# Patient Record
Sex: Female | Born: 1955 | Race: White | Hispanic: No | Marital: Married | Smoking: Current every day smoker
Health system: Southern US, Community
[De-identification: ages and names within clinical notes are randomized; demographics above are authoritative.]

## PROBLEM LIST (undated history)

## (undated) DIAGNOSIS — I10 Essential (primary) hypertension: Secondary | ICD-10-CM

## (undated) DIAGNOSIS — I739 Peripheral vascular disease, unspecified: Secondary | ICD-10-CM

## (undated) DIAGNOSIS — K219 Gastro-esophageal reflux disease without esophagitis: Secondary | ICD-10-CM

## (undated) DIAGNOSIS — R7989 Other specified abnormal findings of blood chemistry: Secondary | ICD-10-CM

## (undated) DIAGNOSIS — E785 Hyperlipidemia, unspecified: Secondary | ICD-10-CM

## (undated) DIAGNOSIS — E7211 Homocystinuria: Secondary | ICD-10-CM

## (undated) HISTORY — DX: Peripheral vascular disease, unspecified: I73.9

## (undated) HISTORY — DX: Essential (primary) hypertension: I10

## (undated) HISTORY — DX: Gastro-esophageal reflux disease without esophagitis: K21.9

## (undated) HISTORY — DX: Hyperlipidemia, unspecified: E78.5

## (undated) HISTORY — PX: BREAST SURGERY: SHX581

## (undated) HISTORY — DX: Homocystinuria: E72.11

## (undated) HISTORY — PX: TUBAL LIGATION: SHX77

## (undated) HISTORY — DX: Other specified abnormal findings of blood chemistry: R79.89

## (undated) HISTORY — PX: CARDIAC VALVE REPLACEMENT: SHX585

---

## 1998-10-08 ENCOUNTER — Encounter: Payer: Self-pay | Admitting: Internal Medicine

## 1998-10-08 ENCOUNTER — Ambulatory Visit (HOSPITAL_COMMUNITY): Admission: RE | Admit: 1998-10-08 | Discharge: 1998-10-08 | Payer: Self-pay | Admitting: Internal Medicine

## 1999-10-01 ENCOUNTER — Other Ambulatory Visit: Admission: RE | Admit: 1999-10-01 | Discharge: 1999-10-01 | Payer: Self-pay | Admitting: Internal Medicine

## 2000-09-09 ENCOUNTER — Other Ambulatory Visit: Admission: RE | Admit: 2000-09-09 | Discharge: 2000-09-09 | Payer: Self-pay | Admitting: Radiology

## 2000-10-19 ENCOUNTER — Ambulatory Visit (HOSPITAL_BASED_OUTPATIENT_CLINIC_OR_DEPARTMENT_OTHER): Admission: RE | Admit: 2000-10-19 | Discharge: 2000-10-19 | Payer: Self-pay | Admitting: *Deleted

## 2000-10-19 ENCOUNTER — Encounter (INDEPENDENT_AMBULATORY_CARE_PROVIDER_SITE_OTHER): Payer: Self-pay | Admitting: *Deleted

## 2000-12-24 ENCOUNTER — Other Ambulatory Visit: Admission: RE | Admit: 2000-12-24 | Discharge: 2000-12-24 | Payer: Self-pay | Admitting: Obstetrics and Gynecology

## 2001-08-18 ENCOUNTER — Encounter: Payer: Self-pay | Admitting: Internal Medicine

## 2001-08-18 ENCOUNTER — Ambulatory Visit (HOSPITAL_COMMUNITY): Admission: RE | Admit: 2001-08-18 | Discharge: 2001-08-18 | Payer: Self-pay | Admitting: Internal Medicine

## 2002-07-18 ENCOUNTER — Other Ambulatory Visit: Admission: RE | Admit: 2002-07-18 | Discharge: 2002-07-18 | Payer: Self-pay | Admitting: Internal Medicine

## 2003-06-16 ENCOUNTER — Encounter: Admission: RE | Admit: 2003-06-16 | Discharge: 2003-06-16 | Payer: Self-pay | Admitting: Internal Medicine

## 2003-06-16 ENCOUNTER — Encounter: Payer: Self-pay | Admitting: Internal Medicine

## 2003-07-20 ENCOUNTER — Other Ambulatory Visit: Admission: RE | Admit: 2003-07-20 | Discharge: 2003-07-20 | Payer: Self-pay | Admitting: Internal Medicine

## 2004-09-23 ENCOUNTER — Other Ambulatory Visit: Admission: RE | Admit: 2004-09-23 | Discharge: 2004-09-23 | Payer: Self-pay | Admitting: Internal Medicine

## 2005-10-14 ENCOUNTER — Other Ambulatory Visit: Admission: RE | Admit: 2005-10-14 | Discharge: 2005-10-14 | Payer: Self-pay | Admitting: Internal Medicine

## 2006-05-13 ENCOUNTER — Encounter (INDEPENDENT_AMBULATORY_CARE_PROVIDER_SITE_OTHER): Payer: Self-pay | Admitting: *Deleted

## 2006-05-13 ENCOUNTER — Ambulatory Visit (HOSPITAL_BASED_OUTPATIENT_CLINIC_OR_DEPARTMENT_OTHER): Admission: RE | Admit: 2006-05-13 | Discharge: 2006-05-13 | Payer: Self-pay | Admitting: General Surgery

## 2006-05-13 ENCOUNTER — Encounter: Admission: RE | Admit: 2006-05-13 | Discharge: 2006-05-13 | Payer: Self-pay | Admitting: General Surgery

## 2006-11-23 ENCOUNTER — Other Ambulatory Visit: Admission: RE | Admit: 2006-11-23 | Discharge: 2006-11-23 | Payer: Self-pay | Admitting: Internal Medicine

## 2007-12-27 ENCOUNTER — Other Ambulatory Visit: Admission: RE | Admit: 2007-12-27 | Discharge: 2007-12-27 | Payer: Self-pay | Admitting: Internal Medicine

## 2008-08-07 ENCOUNTER — Ambulatory Visit: Payer: Self-pay | Admitting: Internal Medicine

## 2008-08-07 ENCOUNTER — Encounter: Admission: RE | Admit: 2008-08-07 | Discharge: 2008-08-07 | Payer: Self-pay | Admitting: Internal Medicine

## 2009-01-02 ENCOUNTER — Ambulatory Visit: Payer: Self-pay | Admitting: Internal Medicine

## 2009-01-02 ENCOUNTER — Other Ambulatory Visit: Admission: RE | Admit: 2009-01-02 | Discharge: 2009-01-02 | Payer: Self-pay | Admitting: Internal Medicine

## 2009-04-03 ENCOUNTER — Ambulatory Visit: Payer: Self-pay | Admitting: Internal Medicine

## 2009-07-24 ENCOUNTER — Ambulatory Visit: Payer: Self-pay | Admitting: Internal Medicine

## 2010-01-04 ENCOUNTER — Ambulatory Visit: Payer: Self-pay | Admitting: Internal Medicine

## 2010-07-15 ENCOUNTER — Ambulatory Visit: Payer: Self-pay | Admitting: Internal Medicine

## 2010-09-30 ENCOUNTER — Ambulatory Visit
Admission: RE | Admit: 2010-09-30 | Discharge: 2010-09-30 | Payer: Self-pay | Source: Home / Self Care | Attending: Internal Medicine | Admitting: Internal Medicine

## 2011-01-17 NOTE — Op Note (Signed)
Kristy Black, STONESIFER                ACCOUNT NO.:  [id number]   MEDICAL RECORD NO.:  [id number]          PATIENT TYPE:  AMB   LOCATION:  DSC                          FACILITY:  [facility]   PHYSICIAN:  Sharlet Salina T. Hoxworth, M.D.DATE OF BIRTH:  [birth year]   DATE OF PROCEDURE:  05/13/2006  DATE OF DISCHARGE:                                 OPERATIVE REPORT   PREOPERATIVE DIAGNOSIS:  Left breast mass.   POSTOPERATIVE DIAGNOSIS:  Left breast mass.   SURGICAL PROCEDURES:  Needle-localized left breast mass excision.   SURGEON:  Lorne Skeens. Hoxworth, M. D.   ANESTHESIA:  Local with IV sedation.   BRIEF HISTORY:  Regina Ganci is a 55 year old female who on a recent  screening mammogram was found to have a new small sub-1 cm mass in the upper  left breast.  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  With this pathology report, we recommended complete excision of  the lesion.  The nature of procedure, indications, risks of bleeding,  infection were discussed and understood.  She was then brought to the  operating room for this procedure.   DESCRIPTION OF OPERATION:  The patient brought to the operating room, placed  in supine position on the operating table following successful needle  localization.  The left breast was sterilely prepped and draped.  The  patient received preoperative antibiotic.  Correct patient and procedure  were verified.  Local anesthesia was used to infiltrate the skin and  underlying breast tissue.  I made a curvilinear incision at the upper  areolar edge and dissection was carried into the subcutaneous tissue.  Dissection was carried up toward the wire which was then brought into the  incision.  Following this a core of breast tissue was excised down around  the shaft of the wire using sharp dissection.  The breast tissue was very  dense but no discrete masses were encountered.  The specimen was removed and  sent for specimen radiograph  which showed the lesion well within the  specimen.  Hemostasis obtained with cautery.  The subcu was reapproximated  with interrupted 4-0 Monocryl.  Skin was closed with running subcuticular 4-  0 Monocryl and Steri-Strips.  Sponge and needle counts were correct.  Dry  sterile dressing applied.  The patient was taken to recovery in good  condition.      Lorne Skeens. Hoxworth, M.D.  Electronically Signed     BTH/MEDQ  D:  05/13/2006  T:  05/14/2006  Job:  [id number]

## 2011-01-17 NOTE — Op Note (Signed)
[place]  Patient:    MOANA, MUNFORD                       MRN: [id number] Proc. Date: 10/19/00 Adm. Date:  [id number] Attending:  Stephenie Acres                           Operative Report  PREOPERATIVE DIAGNOSIS:  Right breast mass.  POSTOPERATIVE DIAGNOSIS:  Right breast mass.  PROCEDURE:  Excisional right breast biopsy.  SURGEON:  Earna Coder, M.D.  ANESTHESIA:  Local MAC.  DESCRIPTION OF PROCEDURE:  The patient was taken to the operating room and placed in a supine position.  After adequate anesthesia was induced using MAC technique, the right breast was prepped and draped in normal sterile fashion. Using 1% lidocaine local anesthesia, the skin overlying the 6 oclock region of the right breast was anesthetized.  Using a curvilinear incision over the 6 oclock region of the right breast, the skin and subcutaneous tissue were anesthetized.  Dissected down, excising the palpable mass in its entirety. Adequate hemostasis was ensured, and the skin was closed with subcuticular 4-0 Monocryl.  A Dermabond dressing was placed.  The patient tolerated the procedure well and went to PACU in good condition. DD:  10/19/00 TD:  10/20/00 Job: [id number]

## 2011-01-17 NOTE — Op Note (Signed)
NAMENAVADA, OSTERHOUT                ACCOUNT NO.:  [id number]   MEDICAL RECORD NO.:  [id number]          PATIENT TYPE:  AMB   LOCATION:  DSC                          FACILITY:  [facility]   PHYSICIAN:  Sharlet Salina T. Hoxworth, M.D.DATE OF BIRTH:  July 04, 1956   DATE OF PROCEDURE:  DATE OF DISCHARGE:                                 OPERATIVE REPORT   Audio too short to transcribe (less than 5 seconds)      Sharlet Salina T. Hoxworth, M.D.     Tory Emerald  D:  05/13/2006  T:  05/13/2006  Job:  [id number]

## 2011-01-20 ENCOUNTER — Other Ambulatory Visit: Payer: 59 | Admitting: *Deleted

## 2011-01-20 DIAGNOSIS — Z0000 Encounter for general adult medical examination without abnormal findings: Secondary | ICD-10-CM

## 2011-01-20 DIAGNOSIS — I10 Essential (primary) hypertension: Secondary | ICD-10-CM

## 2011-01-20 LAB — LIPID PANEL
Cholesterol: 202 mg/dL — ABNORMAL HIGH (ref 0–200)
LDL Cholesterol: 128 mg/dL — ABNORMAL HIGH (ref 0–99)
Total CHOL/HDL Ratio: 6.3 Ratio
VLDL: 42 mg/dL — ABNORMAL HIGH (ref 0–40)

## 2011-01-20 LAB — COMPREHENSIVE METABOLIC PANEL
ALT: 20 U/L (ref 0–35)
AST: 22 U/L (ref 0–37)
Alkaline Phosphatase: 99 U/L (ref 39–117)
BUN: 7 mg/dL (ref 6–23)
Creat: 0.66 mg/dL (ref 0.40–1.20)

## 2011-01-20 LAB — CBC WITH DIFFERENTIAL/PLATELET
Basophils Absolute: 0 10*3/uL (ref 0.0–0.1)
Eosinophils Absolute: 0.1 10*3/uL (ref 0.0–0.7)
Eosinophils Relative: 1 % (ref 0–5)
Lymphocytes Relative: 27 % (ref 12–46)
Lymphs Abs: 3.7 10*3/uL (ref 0.7–4.0)
MCH: 29 pg (ref 26.0–34.0)
MCV: 86.6 fL (ref 78.0–100.0)
Neutrophils Relative %: 66 % (ref 43–77)
Platelets: 375 10*3/uL (ref 150–400)
RBC: 5.38 MIL/uL — ABNORMAL HIGH (ref 3.87–5.11)
RDW: 15.5 % (ref 11.5–15.5)
WBC: 13.7 10*3/uL — ABNORMAL HIGH (ref 4.0–10.5)

## 2011-01-21 ENCOUNTER — Encounter: Payer: Self-pay | Admitting: Internal Medicine

## 2011-01-21 ENCOUNTER — Ambulatory Visit (INDEPENDENT_AMBULATORY_CARE_PROVIDER_SITE_OTHER): Payer: 59 | Admitting: Internal Medicine

## 2011-01-21 VITALS — BP 124/84 | HR 80 | Temp 99.2°F | Ht 67.0 in | Wt 177.0 lb

## 2011-01-21 DIAGNOSIS — Z0000 Encounter for general adult medical examination without abnormal findings: Secondary | ICD-10-CM

## 2011-01-21 DIAGNOSIS — E785 Hyperlipidemia, unspecified: Secondary | ICD-10-CM

## 2011-01-21 DIAGNOSIS — I10 Essential (primary) hypertension: Secondary | ICD-10-CM

## 2011-01-21 LAB — POCT URINALYSIS DIPSTICK
Bilirubin, UA: NEGATIVE
Glucose, UA: NEGATIVE
Ketones, UA: NEGATIVE
Spec Grav, UA: 1.01
pH, UA: 6.5

## 2011-01-21 LAB — VITAMIN D 25 HYDROXY (VIT D DEFICIENCY, FRACTURES): Vit D, 25-Hydroxy: 38 ng/mL (ref 30–89)

## 2011-01-21 NOTE — Patient Instructions (Signed)
Take current meds as directed. Let us know when you would like to have colonoscopy referral. Take Zantac 150 mg over the counter twice a day and continue Miralax. Return in 6 months. Diet and exercise. Lose weight.

## 2011-01-21 NOTE — Progress Notes (Signed)
  Subjective:    Patient ID: Kristy Black, female    DOB: 02-22-1956, 55 y.o.   MRN: [id number]  HPI55 year old White female for Health maintenance.  Exam and evaluation of medical problems. Hx HTN, mild hyperlipidemia, constipation, GE reflux.     Review of Systems  Constitutional: Negative for fever.  HENT: Negative for hearing loss, congestion, sneezing and postnasal drip.   Respiratory: Positive for chest tightness. Negative for shortness of breath.        Has c/o chest pain but has been doing yard work. Smokes 1/2ppd and does not want to quit.  Gastrointestinal: Positive for constipation.       C/o abd bloating after eating. Takes Mirlax daily  Genitourinary: Negative for dysuria, enuresis and difficulty urinating.       Hot flashes q 2 hours hs  Neurological: Negative for seizures, syncope and headaches.  Hematological: Negative.   Psychiatric/Behavioral: The patient is nervous/anxious.        Objective:   Physical Exam  Constitutional: She is oriented to person, place, and time. She appears well-nourished. No distress.  HENT:  Head: Normocephalic and atraumatic.  Right Ear: External ear normal.  Left Ear: External ear normal.  Nose: Nose normal.  Mouth/Throat: No oropharyngeal exudate.  Eyes: Conjunctivae and EOM are normal. Pupils are equal, round, and reactive to light.  Neck: Normal range of motion. Neck supple. No JVD present. No thyromegaly present.  Cardiovascular: Normal rate, regular rhythm and normal heart sounds.   No murmur heard. Pulmonary/Chest: Breath sounds normal. No respiratory distress. She has no rales. She exhibits tenderness.  Abdominal: She exhibits no distension and no mass. There is no tenderness. There is no rebound and no guarding.  Genitourinary: Vagina normal and uterus normal.  Musculoskeletal: She exhibits no edema.  Lymphadenopathy:    She has no cervical adenopathy.  Neurological: She is alert and oriented to person, place, and time.  She has normal reflexes. She displays normal reflexes. No cranial nerve deficit. She exhibits normal muscle tone.  Skin: Skin is warm and dry. No rash noted. No pallor.  Psychiatric: She has a normal mood and affect. Her behavior is normal. Judgment and thought content normal.          Assessment & Plan:  1-HTN- stable on current regimen but admits 1/2 dose of each bp med 2-Mild hyperlipidemia-does not want to be on statin 3-Constipation-controlled with Miralax 3- Chest wall pain-reassure pt 4-Abd bloating after meals- could be reflux, types of foods eaten,anxiety,constipation. Rec Zantac 150 mg bid and cut out foods that cause bloating like nuts and certain cereals RTC in 6 months for BP check and OV

## 2011-06-29 ENCOUNTER — Other Ambulatory Visit: Payer: Self-pay | Admitting: Internal Medicine

## 2011-07-28 ENCOUNTER — Other Ambulatory Visit: Payer: 59 | Admitting: Internal Medicine

## 2011-07-28 DIAGNOSIS — E785 Hyperlipidemia, unspecified: Secondary | ICD-10-CM

## 2011-07-28 LAB — LIPID PANEL
Cholesterol: 220 mg/dL — ABNORMAL HIGH (ref 0–200)
HDL: 36 mg/dL — ABNORMAL LOW (ref >39)
Total CHOL/HDL Ratio: 6.1 Ratio
Triglycerides: 223 mg/dL — ABNORMAL HIGH (ref <150)
VLDL: 45 mg/dL — ABNORMAL HIGH (ref 0–40)

## 2011-07-29 ENCOUNTER — Encounter: Payer: Self-pay | Admitting: Internal Medicine

## 2011-07-29 ENCOUNTER — Ambulatory Visit (INDEPENDENT_AMBULATORY_CARE_PROVIDER_SITE_OTHER): Payer: 59 | Admitting: Internal Medicine

## 2011-07-29 VITALS — BP 136/88 | HR 76 | Temp 98.0°F | Wt 178.0 lb

## 2011-07-29 DIAGNOSIS — I10 Essential (primary) hypertension: Secondary | ICD-10-CM

## 2011-07-29 DIAGNOSIS — K5900 Constipation, unspecified: Secondary | ICD-10-CM

## 2011-07-29 DIAGNOSIS — E785 Hyperlipidemia, unspecified: Secondary | ICD-10-CM

## 2011-07-29 DIAGNOSIS — Z87891 Personal history of nicotine dependence: Secondary | ICD-10-CM

## 2011-07-29 NOTE — Progress Notes (Signed)
  Subjective:    Patient ID: Kristy Black, female    DOB: 06-29-1956, 55 y.o.   MRN: [id number]  HPI 55 year old white female with hypertension and hyperlipidemia. Currently hyperlipidemia is diet controlled at her request. However it has worsened a bit post-Thanksgiving. Also her blood pressure is not as well-controlled as I would like to see at 136/88. She currently is taking one half of Toprol 50 mg every morning and one half of HCTZ 25 mg every morning. She needs to take a whole HCTZ 25 mg tablet and one half of metoprolol 50 mg every morning and perhaps another half of Toprol 50 mg at bedtime. She needs to continue monitoring her blood pressure and keep me posted. For now were going to give her a chance to get back on a strict diet. Says she doesn't have any energy to exercise. Smoking three quarters pack of cigarettes daily. Continues to feel bloated in her abdomen. Doesn't take MiraLAX daily as I have suggested. MiraLAX does relieve some of the bloating when she takes it. 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 See above recommendations for blood pressure control taking 25 mg HCTZ daily instead of 12.5 mg daily. Continue 25 mg of Toprol every morning and perhaps add 25 mg at bedtime. Return in 6 months. Strict diet and exercise for hyperlipidemia control and reassess in 6 months. She is not ready to quit smoking.

## 2011-07-29 NOTE — Patient Instructions (Signed)
Take HCTZ 25 mg daily. Continue with Toprol 50 mg one half tablet daily every morning and perhaps add one half tablet at bedtime if blood pressure does not come down to approximately 120/80 with increasing diuretic dose. Return in 6 months. Strict diet and exercise regimen to control hyperlipidemia and will reevaluate in 6 months.

## 2011-09-23 ENCOUNTER — Other Ambulatory Visit: Payer: Self-pay | Admitting: Internal Medicine

## 2012-01-27 ENCOUNTER — Other Ambulatory Visit: Payer: 59 | Admitting: Internal Medicine

## 2012-01-27 DIAGNOSIS — Z0000 Encounter for general adult medical examination without abnormal findings: Secondary | ICD-10-CM

## 2012-01-27 LAB — COMPREHENSIVE METABOLIC PANEL
Alkaline Phosphatase: 97 U/L (ref 39–117)
Glucose, Bld: 98 mg/dL (ref 70–99)
Sodium: 140 mEq/L (ref 135–145)
Total Bilirubin: 0.5 mg/dL (ref 0.3–1.2)
Total Protein: 6.9 g/dL (ref 6.0–8.3)

## 2012-01-27 LAB — LIPID PANEL
Cholesterol: 197 mg/dL (ref 0–200)
HDL: 30 mg/dL — ABNORMAL LOW (ref >39)
Total CHOL/HDL Ratio: 6.6 Ratio
Triglycerides: 162 mg/dL — ABNORMAL HIGH (ref <150)
VLDL: 32 mg/dL (ref 0–40)

## 2012-01-27 LAB — CBC WITH DIFFERENTIAL/PLATELET
Basophils Relative: 0 % (ref 0–1)
Eosinophils Absolute: 0.1 10*3/uL (ref 0.0–0.7)
MCH: 28.5 pg (ref 26.0–34.0)
MCHC: 33.4 g/dL (ref 30.0–36.0)
Monocytes Relative: 6 % (ref 3–12)
Neutrophils Relative %: 64 % (ref 43–77)
Platelets: 404 10*3/uL — ABNORMAL HIGH (ref 150–400)

## 2012-01-29 ENCOUNTER — Encounter: Payer: Self-pay | Admitting: Internal Medicine

## 2012-01-29 ENCOUNTER — Other Ambulatory Visit (HOSPITAL_COMMUNITY)
Admission: RE | Admit: 2012-01-29 | Discharge: 2012-01-29 | Disposition: A | Discharge status: Home / Self Care | Payer: 59 | Source: Ambulatory Visit | Attending: Internal Medicine | Admitting: Internal Medicine

## 2012-01-29 ENCOUNTER — Ambulatory Visit (INDEPENDENT_AMBULATORY_CARE_PROVIDER_SITE_OTHER): Payer: 59 | Admitting: Internal Medicine

## 2012-01-29 VITALS — BP 136/76 | HR 72 | Temp 97.5°F | Ht 67.25 in | Wt 172.5 lb

## 2012-01-29 DIAGNOSIS — N6019 Diffuse cystic mastopathy of unspecified breast: Secondary | ICD-10-CM

## 2012-01-29 DIAGNOSIS — I10 Essential (primary) hypertension: Secondary | ICD-10-CM

## 2012-01-29 DIAGNOSIS — Z0000 Encounter for general adult medical examination without abnormal findings: Secondary | ICD-10-CM

## 2012-01-29 DIAGNOSIS — Z124 Encounter for screening for malignant neoplasm of cervix: Secondary | ICD-10-CM

## 2012-01-29 DIAGNOSIS — Z01419 Encounter for gynecological examination (general) (routine) without abnormal findings: Secondary | ICD-10-CM | POA: Insufficient documentation

## 2012-01-29 DIAGNOSIS — Z87891 Personal history of nicotine dependence: Secondary | ICD-10-CM

## 2012-01-29 DIAGNOSIS — K219 Gastro-esophageal reflux disease without esophagitis: Secondary | ICD-10-CM

## 2012-01-29 LAB — POCT URINALYSIS DIPSTICK
Ketones, UA: NEGATIVE
Protein, UA: NEGATIVE
Spec Grav, UA: <=1.005
pH, UA: 7

## 2012-02-01 DIAGNOSIS — Z87891 Personal history of nicotine dependence: Secondary | ICD-10-CM | POA: Insufficient documentation

## 2012-02-01 DIAGNOSIS — N6019 Diffuse cystic mastopathy of unspecified breast: Secondary | ICD-10-CM | POA: Insufficient documentation

## 2012-02-01 DIAGNOSIS — K219 Gastro-esophageal reflux disease without esophagitis: Secondary | ICD-10-CM | POA: Insufficient documentation

## 2012-02-01 NOTE — Patient Instructions (Signed)
Continue same medications and return in 6 months for office visit blood pressure check. Consider colonoscopy. Please do 3 Hemoccult cards.

## 2012-02-01 NOTE — Progress Notes (Signed)
Subjective:    Patient ID: Kristy Black, female    DOB: 05-19-1956, 56 y.o.   MRN: [id number]  HPI 56 year old white female with history of hypertension and hyperlipidemia in today for health maintenance exam and evaluation of medical problems. Also has history of GE reflux and constipation. Medications include Toprol, HCTZ, over-the-counter medication for GE reflux. Blood pressure is fairly well controlled. Patient has some history of anxiety. She's had a difficult time raising her daughter. Daughter got involved with a young man who had a drug problem and she became a drug abuser is well. She is currently back home attending school and doing much better.  Social history: Patient married has one daughter. She is employed by [employer]. Is eligible to retire soon. 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 Patient has smoked for over 20 years. Does not consume alcohol.  Family history father with history of diabetes coronary artery disease and hypertension  Mother with history of dementia and hypertension. 2 brothers and 2 sisters. One sister died at age 76 of promyelocytic leukemia.  Additional history: Patient had left fibroadenoma removed in 1985, right fibroadenoma removed 2000. C-section 1991. Bilateral tubal ligation 1995. Benign left breast biopsy in September 2007. Had tetanus immunization through the health department in the fall of 2012.    Review of Systems  Constitutional: Negative.   HENT: Negative.   Eyes: Negative.   Respiratory: Negative.   Cardiovascular: Negative.   Gastrointestinal: Negative.        History of constipation and GE reflux  Genitourinary: Negative.   Musculoskeletal: Negative.   Neurological: Negative.   Hematological: Negative.   Psychiatric/Behavioral: Negative.        Objective:   Physical Exam  Vitals reviewed. Constitutional: She is oriented to person, place, and time. She appears well-developed  and well-nourished. No distress.  HENT:  Head: Normocephalic and atraumatic.  Right Ear: External ear normal.  Left Ear: External ear normal.  Nose: Nose normal.  Mouth/Throat: Oropharynx is clear and moist.  Eyes: Conjunctivae and EOM are normal. Pupils are equal, round, and reactive to light. No scleral icterus.  Neck: Neck supple. No JVD present. No thyromegaly present.  Cardiovascular: Normal rate, regular rhythm, normal heart sounds and intact distal pulses.   No murmur heard. Pulmonary/Chest: Effort normal and breath sounds normal. She has no wheezes. She has no rales.       Breasts normal female  Abdominal: Soft. Bowel sounds are normal. She exhibits no distension and no mass. There is no tenderness. There is no rebound and no guarding.  Genitourinary: Vagina normal and uterus normal.       Pap taken  Musculoskeletal: Normal range of motion. She exhibits no edema and no tenderness.  Lymphadenopathy:    She has no cervical adenopathy.  Neurological: She is alert and oriented to person, place, and time. She has normal reflexes. No cranial nerve deficit. Coordination normal.  Skin: Skin is warm and dry. She is not diaphoretic.  Psychiatric: She has a normal mood and affect. Her behavior is normal. Judgment and thought content normal.          Assessment & Plan:  Hypertension  Increased white blood cell count noted on CBC of 14,000. Sister had history of promyelocytic leukemia. Repeat white blood cell count with peripheral review of smear by hematology staff in 4-6 weeks.  GE reflux  Mild hyperlipidemia  History of constipation  History of fibrocystic breast disease  History of smoking  Plan: Continue same medications return in 6 months for office visit blood pressure check. Also repeat lipid panel at that time.

## 2012-02-17 ENCOUNTER — Other Ambulatory Visit: Payer: Self-pay | Admitting: Internal Medicine

## 2012-02-17 ENCOUNTER — Other Ambulatory Visit: Payer: 59 | Admitting: Internal Medicine

## 2012-02-17 DIAGNOSIS — D72829 Elevated white blood cell count, unspecified: Secondary | ICD-10-CM

## 2012-02-17 LAB — CBC WITH DIFFERENTIAL/PLATELET
Basophils Absolute: 0 10*3/uL (ref 0.0–0.1)
Basophils Relative: 0 % (ref 0–1)
Eosinophils Relative: 1 % (ref 0–5)
HCT: 44.2 % (ref 36.0–46.0)
MCH: 28.5 pg (ref 26.0–34.0)
MCHC: 34.2 g/dL (ref 30.0–36.0)
MCV: 83.4 fL (ref 78.0–100.0)
Monocytes Absolute: 0.9 10*3/uL (ref 0.1–1.0)
RDW: 14.6 % (ref 11.5–15.5)

## 2012-02-26 NOTE — Progress Notes (Signed)
Patient informed. 

## 2012-02-27 ENCOUNTER — Other Ambulatory Visit: Payer: 59 | Admitting: Internal Medicine

## 2012-07-28 ENCOUNTER — Other Ambulatory Visit: Payer: Self-pay | Admitting: Internal Medicine

## 2012-08-02 ENCOUNTER — Other Ambulatory Visit: Payer: Self-pay | Admitting: Internal Medicine

## 2012-08-02 ENCOUNTER — Other Ambulatory Visit: Payer: 59 | Admitting: Internal Medicine

## 2012-08-02 DIAGNOSIS — Z79899 Other long term (current) drug therapy: Secondary | ICD-10-CM

## 2012-08-02 DIAGNOSIS — E785 Hyperlipidemia, unspecified: Secondary | ICD-10-CM

## 2012-08-02 LAB — LIPID PANEL
Cholesterol: 208 mg/dL — ABNORMAL HIGH (ref 0–200)
LDL Cholesterol: 138 mg/dL — ABNORMAL HIGH (ref 0–99)
Total CHOL/HDL Ratio: 6.1 Ratio
VLDL: 36 mg/dL (ref 0–40)

## 2012-08-02 LAB — HEPATIC FUNCTION PANEL
ALT: 19 U/L (ref 0–35)
Bilirubin, Direct: 0.1 mg/dL (ref 0.0–0.3)
Indirect Bilirubin: 0.4 mg/dL (ref 0.0–0.9)
Total Bilirubin: 0.5 mg/dL (ref 0.3–1.2)

## 2012-08-03 ENCOUNTER — Encounter: Payer: Self-pay | Admitting: Internal Medicine

## 2012-08-03 ENCOUNTER — Ambulatory Visit (INDEPENDENT_AMBULATORY_CARE_PROVIDER_SITE_OTHER): Payer: 59 | Admitting: Internal Medicine

## 2012-08-03 VITALS — BP 134/82 | HR 80 | Temp 96.9°F | Wt 175.0 lb

## 2012-08-03 DIAGNOSIS — E785 Hyperlipidemia, unspecified: Secondary | ICD-10-CM

## 2012-08-03 DIAGNOSIS — D72829 Elevated white blood cell count, unspecified: Secondary | ICD-10-CM

## 2012-08-03 DIAGNOSIS — I10 Essential (primary) hypertension: Secondary | ICD-10-CM

## 2012-08-03 DIAGNOSIS — Z23 Encounter for immunization: Secondary | ICD-10-CM

## 2012-08-03 DIAGNOSIS — Z87891 Personal history of nicotine dependence: Secondary | ICD-10-CM

## 2012-08-03 LAB — CBC WITH DIFFERENTIAL/PLATELET
Eosinophils Absolute: 0.1 10*3/uL (ref 0.0–0.7)
Eosinophils Relative: 1 % (ref 0–5)
HCT: 47.1 % — ABNORMAL HIGH (ref 36.0–46.0)
Hemoglobin: 16.3 g/dL — ABNORMAL HIGH (ref 12.0–15.0)
Lymphocytes Relative: 34 % (ref 12–46)
Lymphs Abs: 4 10*3/uL (ref 0.7–4.0)
MCH: 29.6 pg (ref 26.0–34.0)
MCV: 85.5 fL (ref 78.0–100.0)
Monocytes Relative: 5 % (ref 3–12)
Platelets: 381 10*3/uL (ref 150–400)
RBC: 5.51 MIL/uL — ABNORMAL HIGH (ref 3.87–5.11)
WBC: 11.6 10*3/uL — ABNORMAL HIGH (ref 4.0–10.5)

## 2012-08-03 NOTE — Patient Instructions (Addendum)
Watch diet and try to exercise. Consider smoking cessation. Return in 6 months for physical exam. Flu vaccine given today.

## 2012-08-03 NOTE — Progress Notes (Signed)
  Subjective:    Patient ID: Kristy Black, female    DOB: 04/09/1956, 56 y.o.   MRN: [id number]  HPI 56 year old white female smoker in today for six-month recheck of hypertension and hyperlipidemia. 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 However triglycerides have increased just a bit as well as total cholesterol. Blood pressure is under good control on current regimen of one half Toprol daily. Continues to smoke and is not interested in quitting. Has not had influenza immunization so this was done today.  Also she has over the past few years developed a leukocytosis which is unexplained. We've been following it and it runs in the 13,000 range. At last visit we had path review smear and no significant abnormalities were noted. We are going to repeat CBC with differential today.  Total cholesterol is 208 and previously was 197   6 months ago. Triglycerides are 180 and previously were    6 months ago. LDL cholesterol is 138 and previously was 135   6 months ago.    Review of Systems     Objective:   Physical Exam chest is clear to auscultation; cardiac exam regular rate and rhythm; extremities without edema; skin is warm and dry.        Assessment & Plan:  Hyperlipidemia would prefer this to have triglycerides less than 150 and total cholesterol under 200.  Hypertension-would prefer patient to call Toprol 50 mg tablet daily but she prefers to take half tablet daily.  History of smoking-patient not willing to quit  Health maintenance-influenza vaccine given today  Leukocytosis-CBC with differential repeated today  Plan: If white count remains elevated, consider referral to hematology for evaluation. There is no evidence of any sort of infectious process going on . Otherwise return in 6 months for physical examination.

## 2012-08-04 DIAGNOSIS — Z23 Encounter for immunization: Secondary | ICD-10-CM

## 2012-10-09 ENCOUNTER — Other Ambulatory Visit: Payer: Self-pay | Admitting: Internal Medicine

## 2012-10-11 ENCOUNTER — Other Ambulatory Visit: Payer: Self-pay

## 2012-10-11 MED ORDER — METOPROLOL SUCCINATE ER 50 MG PO TB24: 50.0000 mg | ORAL_TABLET | Freq: Every day | ORAL | Status: DC

## 2012-11-22 ENCOUNTER — Other Ambulatory Visit: Payer: Self-pay | Admitting: Internal Medicine

## 2013-01-15 ENCOUNTER — Other Ambulatory Visit: Payer: Self-pay | Admitting: Internal Medicine

## 2013-02-03 ENCOUNTER — Other Ambulatory Visit: Payer: Self-pay | Admitting: Internal Medicine

## 2013-02-03 DIAGNOSIS — Z1329 Encounter for screening for other suspected endocrine disorder: Secondary | ICD-10-CM

## 2013-02-03 DIAGNOSIS — Z0000 Encounter for general adult medical examination without abnormal findings: Secondary | ICD-10-CM

## 2013-02-03 DIAGNOSIS — Z13220 Encounter for screening for lipoid disorders: Secondary | ICD-10-CM

## 2013-02-03 DIAGNOSIS — Z130 Encounter for screening for diseases of the blood and blood-forming organs and certain disorders involving the immune mechanism: Secondary | ICD-10-CM

## 2013-02-03 LAB — LIPID PANEL
HDL: 28 mg/dL — ABNORMAL LOW (ref >39)
LDL Cholesterol: 97 mg/dL (ref 0–99)
Triglycerides: 166 mg/dL — ABNORMAL HIGH (ref <150)
VLDL: 33 mg/dL (ref 0–40)

## 2013-02-03 LAB — CBC WITH DIFFERENTIAL/PLATELET
Basophils Relative: 0 % (ref 0–1)
Eosinophils Absolute: 0.2 10*3/uL (ref 0.0–0.7)
Eosinophils Relative: 2 % (ref 0–5)
HCT: 44.1 % (ref 36.0–46.0)
Hemoglobin: 15.4 g/dL — ABNORMAL HIGH (ref 12.0–15.0)
MCH: 29.2 pg (ref 26.0–34.0)
MCHC: 34.9 g/dL (ref 30.0–36.0)
Monocytes Absolute: 1 10*3/uL (ref 0.1–1.0)
Monocytes Relative: 7 % (ref 3–12)

## 2013-02-03 LAB — COMPREHENSIVE METABOLIC PANEL
Alkaline Phosphatase: 100 U/L (ref 39–117)
BUN: 10 mg/dL (ref 6–23)
Glucose, Bld: 91 mg/dL (ref 70–99)
Total Bilirubin: 0.5 mg/dL (ref 0.3–1.2)

## 2013-02-04 ENCOUNTER — Encounter: Payer: Self-pay | Admitting: Internal Medicine

## 2013-02-04 ENCOUNTER — Ambulatory Visit (INDEPENDENT_AMBULATORY_CARE_PROVIDER_SITE_OTHER): Payer: 59 | Admitting: Internal Medicine

## 2013-02-04 VITALS — BP 110/82 | HR 72 | Ht 67.25 in | Wt 173.0 lb

## 2013-02-04 DIAGNOSIS — Z0000 Encounter for general adult medical examination without abnormal findings: Secondary | ICD-10-CM

## 2013-02-04 DIAGNOSIS — K5900 Constipation, unspecified: Secondary | ICD-10-CM

## 2013-02-04 DIAGNOSIS — N6019 Diffuse cystic mastopathy of unspecified breast: Secondary | ICD-10-CM

## 2013-02-04 DIAGNOSIS — E785 Hyperlipidemia, unspecified: Secondary | ICD-10-CM

## 2013-02-04 DIAGNOSIS — Z87891 Personal history of nicotine dependence: Secondary | ICD-10-CM

## 2013-02-04 DIAGNOSIS — K219 Gastro-esophageal reflux disease without esophagitis: Secondary | ICD-10-CM

## 2013-02-04 DIAGNOSIS — I10 Essential (primary) hypertension: Secondary | ICD-10-CM

## 2013-02-04 NOTE — Patient Instructions (Addendum)
Continue same meds and return in one year. Please stop smoking. Take Miralax for constipation.

## 2013-02-04 NOTE — Progress Notes (Signed)
Subjective:    Patient ID: Kristy Black, female    DOB: [birth year], 57 y.o.   MRN: [id number]  HPI 57 year old White Female with history of hypertension in today for health maintenance and evaluation of medical issues. Patient continues to smoke. Smoking cessation counseling given. 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 Blood pressure is under good control with HCTZ and metoprolol. History of GE reflux. History of hyperlipidemia. Patient has history of anxiety. She's had a difficult time raising her daughter. Her daughter is now in her 59s and while a teenager got involved with a young man who had a drug problem and she became a drug abuser is well. Daughter has been to the [facility] and is on detox therapy. Daughter is working at [employer]. Patient has history of functional constipation.  Past medical history: Patient had left breast fibroadenoma removed in 1985, right breast fibroadenoma removed in 2000. C-section 1991. Bilateral tubal ligation 1995. Benign left breast biopsy September 2007.  Tetanus immunization done through the health department in the fall of 2012.  Social history: She is married and has one daughter. 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 Has smoked for well over 20 years. Does not consume alcohol.  Family history: Father with history of diabetes, coronary artery disease, hypertension. Mother with history of dementia and hypertension. 2 brothers and 2 sisters. One sister died at age 94 of promyelocytic leukemia.    Review of Systems  Constitutional: Negative.   HENT: Negative.   Eyes: Negative.   Gastrointestinal:       History of functional constipation  Endocrine: Negative.   Genitourinary: Negative.   Allergic/Immunologic: Negative.   Neurological: Negative.   Hematological: Negative.   Psychiatric/Behavioral: Negative.        Objective:   Physical Exam  Constitutional: She is  oriented to person, place, and time. She appears well-developed and well-nourished. No distress.  HENT:  Head: Normocephalic and atraumatic.  Right Ear: External ear normal.  Left Ear: External ear normal.  Mouth/Throat: Oropharynx is clear and moist. No oropharyngeal exudate.  Eyes: Conjunctivae and EOM are normal. Pupils are equal, round, and reactive to light. Right eye exhibits no discharge. Left eye exhibits no discharge. No scleral icterus.  Neck: Neck supple. No JVD present. No thyromegaly present.  Cardiovascular: Normal rate, regular rhythm, normal heart sounds and intact distal pulses.   No murmur heard. Pulmonary/Chest: Effort normal and breath sounds normal. No respiratory distress. She has no wheezes. She has no rales.  Abdominal: Soft. Bowel sounds are normal. She exhibits no distension and no mass. There is no rebound and no guarding.  Genitourinary:  Pap done 2013. Bimanual is normal.  Musculoskeletal: Normal range of motion. She exhibits no edema.  Lymphadenopathy:    She has no cervical adenopathy.  Neurological: She is alert and oriented to person, place, and time. She has normal reflexes. No cranial nerve deficit. Coordination normal.  Skin: Skin is warm and dry. No rash noted. She is not diaphoretic.  Psychiatric: She has a normal mood and affect. Her behavior is normal. Judgment and thought content normal.          Assessment & Plan:  Hypertension-stable on HCTZ and beta blocker- continue same medication and return in 6 months  History smoking-smoking cessation counseling given.  Hyperlipidemia-patient does not want to be on statin medication  Functional constipation treated with MiraLAX  History of fibrocystic breast disease  History  of GE reflux treated with over-the-counter medication  Plan: Return in 6 months and continue same medication. Discussed colonoscopy. 3 Hemoccult cards given.

## 2013-02-15 ENCOUNTER — Other Ambulatory Visit: Payer: Self-pay | Admitting: Internal Medicine

## 2013-07-30 ENCOUNTER — Encounter: Payer: Self-pay | Admitting: Internal Medicine

## 2013-09-15 ENCOUNTER — Other Ambulatory Visit: Payer: Self-pay | Admitting: Internal Medicine

## 2014-02-06 ENCOUNTER — Other Ambulatory Visit: Payer: 59 | Admitting: Internal Medicine

## 2014-02-06 DIAGNOSIS — Z1329 Encounter for screening for other suspected endocrine disorder: Secondary | ICD-10-CM

## 2014-02-06 DIAGNOSIS — Z13228 Encounter for screening for other metabolic disorders: Secondary | ICD-10-CM

## 2014-02-06 DIAGNOSIS — E785 Hyperlipidemia, unspecified: Secondary | ICD-10-CM

## 2014-02-06 DIAGNOSIS — I10 Essential (primary) hypertension: Secondary | ICD-10-CM

## 2014-02-06 DIAGNOSIS — Z130 Encounter for screening for diseases of the blood and blood-forming organs and certain disorders involving the immune mechanism: Secondary | ICD-10-CM

## 2014-02-06 LAB — CBC WITH DIFFERENTIAL/PLATELET
BASOS PCT: 1 % (ref 0–1)
Basophils Absolute: 0.1 10*3/uL (ref 0.0–0.1)
Eosinophils Absolute: 0.1 10*3/uL (ref 0.0–0.7)
Eosinophils Relative: 1 % (ref 0–5)
HCT: 45.5 % (ref 36.0–46.0)
Hemoglobin: 15.9 g/dL — ABNORMAL HIGH (ref 12.0–15.0)
Lymphocytes Relative: 29 % (ref 12–46)
Lymphs Abs: 3 10*3/uL (ref 0.7–4.0)
MCH: 29.7 pg (ref 26.0–34.0)
MCHC: 34.9 g/dL (ref 30.0–36.0)
MCV: 84.9 fL (ref 78.0–100.0)
Monocytes Absolute: 0.8 10*3/uL (ref 0.1–1.0)
Monocytes Relative: 8 % (ref 3–12)
NEUTROS PCT: 61 % (ref 43–77)
Neutro Abs: 6.4 10*3/uL (ref 1.7–7.7)
Platelets: 361 10*3/uL (ref 150–400)
RBC: 5.36 MIL/uL — ABNORMAL HIGH (ref 3.87–5.11)
RDW: 14.3 % (ref 11.5–15.5)
WBC: 10.5 10*3/uL (ref 4.0–10.5)

## 2014-02-06 LAB — LIPID PANEL
Cholesterol: 173 mg/dL (ref 0–200)
HDL: 29 mg/dL — ABNORMAL LOW (ref >39)
LDL CALC: 103 mg/dL — AB (ref 0–99)
TRIGLYCERIDES: 207 mg/dL — AB (ref <150)
Total CHOL/HDL Ratio: 6 Ratio
VLDL: 41 mg/dL — ABNORMAL HIGH (ref 0–40)

## 2014-02-06 LAB — COMPREHENSIVE METABOLIC PANEL
ALK PHOS: 95 U/L (ref 39–117)
ALT: 17 U/L (ref 0–35)
AST: 16 U/L (ref 0–37)
Albumin: 4 g/dL (ref 3.5–5.2)
BUN: 7 mg/dL (ref 6–23)
CO2: 27 mEq/L (ref 19–32)
CREATININE: 0.63 mg/dL (ref 0.50–1.10)
Calcium: 9.4 mg/dL (ref 8.4–10.5)
Chloride: 103 mEq/L (ref 96–112)
Glucose, Bld: 96 mg/dL (ref 70–99)
Potassium: 3.7 mEq/L (ref 3.5–5.3)
SODIUM: 138 meq/L (ref 135–145)
TOTAL PROTEIN: 6.6 g/dL (ref 6.0–8.3)
Total Bilirubin: 0.5 mg/dL (ref 0.2–1.2)

## 2014-02-06 LAB — TSH: TSH: 2.07 u[IU]/mL (ref 0.350–4.500)

## 2014-02-07 ENCOUNTER — Encounter: Payer: Self-pay | Admitting: Internal Medicine

## 2014-02-07 ENCOUNTER — Ambulatory Visit (INDEPENDENT_AMBULATORY_CARE_PROVIDER_SITE_OTHER): Payer: 59 | Admitting: Internal Medicine

## 2014-02-07 ENCOUNTER — Other Ambulatory Visit (HOSPITAL_COMMUNITY)
Admission: RE | Admit: 2014-02-07 | Discharge: 2014-02-07 | Disposition: A | Discharge status: Home / Self Care | Payer: 59 | Source: Ambulatory Visit | Attending: Internal Medicine | Admitting: Internal Medicine

## 2014-02-07 VITALS — BP 116/74 | HR 72 | Temp 98.3°F | Ht 67.0 in | Wt 171.0 lb

## 2014-02-07 DIAGNOSIS — K5909 Other constipation: Secondary | ICD-10-CM

## 2014-02-07 DIAGNOSIS — K5904 Chronic idiopathic constipation: Secondary | ICD-10-CM

## 2014-02-07 DIAGNOSIS — L293 Anogenital pruritus, unspecified: Secondary | ICD-10-CM

## 2014-02-07 DIAGNOSIS — I10 Essential (primary) hypertension: Secondary | ICD-10-CM

## 2014-02-07 DIAGNOSIS — K219 Gastro-esophageal reflux disease without esophagitis: Secondary | ICD-10-CM

## 2014-02-07 DIAGNOSIS — E781 Pure hyperglyceridemia: Secondary | ICD-10-CM

## 2014-02-07 DIAGNOSIS — Z0000 Encounter for general adult medical examination without abnormal findings: Secondary | ICD-10-CM

## 2014-02-07 DIAGNOSIS — N898 Other specified noninflammatory disorders of vagina: Secondary | ICD-10-CM

## 2014-02-07 DIAGNOSIS — Z01419 Encounter for gynecological examination (general) (routine) without abnormal findings: Secondary | ICD-10-CM | POA: Insufficient documentation

## 2014-02-07 DIAGNOSIS — R319 Hematuria, unspecified: Secondary | ICD-10-CM

## 2014-02-07 LAB — POCT URINALYSIS DIPSTICK
Bilirubin, UA: NEGATIVE
Glucose, UA: NEGATIVE
KETONES UA: NEGATIVE
LEUKOCYTES UA: NEGATIVE
Nitrite, UA: NEGATIVE
PH UA: 6.5
Protein, UA: NEGATIVE
Spec Grav, UA: 1.01
Urobilinogen, UA: NEGATIVE

## 2014-02-07 LAB — POCT WET PREP (WET MOUNT)

## 2014-02-07 LAB — VITAMIN D 25 HYDROXY (VIT D DEFICIENCY, FRACTURES): VIT D 25 HYDROXY: 37 ng/mL (ref 30–89)

## 2014-02-07 MED ORDER — CLOTRIMAZOLE-BETAMETHASONE 1-0.05 % EX CREA: 1.0000 "application " | TOPICAL_CREAM | Freq: Two times a day (BID) | CUTANEOUS | Status: DC

## 2014-02-07 NOTE — Patient Instructions (Addendum)
Use Lotrisone cream to introitus twice daily. Return in 6 months. Consider smoking cessation. Consider colonoscopy. Watch diet and exercise for hypertriglyceridemia.

## 2014-02-08 LAB — URINALYSIS, MICROSCOPIC ONLY
Bacteria, UA: NONE SEEN
CRYSTALS: NONE SEEN
Casts: NONE SEEN
SQUAMOUS EPITHELIAL / LPF: NONE SEEN

## 2014-02-08 LAB — URINALYSIS, ROUTINE W REFLEX MICROSCOPIC
Bilirubin Urine: NEGATIVE
Glucose, UA: NEGATIVE mg/dL
Ketones, ur: NEGATIVE mg/dL
LEUKOCYTES UA: NEGATIVE
NITRITE: NEGATIVE
PROTEIN: NEGATIVE mg/dL
Specific Gravity, Urine: 1.009 (ref 1.005–1.030)
UROBILINOGEN UA: 0.2 mg/dL (ref 0.0–1.0)
pH: 6.5 (ref 5.0–8.0)

## 2014-02-08 LAB — URINE CULTURE: Colony Count: >=100000

## 2014-02-08 NOTE — Progress Notes (Signed)
Subjective:    Patient ID: Kristy Black, female    DOB: 04-07-1956, 58 y.o.   MRN: [id number]  HPI 58 year old female in today for health maintenance exam and evaluation of medical issues. History of hypertension. History of GE reflux. History of functional constipation which is long-standing treated with MiraLAX. History of anxiety. History of hyperlipidemia.  Past medical history: Long-standing history of smoking and she continues to smoke. Had done smoking cessation counseling previously. Patient does not want to quit. Patient had left breast fibroadenoma removed in 1995, right breast fibroadenoma removed in 2000. C-section 1991. Bilateral tubal ligation 1995. Benign left breast biopsy September 2007.  Tetanus immunization done through the health department in the fall of 2012.  Social history: She is retired from the health department. She is married and has one adult daughter. 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 Patient smoked for well over 20 years and does not consume alcohol.  Family history: Father with history of diabetes coronary artery disease and hypertension who died at age 43 of a sudden MI. Mother age 13 with history of dementia and hyper-tension with congestive heart failure. 2 brothers. One sister died at age 55 of promyelocytic leukemia. One sister living in good health.        Review of Systems  Constitutional: Negative.   Gastrointestinal:       Long-standing history of functional constipation treated with MiraLAX  Genitourinary:       No vaginal dryness. History of itching at the introitus at night  All other systems reviewed and are negative.      Objective:   Physical Exam  Vitals reviewed. Constitutional: She is oriented to person, place, and time. She appears well-developed and well-nourished. No distress.  HENT:  Head: Normocephalic and atraumatic.  Right Ear: External ear normal.  Left Ear: External ear normal.    Mouth/Throat: Oropharynx is clear and moist. No oropharyngeal exudate.  Eyes: Conjunctivae and EOM are normal. Right eye exhibits no discharge. Left eye exhibits no discharge. No scleral icterus.  Neck: Neck supple. No JVD present. No thyromegaly present.  Cardiovascular: Normal rate, regular rhythm, normal heart sounds and intact distal pulses.   No murmur heard. Pulmonary/Chest: Effort normal and breath sounds normal. No respiratory distress. She has no wheezes. She has no rales. She exhibits no tenderness.  Breasts normal female without masses  Abdominal: Soft. Bowel sounds are normal. She exhibits no distension and no mass. There is no tenderness. There is no rebound and no guarding.  Genitourinary:  Pap smear taken. Bimanual exam normal. Wet prep taken.  Musculoskeletal: Normal range of motion. She exhibits no edema.  Lymphadenopathy:    She has no cervical adenopathy.  Neurological: She is alert and oriented to person, place, and time. She has normal reflexes. No cranial nerve deficit. Coordination normal.  Skin: Skin is warm and dry. No rash noted. She is not diaphoretic.  Psychiatric: She has a normal mood and affect. Her behavior is normal. Judgment and thought content normal.          Assessment & Plan:  Abnormal urine dipstick. Showing 2+ occult blood. Have sent urine to [facility] for microscopic urinalysis showing 0-2 red blood cells per high powered field. Feel this is an error with dipstick. Therefore we'll not pursue urology evaluation  Hypertension-stable on current regimen  Functional constipation-stable with MiraLAX  History of smoking-declines efforts quit smoking  Health maintenance-has declined colonoscopy.  GE reflux-stable  Fibrocystic breast disease-has had recent mammogram  Hypertriglyceridemia-recommend diet and exercise  Plan: Return in 6 months for office visit blood pressure check

## 2014-02-09 LAB — CYTOLOGY - PAP

## 2014-03-18 ENCOUNTER — Other Ambulatory Visit: Payer: Self-pay | Admitting: Internal Medicine

## 2014-06-20 ENCOUNTER — Encounter: Payer: Self-pay | Admitting: Internal Medicine

## 2014-07-17 ENCOUNTER — Encounter: Payer: Self-pay | Admitting: Internal Medicine

## 2014-07-17 ENCOUNTER — Ambulatory Visit (INDEPENDENT_AMBULATORY_CARE_PROVIDER_SITE_OTHER): Payer: 59 | Admitting: Internal Medicine

## 2014-07-17 VITALS — BP 118/74 | HR 69 | Temp 97.7°F | Ht 67.0 in | Wt 178.0 lb

## 2014-07-17 DIAGNOSIS — J209 Acute bronchitis, unspecified: Secondary | ICD-10-CM

## 2014-07-17 MED ORDER — HYDROCODONE-HOMATROPINE 5-1.5 MG/5ML PO SYRP: 5.0000 mL | ORAL_SOLUTION | Freq: Three times a day (TID) | ORAL | Status: DC | PRN

## 2014-07-17 MED ORDER — LEVOFLOXACIN 500 MG PO TABS: 500.0000 mg | ORAL_TABLET | Freq: Every day | ORAL | Status: DC

## 2014-07-17 NOTE — Patient Instructions (Addendum)
Take Levaquin for 10 days. Take Hycodan as directed for cough. Call if better

## 2014-07-17 NOTE — Progress Notes (Signed)
   Subjective:    Patient ID: Kristy Black, female    DOB: May 16, 1956, 58 y.o.   MRN: [id number]  HPI  Patient became ill with respiratory infection on November 6. She had some leftover clindamycin tablets and took 4 of them. Initially thought she felt better but then began to feel worse. Started out with scratchy throat and sinus congestion. Now has deep congested cough. Patient is a smoker. Has had discolored nasal drainage and discolored sputum production. Doesn't know she had a fever but has had a great deal of malaise and fatigue. No shortness of breath.    Review of Systems     Objective:   Physical Exam  Skin is warm and dry. Nodes none. TMs are obscured by cerumen. Pharynx is slightly injected without exudate. Neck is supple. Chest clear to auscultation. When she coughs I can hear a bit of a wheeze on the right.      Assessment & Plan:  Acute sinusitis  Acute bronchitis  Plan: Levaquin 500 milligrams daily for 10 days. Hycodan 1 teaspoon by mouth every 6-8 hours when necessary cough. Call if not better in 10 days or sooner if worse.

## 2014-08-16 ENCOUNTER — Telehealth: Payer: Self-pay | Admitting: Internal Medicine

## 2014-08-16 NOTE — Telephone Encounter (Signed)
Still has coughing from November 16 visit. Also has a loose tooth and is to have it removed after the Christmas holidays. Needs refill on clindamycin that dentist had given her in case she develops complications with dental pain/infection during the Christmas holidays. She promises to have tooth extracted after Christmas. Written prescription for clindamycin 150 mg #30 one by mouth 3 times a day.

## 2014-08-28 ENCOUNTER — Encounter: Payer: Self-pay | Admitting: Internal Medicine

## 2014-11-21 ENCOUNTER — Other Ambulatory Visit: Payer: Self-pay | Admitting: Internal Medicine

## 2014-12-12 ENCOUNTER — Encounter: Payer: Self-pay | Admitting: Internal Medicine

## 2014-12-12 ENCOUNTER — Ambulatory Visit (INDEPENDENT_AMBULATORY_CARE_PROVIDER_SITE_OTHER): Payer: 59 | Admitting: Internal Medicine

## 2014-12-12 VITALS — BP 120/78 | Temp 98.0°F | Wt 174.0 lb

## 2014-12-12 DIAGNOSIS — R109 Unspecified abdominal pain: Secondary | ICD-10-CM

## 2014-12-12 DIAGNOSIS — M791 Myalgia: Secondary | ICD-10-CM | POA: Diagnosis not present

## 2014-12-12 DIAGNOSIS — R8290 Unspecified abnormal findings in urine: Secondary | ICD-10-CM

## 2014-12-12 DIAGNOSIS — M7918 Myalgia, other site: Secondary | ICD-10-CM

## 2014-12-12 LAB — POCT URINALYSIS DIPSTICK
Bilirubin, UA: NEGATIVE
Glucose, UA: NEGATIVE
Ketones, UA: NEGATIVE
Leukocytes, UA: NEGATIVE
Nitrite, UA: NEGATIVE
PROTEIN UA: NEGATIVE
SPEC GRAV UA: 1.02
UROBILINOGEN UA: NEGATIVE
pH, UA: 7

## 2014-12-12 NOTE — Progress Notes (Signed)
   Subjective:    Patient ID: Kristy Black, female    DOB: [birth year], 57 y.o.   MRN: [id number]  HPI Onset late last week of pain in her left back. No urinary pressure. No burning or stinging with urination. No recollection of any heavy lifting to cause back pain. Patient thought she might have a urinary tract infection. Small amount of blood on urine dipstick. She has a history of this. In June she had positive blood on urine dipstick. Urine was sent for microscopic and only had 0-2 red blood cells per high-powered field.    Review of Systems     Objective:   Physical Exam She is tender over her left lower rib. No CVA tenderness. Has point tenderness over this rib       Assessment & Plan:  Musculoskeletal pain-back  Plan: Patient may take ibuprofen and use heating pad. Culture is pending of urine. Urine microscopic sent.

## 2014-12-12 NOTE — Patient Instructions (Addendum)
Take ibuprofen 800 mg 3 times daily for back pain and use heating pad as needed. Urine sent for microscopic and culture

## 2014-12-14 LAB — URINE CULTURE: Colony Count: 50000

## 2014-12-19 ENCOUNTER — Telehealth: Payer: Self-pay | Admitting: *Deleted

## 2014-12-19 NOTE — Telephone Encounter (Signed)
Reviewed results with patient. 

## 2015-05-05 ENCOUNTER — Other Ambulatory Visit: Payer: Self-pay | Admitting: Internal Medicine

## 2015-05-24 ENCOUNTER — Other Ambulatory Visit: Payer: Self-pay | Admitting: Internal Medicine

## 2015-11-23 ENCOUNTER — Other Ambulatory Visit: Payer: Self-pay | Admitting: Internal Medicine

## 2015-11-24 NOTE — Telephone Encounter (Signed)
Last CPE 2015. Has grandchild on the way. Please call about appt for PE

## 2015-12-11 ENCOUNTER — Telehealth: Payer: Self-pay | Admitting: Internal Medicine

## 2015-12-11 MED ORDER — AZITHROMYCIN 250 MG PO TABS: ORAL_TABLET | ORAL | Status: DC

## 2015-12-11 NOTE — Telephone Encounter (Signed)
Has had sore throat and slight cough with low grade fever.  Had some leftover Clindomycin.  So, she started taking that and it really made her start feeling better, so she feels like this wasn't viral.  However, she only had about 3 days worth (this was from a previous tooth ache she had).  Kristy Black had the baby last Friday, 4/7 and she's thinking the baby MAY get to come home tomorrow.  So, she doesn't want to be spreading these germs all around and be sick.  She wants to know if you will call her a Rx in for the Clindomycin?  She has CPE on 4/24 and is happy to follow this episode up with you at that time if needed.  She just wants to be well for the baby coming home.    Her Rx was written 150mg  qid originally.    Please advise and thanks.

## 2015-12-11 NOTE — Telephone Encounter (Signed)
Z-pak e-scribed to [facility] [address] by [place] per Dr. Verlene Mayer order.    Spoke with patient to advise of this order.  Patient instructed to wear mask for 5 days and wash hands frequently.  Patient will be seen for her annual exam on 4/24 and we'll f/u at that time.  She'll call the office if she needs [place] prior to that.

## 2015-12-11 NOTE — Telephone Encounter (Signed)
Pt.notified

## 2015-12-11 NOTE — Telephone Encounter (Signed)
She is still likely to be contagious since this is probably viral. Babies do not get strep throat. They have immunity from mother but she should wear mask for 5 days and wash hands frequently. Clindamycin is not correct antibiotic for sore throat. We can call in a Z pak for her.

## 2015-12-20 ENCOUNTER — Other Ambulatory Visit: Payer: 59 | Admitting: Internal Medicine

## 2015-12-20 DIAGNOSIS — I10 Essential (primary) hypertension: Secondary | ICD-10-CM

## 2015-12-20 DIAGNOSIS — Z0000 Encounter for general adult medical examination without abnormal findings: Secondary | ICD-10-CM

## 2015-12-20 DIAGNOSIS — Z13220 Encounter for screening for lipoid disorders: Secondary | ICD-10-CM

## 2015-12-20 DIAGNOSIS — Z130 Encounter for screening for diseases of the blood and blood-forming organs and certain disorders involving the immune mechanism: Secondary | ICD-10-CM

## 2015-12-20 DIAGNOSIS — Z1329 Encounter for screening for other suspected endocrine disorder: Secondary | ICD-10-CM

## 2015-12-20 LAB — CBC WITH DIFFERENTIAL/PLATELET
BASOS PCT: 0 %
Basophils Absolute: 0 cells/uL (ref 0–200)
EOS ABS: 131 {cells}/uL (ref 15–500)
Eosinophils Relative: 1 %
HCT: 46.1 % — ABNORMAL HIGH (ref 35.0–45.0)
HEMOGLOBIN: 15.6 g/dL — AB (ref 11.7–15.5)
LYMPHS ABS: 5109 {cells}/uL — AB (ref 850–3900)
Lymphocytes Relative: 39 %
MCH: 28.7 pg (ref 27.0–33.0)
MCHC: 33.8 g/dL (ref 32.0–36.0)
MCV: 84.9 fL (ref 80.0–100.0)
MPV: 9.6 fL (ref 7.5–12.5)
Monocytes Absolute: 917 cells/uL (ref 200–950)
Monocytes Relative: 7 %
NEUTROS ABS: 6943 {cells}/uL (ref 1500–7800)
Neutrophils Relative %: 53 %
Platelets: 305 10*3/uL (ref 140–400)
RBC: 5.43 MIL/uL — ABNORMAL HIGH (ref 3.80–5.10)
RDW: 14.2 % (ref 11.0–15.0)
WBC: 13.1 10*3/uL — ABNORMAL HIGH (ref 3.8–10.8)

## 2015-12-20 LAB — COMPLETE METABOLIC PANEL WITH GFR
ALBUMIN: 4 g/dL (ref 3.6–5.1)
ALK PHOS: 93 U/L (ref 33–130)
ALT: 20 U/L (ref 6–29)
AST: 18 U/L (ref 10–35)
BUN: 12 mg/dL (ref 7–25)
CO2: 27 mmol/L (ref 20–31)
CREATININE: 0.75 mg/dL (ref 0.50–1.05)
Calcium: 9.7 mg/dL (ref 8.6–10.4)
Chloride: 101 mmol/L (ref 98–110)
GFR, EST NON AFRICAN AMERICAN: 89 mL/min (ref >=60)
Glucose, Bld: 86 mg/dL (ref 65–99)
Potassium: 4 mmol/L (ref 3.5–5.3)
Sodium: 140 mmol/L (ref 135–146)
Total Bilirubin: 0.6 mg/dL (ref 0.2–1.2)
Total Protein: 6.9 g/dL (ref 6.1–8.1)

## 2015-12-20 LAB — LIPID PANEL
Cholesterol: 186 mg/dL (ref 125–200)
HDL: 34 mg/dL — AB (ref >=46)
LDL CALC: 123 mg/dL (ref <130)
TRIGLYCERIDES: 145 mg/dL (ref <150)
Total CHOL/HDL Ratio: 5.5 Ratio — ABNORMAL HIGH (ref <=5.0)
VLDL: 29 mg/dL (ref <30)

## 2015-12-20 LAB — TSH: TSH: 1.46 m[IU]/L

## 2015-12-21 LAB — VITAMIN D 25 HYDROXY (VIT D DEFICIENCY, FRACTURES): Vit D, 25-Hydroxy: 15 ng/mL — ABNORMAL LOW (ref 30–100)

## 2015-12-24 ENCOUNTER — Encounter: Payer: 59 | Admitting: Internal Medicine

## 2016-01-17 ENCOUNTER — Ambulatory Visit (INDEPENDENT_AMBULATORY_CARE_PROVIDER_SITE_OTHER): Payer: 59 | Admitting: Internal Medicine

## 2016-01-17 ENCOUNTER — Encounter: Payer: Self-pay | Admitting: Internal Medicine

## 2016-01-17 VITALS — BP 142/80 | HR 76 | Temp 98.4°F | Resp 18 | Ht 67.0 in | Wt 171.0 lb

## 2016-01-17 DIAGNOSIS — K5904 Chronic idiopathic constipation: Secondary | ICD-10-CM

## 2016-01-17 DIAGNOSIS — Z8659 Personal history of other mental and behavioral disorders: Secondary | ICD-10-CM

## 2016-01-17 DIAGNOSIS — Z0000 Encounter for general adult medical examination without abnormal findings: Secondary | ICD-10-CM

## 2016-01-17 DIAGNOSIS — D72829 Elevated white blood cell count, unspecified: Secondary | ICD-10-CM | POA: Diagnosis not present

## 2016-01-17 DIAGNOSIS — N6019 Diffuse cystic mastopathy of unspecified breast: Secondary | ICD-10-CM

## 2016-01-17 DIAGNOSIS — I10 Essential (primary) hypertension: Secondary | ICD-10-CM

## 2016-01-17 DIAGNOSIS — Z720 Tobacco use: Secondary | ICD-10-CM

## 2016-01-17 DIAGNOSIS — K219 Gastro-esophageal reflux disease without esophagitis: Secondary | ICD-10-CM

## 2016-01-17 DIAGNOSIS — Z87891 Personal history of nicotine dependence: Secondary | ICD-10-CM

## 2016-01-17 MED ORDER — CLOTRIMAZOLE-BETAMETHASONE 1-0.05 % EX CREA: 1.0000 "application " | TOPICAL_CREAM | Freq: Two times a day (BID) | CUTANEOUS | Status: DC

## 2016-01-25 ENCOUNTER — Other Ambulatory Visit: Payer: Self-pay | Admitting: Internal Medicine

## 2016-01-30 NOTE — Progress Notes (Signed)
   Subjective:    Patient ID: Kristy Black, female    DOB: Apr 26, 1956, 60 y.o.   MRN: [id number]  HPI 60 year old White Female smoker with history of hypertension in today for health maintenance exam and evaluation of medical issues. Last physical exam was in 2015. Her daughter delivered a newborn daughter recently. She is living with patient. Patient is very excited about this. She has a long-standing history of constipation treated with MiraLAX. History of anxiety. History of hyperlipidemia. History of GE reflux.  Past medical history: Long-standing history of smoking and continues to smoke. Has done smoking cessation counseling previously. Does not want to quit.   She had left breast fibroadenoma removed in 1995, right breast fibroadenoma removed in 2000, C-section 1991. Bilateral tubal ligation 1995. Benign left breast biopsy 2007.  Tetanus immunization done through the health Department in the Fall of 2012.  Social history: She is retired from the health department. She is married and has one adult daughter. 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 Patient has smoked for well over 20 years and does not consume alcohol.  Family history: Father with history of diabetes, coronary artery disease and hypertension who died at age 63 of a sudden MI. Mother age 64 with history of dementia, hypertension and congestive heart failure. Mother has a pacemaker. 2 brothers. One sister died at age 39 of promyelocytic leukemia. One sister living in good health.    Review of Systems  Constitutional: Negative.   All other systems reviewed and are negative.      Objective:   Physical Exam  Constitutional: She appears well-developed and well-nourished. No distress.  HENT:  Head: Normocephalic and atraumatic.  Right Ear: External ear normal.  Left Ear: External ear normal.  Mouth/Throat: Oropharynx is clear and moist. No oropharyngeal exudate.  Eyes: Conjunctivae and EOM are  normal. Pupils are equal, round, and reactive to light. Right eye exhibits no discharge. Left eye exhibits no discharge. No scleral icterus.  Neck: Neck supple. No JVD present. No thyromegaly present.  Cardiovascular: Normal rate, normal heart sounds and intact distal pulses.   No murmur heard. Pulmonary/Chest: Effort normal and breath sounds normal. No respiratory distress. She has no wheezes. She has no rales.  Breasts normal female with fibrocystic changes  Abdominal: Soft. Bowel sounds are normal. She exhibits no distension and no mass. There is no tenderness. There is no rebound and no guarding.  Genitourinary:  Pap done 2015. Bimanual normal.  Musculoskeletal: She exhibits no edema.  Lymphadenopathy:    She has no cervical adenopathy.  Skin: Skin is warm and dry. No rash noted. She is not diaphoretic.  Psychiatric: She has a normal mood and affect. Her behavior is normal. Thought content normal.  Vitals reviewed.         Assessment & Plan:  Essential hypertension-blood pressure slightly elevated today. She is to check blood pressure at home and call if persistently elevated  History of functional constipation  History of smoking  Fibrocystic breast disease  Health maintenance-has declined colonoscopy  History of hypertriglyceridemia treated with diet and exercise-triglycerides normal with recent lab work.  GE reflux-stable  Vitamin D deficiency-recommend 2000 units vitamin D 3 daily  History of elevated white blood cell count-continued follow-up every 6 months. Sister with history of promyelocytic leukemia  Plan return in 6 months for office visit and blood pressure check

## 2016-01-30 NOTE — Patient Instructions (Signed)
It was a pleasure to see you today. Continue to work on diet exercise and weight loss. Please stop smoking. Take 2000 units vitamin D 3 daily. Return in 6 months for office visit, blood pressure check and vitamin D level as well as CBC

## 2016-03-23 ENCOUNTER — Other Ambulatory Visit: Payer: Self-pay | Admitting: Internal Medicine

## 2016-04-24 ENCOUNTER — Ambulatory Visit (INDEPENDENT_AMBULATORY_CARE_PROVIDER_SITE_OTHER): Payer: 59 | Admitting: Internal Medicine

## 2016-04-24 ENCOUNTER — Encounter: Payer: Self-pay | Admitting: Internal Medicine

## 2016-04-24 VITALS — BP 126/76 | HR 73 | Temp 97.9°F | Ht 67.0 in | Wt 172.5 lb

## 2016-04-24 DIAGNOSIS — M791 Myalgia: Secondary | ICD-10-CM

## 2016-04-24 DIAGNOSIS — R609 Edema, unspecified: Secondary | ICD-10-CM

## 2016-04-24 DIAGNOSIS — I10 Essential (primary) hypertension: Secondary | ICD-10-CM | POA: Diagnosis not present

## 2016-04-24 DIAGNOSIS — M7918 Myalgia, other site: Secondary | ICD-10-CM

## 2016-04-24 DIAGNOSIS — G4762 Sleep related leg cramps: Secondary | ICD-10-CM | POA: Diagnosis not present

## 2016-04-24 DIAGNOSIS — R0602 Shortness of breath: Secondary | ICD-10-CM | POA: Diagnosis not present

## 2016-04-24 NOTE — Patient Instructions (Signed)
Increase HCTZ to 25 mg daily. May continue with one half of a 50 mg Toprol tablet for hypertension. May take magnesium supplement for nocturnal leg cramps. May take in [place] for musculoskeletal pain in legs. Have chest x-ray.

## 2016-04-24 NOTE — Progress Notes (Signed)
   Subjective:    Patient ID: Kristy Black, female    DOB: September 20, 1955, 60 y.o.   MRN: [id number]  HPI 60 year old Female with history of hypertension treated with metoprolol and HCTZ in today with puffiness in her ankles and pain when standing. She has not had issues with this previously. She is on HCTZ but only takes half of a 25 mg tablet, has been prescribed metoprolol 50 mg daily but only takes one half tablet daily. She has concerns that she might have plantar fasciitis. Her feet have been painful when standing on them recently.She was in [place] earlier this summer. It was hot there. She has long-standing history of smoking. Has noticed nocturnal leg cramps. Sometimes has some pain in both shin areas as well as thighs. Doesn't do a lot of walking at work. Mostly sits at a desk. Has some shortness of breath attributed to smoking. Recently started taking magnesium supplement for nocturnal leg cramps. Worried about poor circulation in her legs due to smoking.   Review of Systems as above     Objective:   Physical Exam  she has mild puffiness in both of her feet. Suspect there is edema but it is nonpitting. Dorsalis pedis pulses are present bilaterally. Neck is supple. Chest clear. Cardiac exam regular rate and rhythm.      Assessment & Plan:  Essential hypertension-stable on current regimen  Dependent edema-needs to take 25 mg of HCTZ daily over the next couple of weeks and see if edema improves. She may continue to take one half of a 50 mg Tenormin tablet for nail.  I do not think she has claudication  Nocturnal leg cramps-treat with magnesium 400 mg daily  Musculoskeletal pain in legs-may treat with NSAID   Plan: Patient call with progress report in 2 weeks. I would like for her to have a chest x-ray with long-standing history of smoking

## 2016-04-25 ENCOUNTER — Ambulatory Visit
Admission: RE | Admit: 2016-04-25 | Discharge: 2016-04-25 | Disposition: A | Discharge status: Home / Self Care | Payer: 59 | Source: Ambulatory Visit | Attending: Internal Medicine | Admitting: Internal Medicine

## 2016-04-25 DIAGNOSIS — R0602 Shortness of breath: Secondary | ICD-10-CM

## 2016-08-27 ENCOUNTER — Telehealth: Payer: Self-pay | Admitting: Internal Medicine

## 2016-08-27 ENCOUNTER — Encounter: Payer: Self-pay | Admitting: Internal Medicine

## 2016-08-27 MED ORDER — LEVOFLOXACIN 500 MG PO TABS: 500.0000 mg | ORAL_TABLET | Freq: Every day | ORAL | 0 refills | Status: DC

## 2016-08-27 NOTE — Telephone Encounter (Signed)
Call in Levaquin 500 mg po daily x 10 days for URI/bronchitis symptoms present x 2 weeks

## 2016-09-09 ENCOUNTER — Telehealth: Payer: Self-pay | Admitting: Internal Medicine

## 2016-09-09 MED ORDER — LEVOFLOXACIN 500 MG PO TABS: 500.0000 mg | ORAL_TABLET | Freq: Every day | ORAL | 0 refills | Status: DC

## 2016-09-09 NOTE — Telephone Encounter (Signed)
Patient calling; states that she's been sick since Thanksgiving with URI.  You had offered her the antibiotic on 12/27, and she didn't take it.  She would like an antibiotic now if you will give it to her.  She says as long as she takes a Sudafed and Advil every day, she can survive, but she knows that she cannot continue to do this everyday.  And, she's just not getting better and she's tired of feeling bad.  If she doesn't take the Advil and Sudafed, she does get a fever that creeps up.  What she is coughing up, she believes is from sinus drainage, not from her lungs.  Wants to know if you'll call her in something please?  She also said she doesn't need anything for the cough, just an antibiotic please.    Pharmacy:  [facility] [address]  States you do NOT need to call her back unless there's a problem @ [phone].    Thank you.

## 2016-09-09 NOTE — Telephone Encounter (Signed)
Levaquin sent to pharmacy ?

## 2016-09-09 NOTE — Telephone Encounter (Signed)
Call in Levaquin 500 mg daily x 10 days 

## 2016-09-10 ENCOUNTER — Encounter: Payer: Self-pay | Admitting: Internal Medicine

## 2016-11-21 ENCOUNTER — Other Ambulatory Visit: Payer: Self-pay | Admitting: Internal Medicine

## 2016-11-21 NOTE — Telephone Encounter (Signed)
CPE due after May 18. Please call

## 2017-01-19 ENCOUNTER — Other Ambulatory Visit: Payer: 59 | Admitting: Internal Medicine

## 2017-01-19 DIAGNOSIS — Z1329 Encounter for screening for other suspected endocrine disorder: Secondary | ICD-10-CM

## 2017-01-19 DIAGNOSIS — Z1321 Encounter for screening for nutritional disorder: Secondary | ICD-10-CM

## 2017-01-19 DIAGNOSIS — E7849 Other hyperlipidemia: Secondary | ICD-10-CM

## 2017-01-19 DIAGNOSIS — Z130 Encounter for screening for diseases of the blood and blood-forming organs and certain disorders involving the immune mechanism: Secondary | ICD-10-CM

## 2017-01-19 DIAGNOSIS — Z0000 Encounter for general adult medical examination without abnormal findings: Secondary | ICD-10-CM

## 2017-01-19 LAB — CBC
HEMATOCRIT: 46.9 % — AB (ref 35.0–45.0)
HEMOGLOBIN: 15.7 g/dL — AB (ref 11.7–15.5)
MCH: 29.2 pg (ref 27.0–33.0)
MCHC: 33.5 g/dL (ref 32.0–36.0)
MCV: 87.2 fL (ref 80.0–100.0)
MPV: 9.5 fL (ref 7.5–12.5)
Platelets: 292 10*3/uL (ref 140–400)
RBC: 5.38 MIL/uL — ABNORMAL HIGH (ref 3.80–5.10)
RDW: 14.6 % (ref 11.0–15.0)
WBC: 10.6 10*3/uL (ref 3.8–10.8)

## 2017-01-20 LAB — COMPLETE METABOLIC PANEL WITH GFR
ALBUMIN: 4.2 g/dL (ref 3.6–5.1)
ALK PHOS: 90 U/L (ref 33–130)
ALT: 16 U/L (ref 6–29)
AST: 21 U/L (ref 10–35)
BUN: 11 mg/dL (ref 7–25)
CALCIUM: 9.4 mg/dL (ref 8.6–10.4)
CHLORIDE: 102 mmol/L (ref 98–110)
CO2: 20 mmol/L (ref 20–31)
Creat: 0.69 mg/dL (ref 0.50–1.05)
Glucose, Bld: 95 mg/dL (ref 65–99)
POTASSIUM: 4.6 mmol/L (ref 3.5–5.3)
Sodium: 140 mmol/L (ref 135–146)
Total Bilirubin: 0.6 mg/dL (ref 0.2–1.2)
Total Protein: 6.9 g/dL (ref 6.1–8.1)

## 2017-01-20 LAB — LIPID PANEL
CHOL/HDL RATIO: 6.1 ratio — AB (ref <5.0)
CHOLESTEROL: 188 mg/dL (ref <200)
HDL: 31 mg/dL — AB (ref >50)
LDL Cholesterol: 117 mg/dL — ABNORMAL HIGH (ref <100)
Triglycerides: 202 mg/dL — ABNORMAL HIGH (ref <150)
VLDL: 40 mg/dL — AB (ref <30)

## 2017-01-20 LAB — VITAMIN D 25 HYDROXY (VIT D DEFICIENCY, FRACTURES): VIT D 25 HYDROXY: 21 ng/mL — AB (ref 30–100)

## 2017-01-20 LAB — TSH: TSH: 2.09 mIU/L

## 2017-01-22 ENCOUNTER — Other Ambulatory Visit: Payer: Self-pay | Admitting: Internal Medicine

## 2017-01-22 ENCOUNTER — Other Ambulatory Visit (HOSPITAL_COMMUNITY)
Admission: RE | Admit: 2017-01-22 | Discharge: 2017-01-22 | Disposition: A | Discharge status: Home / Self Care | Payer: 59 | Source: Ambulatory Visit | Attending: Internal Medicine | Admitting: Internal Medicine

## 2017-01-22 ENCOUNTER — Ambulatory Visit (INDEPENDENT_AMBULATORY_CARE_PROVIDER_SITE_OTHER): Payer: 59 | Admitting: Internal Medicine

## 2017-01-22 ENCOUNTER — Encounter: Payer: Self-pay | Admitting: Internal Medicine

## 2017-01-22 VITALS — BP 142/82 | HR 67 | Temp 97.7°F | Ht 67.0 in | Wt 176.0 lb

## 2017-01-22 DIAGNOSIS — E559 Vitamin D deficiency, unspecified: Secondary | ICD-10-CM | POA: Diagnosis not present

## 2017-01-22 DIAGNOSIS — I10 Essential (primary) hypertension: Secondary | ICD-10-CM

## 2017-01-22 DIAGNOSIS — Z01419 Encounter for gynecological examination (general) (routine) without abnormal findings: Secondary | ICD-10-CM | POA: Insufficient documentation

## 2017-01-22 DIAGNOSIS — Z0000 Encounter for general adult medical examination without abnormal findings: Secondary | ICD-10-CM | POA: Diagnosis not present

## 2017-01-22 DIAGNOSIS — K5904 Chronic idiopathic constipation: Secondary | ICD-10-CM | POA: Diagnosis not present

## 2017-01-22 DIAGNOSIS — M791 Myalgia: Secondary | ICD-10-CM | POA: Diagnosis not present

## 2017-01-22 DIAGNOSIS — Z87891 Personal history of nicotine dependence: Secondary | ICD-10-CM | POA: Diagnosis not present

## 2017-01-22 DIAGNOSIS — M7918 Myalgia, other site: Secondary | ICD-10-CM

## 2017-01-22 DIAGNOSIS — E781 Pure hyperglyceridemia: Secondary | ICD-10-CM | POA: Diagnosis not present

## 2017-01-22 DIAGNOSIS — Z8659 Personal history of other mental and behavioral disorders: Secondary | ICD-10-CM | POA: Diagnosis not present

## 2017-01-22 DIAGNOSIS — N6019 Diffuse cystic mastopathy of unspecified breast: Secondary | ICD-10-CM

## 2017-01-22 DIAGNOSIS — K219 Gastro-esophageal reflux disease without esophagitis: Secondary | ICD-10-CM | POA: Diagnosis not present

## 2017-01-22 LAB — POCT URINALYSIS DIPSTICK
BILIRUBIN UA: NEGATIVE
Glucose, UA: NEGATIVE
KETONES UA: NEGATIVE
Leukocytes, UA: NEGATIVE
Nitrite, UA: NEGATIVE
PH UA: 6 (ref 5.0–8.0)
Protein, UA: NEGATIVE
RBC UA: NEGATIVE
SPEC GRAV UA: 1.01 (ref 1.010–1.025)
Urobilinogen, UA: 0.2 E.U./dL

## 2017-01-22 NOTE — Patient Instructions (Signed)
Try magnesium citrate if further issues with constipation lasting more than one day. Watch diet. Try to exercise. See orthopedist regarding hip and right leg pain. Return in 6 months for office visit blood pressure check and fasting lipid panel.

## 2017-01-22 NOTE — Progress Notes (Signed)
Subjective:    Patient ID: Kristy Black, female    DOB: 12-05-1955, 61 y.o.   MRN: [id number]  HPI 61 year old Female For health maintenance exam and evaluation of medical issues. History of smoking and history of hypertension.  She has a long-standing history of constipation treated with MiraLAX. History of anxiety. History of hyperlipidemia. History of GE reflux.  Past medical history: Long-standing history of smoking for well over 20 years and continues to smoke. Not interested in smoking cessation. Has done cessation counseling previously.  She had left breast fibroadenoma removed in 1995, right breast fibroadenoma removed in 2000. C-section 1991. Bilateral tubal ligation 1995. Benign left breast biopsy 2007.  Tetanus immunization done through Health Department Fall 2012.  Social history. She is retired from the health department. She is married she has one adult daughter. 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 One granddaughter.  Family history: Father with history of diabetes, coronary disease, hypertension who died at age 3 of a sudden MI. Mother age 18 with history of dementia, hypertension and congestive heart failure. She has a pacemaker. Patient has 2 brothers. One sister died at age 23 of promyelocytic leukemia. One sister living in good health.      Review of Systems  Constitutional: Negative.   Respiratory: Negative.   Cardiovascular: Negative.   Gastrointestinal:       History of functional constipation long-standing  Genitourinary: Negative.   Musculoskeletal:       Recent issues with right hip pain. Can't walk as far she use to. Feels that hip or leg will give way with her.  Neurological: Negative.   Psychiatric/Behavioral: Negative.        Objective:   Physical Exam  Constitutional: She is oriented to person, place, and time. She appears well-developed and well-nourished. No distress.  HENT:  Head: Normocephalic and atraumatic.   Right Ear: External ear normal.  Left Ear: External ear normal.  Eyes: Conjunctivae and EOM are normal. Pupils are equal, round, and reactive to light. Right eye exhibits no discharge. Left eye exhibits no discharge. No scleral icterus.  Neck: Neck supple. No JVD present. No thyromegaly present.  Cardiovascular: Normal rate, regular rhythm, normal heart sounds and intact distal pulses.   No murmur heard. Pulmonary/Chest: Effort normal and breath sounds normal. No respiratory distress. She has no wheezes. She has no rales. She exhibits no tenderness.  Breasts normal female without masses  Abdominal: Soft. Bowel sounds are normal. She exhibits no distension and no mass. There is no tenderness. There is no rebound and no guarding.  Genitourinary:  Genitourinary Comments: Pap taken. Bimanual normal.  Musculoskeletal: She exhibits no edema.  Lymphadenopathy:    She has no cervical adenopathy.  Neurological: She is alert and oriented to person, place, and time. She has normal reflexes. She displays normal reflexes. No cranial nerve deficit. Coordination normal.  Skin: Skin is warm and dry. No rash noted. She is not diaphoretic.  Psychiatric: She has a normal mood and affect. Her behavior is normal. Judgment and thought content normal.  Vitals reviewed.         Assessment & Plan:  Right hip pain-to see Dr. Ninfa Linden for evaluation  History of smoking-not willing to quit  Situational stress with husband who has kidney mass and needs surgery  Hypertension-continue to monitor  Vitamin D deficiency-take 2000 units vitamin D 3 daily. Level is 21 and 1 year ago was 15  Hyperlipidemia-recheck in 6 months. Encouraged diet exercise  and weight loss. Patient not able to walk as much recently due to hip and right leg muscle issues.  GE reflux treated with OTC medication  Functional constipation treated with MiraLAX  Fibrocystic breast disease  Plan: To see orthopedist for evaluation of hip  pain. Blood pressures elevated today but patient has significant stress. Will continue to monitor blood pressure at home and let me know if persistently elevated. Triglycerides are elevated at 202 and previously were 145 one year ago. She has a family history of leukemia and her sister. We've been monitoring her white count for some time nail. It is normal at 10,600 one year ago was 13,100 with elevated absolutely lymphocytes. Hemoglobin elevated at 15.7 g with history of smoking.

## 2017-01-27 LAB — CYTOLOGY - PAP: Diagnosis: NEGATIVE

## 2017-02-02 ENCOUNTER — Ambulatory Visit (INDEPENDENT_AMBULATORY_CARE_PROVIDER_SITE_OTHER): Payer: 59

## 2017-02-02 ENCOUNTER — Ambulatory Visit (INDEPENDENT_AMBULATORY_CARE_PROVIDER_SITE_OTHER): Payer: 59 | Admitting: Orthopaedic Surgery

## 2017-02-02 DIAGNOSIS — M25551 Pain in right hip: Secondary | ICD-10-CM | POA: Diagnosis not present

## 2017-02-02 MED ORDER — MELOXICAM 7.5 MG PO TABS: 7.5000 mg | ORAL_TABLET | Freq: Two times a day (BID) | ORAL | 3 refills | Status: DC | PRN

## 2017-02-02 MED ORDER — METHYLPREDNISOLONE 4 MG PO TABS: ORAL_TABLET | ORAL | 0 refills | Status: DC

## 2017-02-02 NOTE — Progress Notes (Signed)
Office Visit Note   Patient: Kristy Black           Date of Birth: June 05, 1956           MRN: [id number] Visit Date: 02/02/2017              Requested by: Elby Showers, MD [address] [place], [place] [id number] PCP: Elby Showers, MD   Assessment & Plan: Visit Diagnoses:  1. Pain in right hip     Plan:  I do feel this is more of a trochanter bursitis and IT band syndrome. She does have significantly tight hamstrings well. She showed me how she sits at work I want her to sit differently. I do feel that she'll benefit from a formal physical therapy program see if we get her hamstrings loosened up as well as treat her IT band syndrome and her trochanteric bursitis. Offered injection in this area but she declined. I'll start her on meloxicam as well as the Medrol taper. I'll see her back in 4 weeks to see how she doing overall.  Follow-Up Instructions: Return in about 4 weeks (around 03/02/2017).   Orders:  Orders Placed This Encounter  Procedures  . XR HIP UNILAT W OR W/O PELVIS 1V RIGHT   Meds ordered this encounter  Medications  . methylPREDNISolone (MEDROL) 4 MG tablet    Sig: Medrol dose pack. Take as instructed    Dispense:  21 tablet    Refill:  0  . meloxicam (MOBIC) 7.5 MG tablet    Sig: Take 1 tablet (7.5 mg total) by mouth 2 (two) times daily as needed for pain.    Dispense:  60 tablet    Refill:  3      Procedures: No procedures performed   Clinical Data: No additional findings.   Subjective: No chief complaint on file. The patient is very pleasant 61 year old active female sent from Dr. Renold Genta to evaluate right hip pain. She's been having pain for about a year now with no known injury. It worsens every day. She can barely walk in regards without hurting and she points to the side of her hip and her leg source for pain. He can hurt and sciatic region as well. She denies any groin pain. She denies any significant changes in her activities but does  sit with her leg underneath her other leg a lot at work. She denies any change in bowel or bladder function or specific weakness in her legs. She denies a numbness and tingling as well. It is detrimentally affecting her activities of daily living, her quality of life, and her mobility. It is quite aggravating to her.  HPI  Review of Systems She denies any headache, chest pain, shortness of breath, fever, chills, nausea, vomiting. Objective: Vital Signs: There were no vitals taken for this visit.  Physical Exam  she is alert or 3 and in no acute distress Ortho Exam  examination of her back shows limited flexion extension her lumbar spine just due to stiffness. She cannot reach down to just above her ankles. Her hamstrings are incredibly tight. She has fluid range of motion of both hips. She has 5 out of 5 strength in her bilateral lower extremities. Her pain on the right side is over trochanteric area and IT band deep palpation. She has negative straight leg raise bilaterally. Specialty Comments:  No specialty comments available.  Imaging: Xr Hip Unilat W Or W/o Pelvis 1v Right  Result Date: 02/02/2017 An  AP pelvis lateral right hip show no significant arthritic changes at all in the right hip in no acute findings around any of the bony anatomy that is visible.    PMFS History: Patient Active Problem List   Diagnosis Date Noted  . GE reflux 02/01/2012  . Fibrocystic breast disease 02/01/2012  . History of smoking 02/01/2012  . HTN (hypertension) 01/21/2011  . Hyperlipidemia 01/21/2011   Past Medical History:  Diagnosis Date  . 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 GERD (gastroesophageal reflux disease)   . Hyperlipidemia   . Hypertension     Family History  Problem Relation Age of Onset  . Cancer Sister     Past Surgical History:  Procedure Laterality Date  . BREAST SURGERY     l breast biopsy  . CARDIAC VALVE REPLACEMENT    . CESAREAN SECTION    . TUBAL LIGATION      Social History   Occupational History  . Not on file.   Social History Main Topics  . Smoking status: Current Every Day Smoker    Packs/day: 0.50    Types: Cigarettes  . Smokeless tobacco: Never Used     Comment: 10 cigarettes/day  . Alcohol use Yes     Comment: socially  . Drug use: Unknown  . Sexual activity: Not on file

## 2017-02-03 ENCOUNTER — Other Ambulatory Visit (INDEPENDENT_AMBULATORY_CARE_PROVIDER_SITE_OTHER): Payer: Self-pay

## 2017-02-03 DIAGNOSIS — M7071 Other bursitis of hip, right hip: Secondary | ICD-10-CM

## 2017-02-11 ENCOUNTER — Encounter: Payer: Self-pay | Admitting: Physical Therapy

## 2017-02-11 ENCOUNTER — Ambulatory Visit: Payer: 59 | Attending: Orthopaedic Surgery | Admitting: Physical Therapy

## 2017-02-11 DIAGNOSIS — M25551 Pain in right hip: Secondary | ICD-10-CM | POA: Diagnosis not present

## 2017-02-11 DIAGNOSIS — R262 Difficulty in walking, not elsewhere classified: Secondary | ICD-10-CM | POA: Diagnosis not present

## 2017-02-11 NOTE — Therapy (Signed)
[place], [place], [address] Phone: [phone]   Fax:  [phone]  Physical Therapy Evaluation  Patient Details  Name: Kristy Black MRN: [id number] Date of Birth: [birth year] Referring Provider: Ninfa Linden  Encounter Date: 02/11/2017      PT End of Session - 02/11/17 1053    Visit Number 1   Date for PT Re-Evaluation 04/13/17   PT Start Time 4580   PT Stop Time 1110   PT Time Calculation (min) 55 min   Activity Tolerance Patient tolerated treatment well   Behavior During Therapy [facility] for tasks assessed/performed;Anxious      Past Medical History:  Diagnosis Date  . Elevated homocysteine ([place])   . GERD (gastroesophageal reflux disease)   . Hyperlipidemia   . Hypertension     Past Surgical History:  Procedure Laterality Date  . BREAST SURGERY     l breast biopsy  . CARDIAC VALVE REPLACEMENT    . CESAREAN SECTION    . TUBAL LIGATION      There were no vitals filed for this visit.       Subjective Assessment - 02/11/17 1018    Subjective Patient reports that she started having pain about 8 months ago, she is unsure of a cause.  Reports that she started noticing pain after walking fo an hour 8 months ago, reports now she is limited to 10 minutes.  Reports prednisone has not helped.   Limitations Walking   How long can you walk comfortably? 200 feet, before hurting   Diagnostic tests x-rays negative   Patient Stated Goals have less pain and be able to walk longer and further   Currently in Pain? Yes   Pain Score 0-No pain   Pain Location Hip   Pain Orientation Posterior;Lateral   Pain Descriptors / Indicators Spasm;Sore   Pain Type Acute pain   Pain Onset More than a month ago   Pain Frequency Intermittent   Aggravating Factors  walk 200 feet, right hip pain 9/10   Pain Relieving Factors rest 0/10   Effect of Pain on Daily Activities limits walking            [facility] PT  Assessment - 02/11/17 0001      Assessment   Medical Diagnosis right hip pain   Referring Provider Ninfa Linden   Onset Date/Surgical Date 01/11/17   Prior Therapy no     Precautions   Precautions None     Balance Screen   Has the patient fallen in the past 6 months No   Has the patient had a decrease in activity level because of a fear of falling?  No   Is the patient reluctant to leave their home because of a fear of falling?  No     Home Environment   Additional Comments no stairs, some yardwork, some housework     Prior Function   Level of Independence Independent   Vocation Full time employment   Dietitian   Leisure no exercise     Posture/Postural Control   Posture Comments fwd head, rounded shoulders     ROM / Strength   AROM / PROM / Strength AROM;Strength     AROM   AROM Assessment Site Hip   Right/Left Hip Right   Right Hip Extension 15   Right Hip Flexion 90   Right Hip External Rotation  15   Right Hip Internal Rotation  10  Right Hip ABduction 20     Strength   Strength Assessment Site Hip   Right/Left Hip Right   Right Hip Flexion 3+/5   Right Hip Extension 4+/5   Right Hip External Rotation  3+/5   Right Hip Internal Rotation 3+/5   Right Hip ABduction 3+/5     Flexibility   Soft Tissue Assessment /Muscle Length yes  very tight ITB and HS, mild tightness in the piriformis   Hamstrings 50 degrees SLR     Palpation   Palpation comment very tender to the right GT area, right SI area and some tenderness in the right ischial area     Ambulation/Gait   Gait Comments 200 feet and she starts having right buttock pain, with rest it gets better, after 300 feet pain is up to 9/10 and she has a significant limp and has to stop            Objective measurements completed on examination: See above findings.          [facility] Adult PT Treatment/Exercise - 02/11/17 0001      Modalities   Modalities Electrical  Stimulation;Moist Heat     Moist Heat Therapy   Number Minutes Moist Heat 15 Minutes   Moist Heat Location Hip     Electrical Stimulation   Electrical Stimulation Location right buttock area   Electrical Stimulation Action IFC   Electrical Stimulation Parameters supine   Electrical Stimulation Goals Pain                PT Education - 02/11/17 1052    Education provided Yes   Education Details HEP for HS, ITB and piriformis stretches   Person(s) Educated Patient   Methods Explanation;Demonstration;Handout;Tactile cues;Verbal cues   Comprehension Verbalized understanding          PT Short Term Goals - 02/11/17 1058      PT SHORT TERM GOAL #1   Title independent with initial HEP   Time 2   Period Weeks   Status New           PT Long Term Goals - 02/11/17 1058      PT LONG TERM GOAL #1   Title increase right hip strength to 4/5   Time 8   Period Weeks   Status New     PT LONG TERM GOAL #2   Title decrease pain 50%   Time 8   Period Weeks   Status New     PT LONG TERM GOAL #3   Title tolerate 10 minutes of walking   Time 8   Period Weeks   Status New     PT LONG TERM GOAL #4   Title increase HS flexibility to 70 degrees SLR   Time 8   Period Weeks   Status New                Plan - 02/11/17 1054    Clinical Impression Statement Patient reports right buttock, hip pain about 8 months ago, reports that she started noticing pain after 45 minutes of activity at that time, reports now she has pain up to 9/10 after 200 feet of ambulation.  She is very tender in the right SI, right GT and ischial areas.  She had weakness of the right hip, she was tight in the ITB, HS and piriformis mms.  after 1.5 minutes of walking she had to rest due to pain, after 3 minutes and 3 stops her pain  was a 9/10 and had a significant limp   Clinical Presentation Evolving   Clinical Decision Making Low   Rehab Potential Good   PT Frequency 2x / week   PT Duration  8 weeks   PT Treatment/Interventions ADLs/Self Care Home Management;Cryotherapy;Iontophoresis 4mg /ml Dexamethasone;Electrical Stimulation;Functional mobility training;Gait training;Ultrasound;Traction;Moist Heat;Therapeutic activities;Therapeutic exercise;Neuromuscular re-education;Balance training;Patient/family education;Manual techniques;Dry needling   PT Next Visit Plan assure HEP for flexibility, see if estim helped her at all, could try gym for weakness and flexibility, could try ionto   Consulted and Agree with Plan of Care Patient      Patient will benefit from skilled therapeutic intervention in order to improve the following deficits and impairments:  Abnormal gait, Decreased activity tolerance, Decreased balance, Decreased mobility, Decreased strength, Impaired flexibility, Pain, Increased muscle spasms, Decreased range of motion, Difficulty walking  Visit Diagnosis: Pain in right hip - Plan: PT plan of care cert/re-cert  Difficulty in walking, not elsewhere classified - Plan: PT plan of care cert/re-cert     Problem List Patient Active Problem List   Diagnosis Date Noted  . GE reflux 02/01/2012  . Fibrocystic breast disease 02/01/2012  . History of smoking 02/01/2012  . HTN (hypertension) 01/21/2011  . Hyperlipidemia 01/21/2011    Sumner Boast., PT 02/11/2017, 11:18 AM  [place] [address] [place], [place], [address] Phone: [phone]   Fax:  [phone]  Name: Kristy Black MRN: [id number] Date of Birth: Jan 29, 1956

## 2017-02-13 ENCOUNTER — Encounter: Payer: Self-pay | Admitting: Physical Therapy

## 2017-02-13 ENCOUNTER — Ambulatory Visit: Payer: 59 | Admitting: Physical Therapy

## 2017-02-13 DIAGNOSIS — R262 Difficulty in walking, not elsewhere classified: Secondary | ICD-10-CM

## 2017-02-13 DIAGNOSIS — M25551 Pain in right hip: Secondary | ICD-10-CM | POA: Diagnosis not present

## 2017-02-13 NOTE — Therapy (Signed)
[place], [place], [address] Phone: [phone]   Fax:  [phone]  Physical Therapy Treatment  Patient Details  Name: Kristy Black MRN: [id number] Date of Birth: [birth year] Referring Provider: Ninfa Linden  Encounter Date: 02/13/2017      PT End of Session - 02/13/17 0925    Visit Number 2   Date for PT Re-Evaluation 04/13/17   PT Start Time 0845   PT Stop Time 0940   PT Time Calculation (min) 55 min   Activity Tolerance Patient tolerated treatment well   Behavior During Therapy [facility] for tasks assessed/performed;Anxious      Past Medical History:  Diagnosis Date  . 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 GERD (gastroesophageal reflux disease)   . Hyperlipidemia   . Hypertension     Past Surgical History:  Procedure Laterality Date  . BREAST SURGERY     l breast biopsy  . CARDIAC VALVE REPLACEMENT    . CESAREAN SECTION    . TUBAL LIGATION      There were no vitals filed for this visit.      Subjective Assessment - 02/13/17 0848    Subjective "About the same" compliant with HEP   Currently in Pain? No/denies   Pain Score 0-No pain                         [facility] Adult PT Treatment/Exercise - 02/13/17 0001      Exercises   Exercises Knee/Hip     Knee/Hip Exercises: Aerobic   Nustep L3 x6 min      Knee/Hip Exercises: Standing   Other Standing Knee Exercises standing march 2x10   R hip pain      Knee/Hip Exercises: Seated   Long Arc Quad 2 sets;Both;10 reps;Weights   Long Arc Quad Limitations 3   Ball Squeeze 2x10   Hamstring Curl 2 sets;Both;15 reps;10 reps   Abduction/Adduction  Both;2 sets;10 reps   Abd/Adduction Limitations blue tband    Sit to [employer] 2 sets;10 reps;without UE support     Modalities   Modalities Electrical Stimulation;Moist Heat     Moist Heat Therapy   Number Minutes Moist Heat 15 Minutes   Moist Heat Location Hip     Electrical  Stimulation   Electrical Stimulation Location right buttock area   Electrical Stimulation Action IFC   Electrical Stimulation Parameters supine   Electrical Stimulation Goals Pain                  PT Short Term Goals - 02/11/17 1058      PT SHORT TERM GOAL #1   Title independent with initial HEP   Time 2   Period Weeks   Status New           PT Long Term Goals - 02/11/17 1058      PT LONG TERM GOAL #1   Title increase right hip strength to 4/5   Time 8   Period Weeks   Status New     PT LONG TERM GOAL #2   Title decrease pain 50%   Time 8   Period Weeks   Status New     PT LONG TERM GOAL #3   Title tolerate 10 minutes of walking   Time 8   Period Weeks   Status New     PT LONG TERM GOAL #4   Title increase HS flexibility to  70 degrees SLR   Time 8   Period Weeks   Status New               Plan - 02/13/17 8657    Clinical Impression Statement Pt tolerated an initial progression to exercises well. Reports no pain with NuStep warm up. Does reports some R glute pain with standing march and seated HS curls with resistance. Offered ionto patch and explained the benefits, pt elected to go home and do research before attempting ionto.   Rehab Potential Good   PT Frequency 2x / week   PT Duration 8 weeks   PT Treatment/Interventions ADLs/Self Care Home Management;Cryotherapy;Iontophoresis 4mg /ml Dexamethasone;Electrical Stimulation;Functional mobility training;Gait training;Ultrasound;Traction;Moist Heat;Therapeutic activities;Therapeutic exercise;Neuromuscular re-education;Balance training;Patient/family education;Manual techniques;Dry needling   PT Next Visit Plan gym interventions for weakness and flexibility, try ionto if pt allows.      Patient will benefit from skilled therapeutic intervention in order to improve the following deficits and impairments:  Abnormal gait, Decreased activity tolerance, Decreased balance, Decreased mobility,  Decreased strength, Impaired flexibility, Pain, Increased muscle spasms, Decreased range of motion, Difficulty walking  Visit Diagnosis: Difficulty in walking, not elsewhere classified     Problem List Patient Active Problem List   Diagnosis Date Noted  . GE reflux 02/01/2012  . Fibrocystic breast disease 02/01/2012  . History of smoking 02/01/2012  . HTN (hypertension) 01/21/2011  . Hyperlipidemia 01/21/2011    Scot Jun, PTA 02/13/2017, 9:28 AM  [place] [address] [place], [place], [address] Phone: [phone]   Fax:  [phone]  Name: Kristy Black MRN: [id number] Date of Birth: 25-Jul-1956

## 2017-02-17 ENCOUNTER — Ambulatory Visit: Payer: 59 | Admitting: Physical Therapy

## 2017-02-17 DIAGNOSIS — M25551 Pain in right hip: Secondary | ICD-10-CM

## 2017-02-17 DIAGNOSIS — R262 Difficulty in walking, not elsewhere classified: Secondary | ICD-10-CM

## 2017-02-17 NOTE — Patient Instructions (Signed)

## 2017-02-17 NOTE — Therapy (Signed)
[place] [address] [place], [place], [address] Phone: [phone]   Fax:  [phone]  Physical Therapy Treatment  Patient Details  Name: Kristy Black MRN: [id number] Date of Birth: 1955-10-04 Referring Provider: Ninfa Linden  Encounter Date: 02/17/2017      PT End of Session - 02/17/17 1318    Visit Number 3   Date for PT Re-Evaluation 04/13/17   PT Start Time 1315   PT Stop Time 1402   PT Time Calculation (min) 47 min   Activity Tolerance Patient tolerated treatment well   Behavior During Therapy [facility] for tasks assessed/performed;Anxious      Past Medical History:  Diagnosis Date  . 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 GERD (gastroesophageal reflux disease)   . Hyperlipidemia   . Hypertension     Past Surgical History:  Procedure Laterality Date  . BREAST SURGERY     l breast biopsy  . CARDIAC VALVE REPLACEMENT    . CESAREAN SECTION    . TUBAL LIGATION      There were no vitals filed for this visit.      Subjective Assessment - 02/17/17 1315    Subjective Pt. noting she has not felt much of a difference in pain levels while walking   Patient Stated Goals have less pain and be able to walk longer and further   Currently in Pain? No/denies   Pain Score 0-No pain  25/10 R hip pain level after walking for greater than 2 min    Multiple Pain Sites No                         [facility] Adult PT Treatment/Exercise - 02/17/17 1327      Knee/Hip Exercises: Stretches   Passive Hamstring Stretch 2 reps;30 seconds   Passive Hamstring Stretch Limitations R with strap   Piriformis Stretch 30 seconds;5 reps   Piriformis Stretch Limitations Seated, KTOS, Figure-4    Other Knee/Hip Stretches R ITB with strap x 30 sec      Knee/Hip Exercises: Aerobic   Nustep L3 x 6 min      Knee/Hip Exercises: Seated   Long Arc Quad Weights;15 reps;1 set;Left;Right   Long Arc Quad Limitations 3; with  adduction ball squeeze      Knee/Hip Exercises: Supine   Bridges with [employer] Both;15 reps  3" hold    Bridges with Clamshell 15 reps  3" hold; with sustained hip abd/ER with green TB      Knee/Hip Exercises: Sidelying   Clams L sidelying R clam shell with yellow TB around knees x 15 reps      Modalities   Modalities Iontophoresis     Iontophoresis   Type of Iontophoresis Dexamethasone   Location R greater trochanter    Dose 68mA-min, 1.33ml    Time 4-6hrs     Manual Therapy   Manual Therapy Soft tissue mobilization;Myofascial release   Manual therapy comments L sidelying with R LE elevated on bolster    Soft tissue mobilization STM to glute complex    Myofascial Release TPR to R glute complex/piriformis; ttp here; noted some relief following                  PT Short Term Goals - 02/17/17 1323      PT SHORT TERM GOAL #1   Title independent with initial HEP   Time 2   Period Weeks  Status On-going           PT Long Term Goals - 02/17/17 1805      PT LONG TERM GOAL #1   Title increase right hip strength to 4/5   Time 8   Period Weeks   Status On-going     PT LONG TERM GOAL #2   Title decrease pain 50%   Time 8   Period Weeks   Status On-going     PT LONG TERM GOAL #3   Title tolerate 10 minutes of walking   Time 8   Period Weeks   Status On-going     PT LONG TERM GOAL #4   Title increase HS flexibility to 70 degrees SLR   Time 8   Period Weeks   Status On-going               Plan - 02/17/17 1326    Clinical Impression Statement Pt. noting she will be gone on 3 wks vacation 6.23.18.  Pt. still ttp over R greater trochanter area today and agreed to try iontophoresis.  Education provided for iontophoresis including contraindications/precautions with pt. verbalizing understanding.  Pt. ttp over R piriformis/glute complex thus STM/TPR to this area with pt. noting some decrease sensitivity here.  Tolerated all stretching and  strengthening activity well today.  Will monitor response to ionto and progress pt. per tolerance in coming visits.   PT Treatment/Interventions ADLs/Self Care Home Management;Cryotherapy;Iontophoresis 4mg /ml Dexamethasone;Electrical Stimulation;Functional mobility training;Gait training;Ultrasound;Traction;Moist Heat;Therapeutic activities;Therapeutic exercise;Neuromuscular re-education;Balance training;Patient/family education;Manual techniques;Dry needling   PT Next Visit Plan Monitor response to ionto; gym interventions for weakness and flexibility      Patient will benefit from skilled therapeutic intervention in order to improve the following deficits and impairments:  Abnormal gait, Decreased activity tolerance, Decreased balance, Decreased mobility, Decreased strength, Impaired flexibility, Pain, Increased muscle spasms, Decreased range of motion, Difficulty walking  Visit Diagnosis: Difficulty in walking, not elsewhere classified  Pain in right hip     Problem List Patient Active Problem List   Diagnosis Date Noted  . GE reflux 02/01/2012  . Fibrocystic breast disease 02/01/2012  . History of smoking 02/01/2012  . HTN (hypertension) 01/21/2011  . Hyperlipidemia 01/21/2011   Bess Harvest, PTA 02/17/17 6:14 PM  [place] [address] [place], [place], [address] Phone: [phone]   Fax:  [phone]  Name: Kristy Black MRN: [id number] Date of Birth: February 06, 1956

## 2017-02-19 ENCOUNTER — Ambulatory Visit: Payer: 59 | Admitting: Physical Therapy

## 2017-02-19 ENCOUNTER — Encounter: Payer: Self-pay | Admitting: Physical Therapy

## 2017-02-19 DIAGNOSIS — M25551 Pain in right hip: Secondary | ICD-10-CM

## 2017-02-19 DIAGNOSIS — R262 Difficulty in walking, not elsewhere classified: Secondary | ICD-10-CM

## 2017-02-19 NOTE — Therapy (Signed)
[place] [address] [place], [place], [address] Phone: [phone]   Fax:  [phone]  Physical Therapy Treatment  Patient Details  Name: Kristy Black MRN: [id number] Date of Birth: August 29, 1956 Referring Provider: Ninfa Linden  Encounter Date: 02/19/2017      PT End of Session - 02/19/17 1429    Visit Number 4   Date for PT Re-Evaluation 04/13/17   PT Start Time 3474   PT Stop Time 1429   PT Time Calculation (min) 44 min   Activity Tolerance Patient tolerated treatment well   Behavior During Therapy [facility] for tasks assessed/performed;Anxious      Past Medical History:  Diagnosis Date  . 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 GERD (gastroesophageal reflux disease)   . Hyperlipidemia   . Hypertension     Past Surgical History:  Procedure Laterality Date  . BREAST SURGERY     l breast biopsy  . CARDIAC VALVE REPLACEMENT    . CESAREAN SECTION    . TUBAL LIGATION      There were no vitals filed for this visit.      Subjective Assessment - 02/19/17 1348    Subjective "Good"   Currently in Pain? No/denies   Pain Score 0-No pain                         [facility] Adult PT Treatment/Exercise - 02/19/17 0001      Knee/Hip Exercises: Aerobic   Elliptical I5 R 5 x 3 min   Stopped to rest at 2:18 due to R hip pain      Knee/Hip Exercises: Machines for Strengthening   Cybex Leg Press 20lb 2x10      Knee/Hip Exercises: Standing   Forward Step Up 2 sets;10 reps;Hand Hold: 0;Step Height: 6"     Knee/Hip Exercises: Supine   Bridges with Ball Squeeze Both;15 reps  2 sec hold   Bridges with Clamshell 2 sets;15 reps;Both  2 sec hold    Straight Leg Raises Both;2 sets;10 reps   Straight Leg Raises Limitations 2     Knee/Hip Exercises: Sidelying   Hip ABduction Right;2 sets;10 reps;5 reps  2nd set x5 no weight   Hip ABduction Limitations 2     Iontophoresis   Type of Iontophoresis Dexamethasone    Location R SI   Dose 44mA-min, 1.6ml    Time 4-6hrs                  PT Short Term Goals - 02/17/17 1323      PT SHORT TERM GOAL #1   Title independent with initial HEP   Time 2   Period Weeks   Status On-going           PT Long Term Goals - 02/17/17 1805      PT LONG TERM GOAL #1   Title increase right hip strength to 4/5   Time 8   Period Weeks   Status On-going     PT LONG TERM GOAL #2   Title decrease pain 50%   Time 8   Period Weeks   Status On-going     PT LONG TERM GOAL #3   Title tolerate 10 minutes of walking   Time 8   Period Weeks   Status On-going     PT LONG TERM GOAL #4   Title increase HS flexibility to 70 degrees SLR   Time 8  Period Weeks   Status On-going               Plan - 02/19/17 1429    Clinical Impression Statement Pt tolerated all stretching and strengthening exercises far. Does reports some R hip/SI pain on elliptical and hip abduction in side lying. Reports muscle burning with second set of leg press and SLR. Again pt verbalizing that she will be leaving for vacation and will return to therapy in three to four weeks,   Rehab Potential Good   PT Frequency 2x / week   PT Duration 8 weeks   PT Next Visit Plan Monitor response to ionto; gym interventions for weakness and flexibility      Patient will benefit from skilled therapeutic intervention in order to improve the following deficits and impairments:  Abnormal gait, Decreased activity tolerance, Decreased balance, Decreased mobility, Decreased strength, Impaired flexibility, Pain, Increased muscle spasms, Decreased range of motion, Difficulty walking  Visit Diagnosis: Difficulty in walking, not elsewhere classified  Pain in right hip     Problem List Patient Active Problem List   Diagnosis Date Noted  . GE reflux 02/01/2012  . Fibrocystic breast disease 02/01/2012  . History of smoking 02/01/2012  . HTN (hypertension) 01/21/2011  . Hyperlipidemia  01/21/2011    Scot Jun, PTA 02/19/2017, 2:32 PM  [place], [place], [address] Phone: [phone]   Fax:  [phone]  Name: DOYCE STONEHOUSE MRN: [id number] Date of Birth: 1955/10/05

## 2017-03-17 ENCOUNTER — Ambulatory Visit (INDEPENDENT_AMBULATORY_CARE_PROVIDER_SITE_OTHER): Payer: 59 | Admitting: Physician Assistant

## 2017-03-17 ENCOUNTER — Ambulatory Visit (INDEPENDENT_AMBULATORY_CARE_PROVIDER_SITE_OTHER): Payer: 59

## 2017-03-17 DIAGNOSIS — M7062 Trochanteric bursitis, left hip: Secondary | ICD-10-CM | POA: Diagnosis not present

## 2017-03-17 DIAGNOSIS — M5416 Radiculopathy, lumbar region: Secondary | ICD-10-CM | POA: Diagnosis not present

## 2017-03-17 DIAGNOSIS — M7061 Trochanteric bursitis, right hip: Secondary | ICD-10-CM | POA: Diagnosis not present

## 2017-03-17 NOTE — Progress Notes (Signed)
Office Visit Note   Patient: Kristy Black           Date of Birth: [birth year]           MRN: [id number] Visit Date: 03/17/2017              Requested by: Elby Showers, MD [address] [place], [place] [address] PCP: Elby Showers, MD   Assessment & Plan: Visit Diagnoses:  1. Radiculopathy, lumbar region   2. Trochanteric bursitis of both hips     Plan: Continue Mobic and stretching. Hold PT for now. Moist heat to lumbar spine.MRI Lumbar spine to r/o HNP.   Follow-Up Instructions: Return for AFTER MRI.   Orders:  Orders Placed This Encounter  Procedures  . XR Lumbar Spine 2-3 Views   No orders of the defined types were placed in this encounter.     Procedures: No procedures performed   Clinical Data: No additional findings.   Subjective: Chief Complaint  Patient presents with  . Right Hip - Pain    HPI Mrs.Deloatch returns today one month follow up right hip pain. She states she gets no relief with Medrol Dosepak or mobility. She also went to physical therapy 4 visits did not help with her pain. In fact her pain is changed now 7 some low back pain with numbness down into the hip and occasionally down into her calf. Pain is only with walking. She denies any calf pain with walking. She notes if she leans over a shopping cart that she is able to walk for a prolonged period of time without significant pain. She notes that the numbness started down the leg to the posterior aspect of her calf at least a week ago. Review of Systems No chest pain, SOB, nausea or vomiting. Positive for low back pain numbness tingling down right leg.  Objective: Vital Signs: There were no vitals taken for this visit.  Physical Exam  Constitutional: She is oriented to person, place, and time. She appears well-developed and well-nourished.  Cardiovascular: Intact distal pulses.   Neurological: She is alert and oriented to person, place, and time.  Psychiatric: She has a normal  mood and affect. Her behavior is normal.    Ortho Exam 5 out of 5 strength throughout the lower extremities. Negative raise against resistance. Tight hamstrings bilaterally negative straight leg raise bilaterally. Tenderness over the trochanteric bursa bilaterally. Tenderness over the mid to upper thoracic spinal column with palpation Specialty Comments:  No specialty comments available.  Imaging: Xr Lumbar Spine 2-3 Views  Result Date: 03/17/2017 AP and lateral spine: No acute fracture. Disc space overall well-maintained. Arthrosclerosis of the aorta is noted. No bony lesions . Normal lordotic curvature is well maintained    PMFS History: Patient Active Problem List   Diagnosis Date Noted  . GE reflux 02/01/2012  . Fibrocystic breast disease 02/01/2012  . History of smoking 02/01/2012  . HTN (hypertension) 01/21/2011  . Hyperlipidemia 01/21/2011   Past Medical History:  Diagnosis Date  . 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 GERD (gastroesophageal reflux disease)   . Hyperlipidemia   . Hypertension     Family History  Problem Relation Age of Onset  . Cancer Sister     Past Surgical History:  Procedure Laterality Date  . BREAST SURGERY     l breast biopsy  . CARDIAC VALVE REPLACEMENT    . CESAREAN SECTION    . TUBAL LIGATION     Social History  Occupational History  . Not on file.   Social History Main Topics  . Smoking status: Current Every Day Smoker    Packs/day: 0.50    Types: Cigarettes  . Smokeless tobacco: Never Used     Comment: 10 cigarettes/day  . Alcohol use Yes     Comment: socially  . Drug use: Unknown  . Sexual activity: Not on file

## 2017-03-17 NOTE — Addendum Note (Signed)
Addended byLaurann Montana on: 03/17/2017 09:46 AM   Modules accepted: Orders

## 2017-03-18 ENCOUNTER — Telehealth: Payer: Self-pay

## 2017-03-19 ENCOUNTER — Ambulatory Visit: Payer: 59 | Admitting: Physical Therapy

## 2017-03-31 ENCOUNTER — Ambulatory Visit (INDEPENDENT_AMBULATORY_CARE_PROVIDER_SITE_OTHER): Payer: 59 | Admitting: Orthopaedic Surgery

## 2017-04-01 ENCOUNTER — Ambulatory Visit
Admission: RE | Admit: 2017-04-01 | Discharge: 2017-04-01 | Disposition: A | Discharge status: Home / Self Care | Payer: 59 | Source: Ambulatory Visit | Attending: Physician Assistant | Admitting: Physician Assistant

## 2017-04-01 DIAGNOSIS — M48061 Spinal stenosis, lumbar region without neurogenic claudication: Secondary | ICD-10-CM | POA: Diagnosis not present

## 2017-04-01 DIAGNOSIS — M5416 Radiculopathy, lumbar region: Secondary | ICD-10-CM

## 2017-04-14 ENCOUNTER — Ambulatory Visit (INDEPENDENT_AMBULATORY_CARE_PROVIDER_SITE_OTHER): Payer: 59 | Admitting: Orthopaedic Surgery

## 2017-04-14 ENCOUNTER — Encounter (INDEPENDENT_AMBULATORY_CARE_PROVIDER_SITE_OTHER): Payer: Self-pay | Admitting: Orthopaedic Surgery

## 2017-04-14 DIAGNOSIS — G8929 Other chronic pain: Secondary | ICD-10-CM | POA: Insufficient documentation

## 2017-04-14 DIAGNOSIS — M5441 Lumbago with sciatica, right side: Secondary | ICD-10-CM | POA: Diagnosis not present

## 2017-04-14 DIAGNOSIS — M5442 Lumbago with sciatica, left side: Secondary | ICD-10-CM | POA: Diagnosis not present

## 2017-04-14 NOTE — Progress Notes (Signed)
Schedule appt to discuss

## 2017-04-14 NOTE — Progress Notes (Signed)
The patient is following up after MRI of her lumbar spine. She was getting pain and a burning sensation down her right sciatic region. The first came to see me thinking this is a hip issue. She has had treatment for trochanteric bursitis but nothing is really help. With complaining of numbness and tingling going down her leg was sent her for an MRI to further evaluate this considering the fact that she is still not pain-free. She is a smoker counseled extensively about smoking. She says this only hurts with weightbearing when she is getting well until walk. She does report occasional wrapping in her calf. If her hip on examination she has no pain with try to stretch. She does have some pain over trochanteric area of the right hip but her hip moves fluidly and normal. I did review x-rays of her hip which were normal and MRI of her lumbar spine is basically normal for age. She does have weak pulses in both feet are warm. They are equal as well.  Orthopedic standpoint there is nothing else that I have to offer for her other than continue stretching of the her hip to treat trochanteric bursitis as well as activity modification and smoking cessation. All questions were encouraged and answered. She'll follow-up as needed.

## 2017-04-17 ENCOUNTER — Encounter: Payer: Self-pay | Admitting: Internal Medicine

## 2017-04-17 ENCOUNTER — Ambulatory Visit (INDEPENDENT_AMBULATORY_CARE_PROVIDER_SITE_OTHER): Payer: 59 | Admitting: Internal Medicine

## 2017-04-17 VITALS — BP 158/86 | HR 79 | Temp 98.6°F | Wt 176.0 lb

## 2017-04-17 DIAGNOSIS — F17200 Nicotine dependence, unspecified, uncomplicated: Secondary | ICD-10-CM

## 2017-04-17 DIAGNOSIS — R6889 Other general symptoms and signs: Secondary | ICD-10-CM | POA: Diagnosis not present

## 2017-04-17 DIAGNOSIS — M79604 Pain in right leg: Secondary | ICD-10-CM | POA: Diagnosis not present

## 2017-04-17 MED ORDER — PREDNISONE 10 MG PO TABS
ORAL_TABLET | ORAL | 0 refills | Status: DC
Start: 2017-04-17 — End: 2017-06-02

## 2017-04-17 NOTE — Progress Notes (Signed)
   Subjective:    Patient ID: Kristy Black, female    DOB: 03-23-1956, 61 y.o.   MRN: [id number]  HPI 61 year old Female smoker for many years continues to complain of right buttock pain and sciatic type pain.  I sent her to Dr. Rush Farmer thinking this was a hip issue. He treated her for trochanteric bursitis but it did not help. She was complaining of numbness and tingling going down her right leg. He sent her for a lumbar MRI. This showed no evidence of lumbar spinal stenosis or disc.  She can only walk for short distance until she becomes symptomatic.  Plain hip x-rays were normal.  It has been noted she has decreased pulses in her feet but both feet are warm to touch.  Orthopedist did not feel any further evaluation was needed on his part.  She's back here to discuss her situation.     Review of Systems see above     Objective:   Physical Exam No pain with internal and external rotation of hip. Muscle strength is normal in the lower extremities.  Decreased pulses in feet       Assessment & Plan:  History of smoking for many years  Decreased pulses  Will refer patient for ABI studies.May try physical therapy. Have placed her on a tapering course of prednisone going from 60 mg to 0 mg over 12 days.  Addendum 04/26/2017: ABI studies are consistent with peripheral artery disease and patient will be referred to vascular surgery

## 2017-04-21 ENCOUNTER — Other Ambulatory Visit: Payer: Self-pay | Admitting: Internal Medicine

## 2017-04-21 DIAGNOSIS — I739 Peripheral vascular disease, unspecified: Secondary | ICD-10-CM

## 2017-04-23 ENCOUNTER — Ambulatory Visit (HOSPITAL_COMMUNITY)
Admission: RE | Admit: 2017-04-23 | Discharge: 2017-04-23 | Disposition: A | Discharge status: Home / Self Care | Payer: 59 | Source: Ambulatory Visit | Attending: Cardiology | Admitting: Cardiology

## 2017-04-23 ENCOUNTER — Other Ambulatory Visit: Payer: Self-pay | Admitting: Internal Medicine

## 2017-04-23 DIAGNOSIS — I708 Atherosclerosis of other arteries: Secondary | ICD-10-CM | POA: Diagnosis not present

## 2017-04-23 DIAGNOSIS — I739 Peripheral vascular disease, unspecified: Secondary | ICD-10-CM | POA: Insufficient documentation

## 2017-04-23 DIAGNOSIS — I700 Atherosclerosis of aorta: Secondary | ICD-10-CM | POA: Insufficient documentation

## 2017-04-23 DIAGNOSIS — I771 Stricture of artery: Secondary | ICD-10-CM | POA: Insufficient documentation

## 2017-04-26 NOTE — Patient Instructions (Addendum)
To have ABI studies in the near future. May try physical therapy. Take prednisone taper to see if that helps.  Addendum: Refer to vascular surgery

## 2017-04-27 ENCOUNTER — Telehealth: Payer: Self-pay | Admitting: Internal Medicine

## 2017-04-27 NOTE — Addendum Note (Signed)
Addended by: Drucilla Schmidt on: 04/27/2017 08:42 AM   Modules accepted: Orders

## 2017-04-27 NOTE — Telephone Encounter (Signed)
Patient called and said that you called her yesterday regarding a vascular appointment.  Her husband has been in the hospital all weekend and will probably be there through at least tomorrow.  She knows that you mentioned an appointment with Dr. Curt Jews, but she's not sure what else you mentioned.    She wants to know if she can speak with you regarding what you called about?  A Vascular testing of some sort?    And, she said if she's not in grave danger, she would rather wait until next week to do anything since her husband has been in the hospital and is still there at the present time.    Thanks.  Best number for contact:  [phone]

## 2017-04-27 NOTE — Telephone Encounter (Signed)
Spoke with patient and advised that Dr. Renold Genta wants her to see Dr. Donnetta Hutching and why.  We will put in the referral for patient and patient will call for appointment.

## 2017-04-27 NOTE — Telephone Encounter (Signed)
The message was left on a voice mail recording. There looks like she has some blockages bilaterally in her arteries supplying her legs. Not sure if this is causing her pain or not. Therefore, would like for Dr. Donnetta Hutching to evaluate her. I think it is best to see a vascular surgeon for an opinion to see if it can be watched or needs some intervention.

## 2017-05-14 ENCOUNTER — Other Ambulatory Visit: Payer: Self-pay | Admitting: Internal Medicine

## 2017-05-23 DIAGNOSIS — Z23 Encounter for immunization: Secondary | ICD-10-CM | POA: Diagnosis not present

## 2017-06-02 ENCOUNTER — Ambulatory Visit (INDEPENDENT_AMBULATORY_CARE_PROVIDER_SITE_OTHER): Payer: 59 | Admitting: Vascular Surgery

## 2017-06-02 ENCOUNTER — Encounter: Payer: Self-pay | Admitting: Vascular Surgery

## 2017-06-02 VITALS — BP 143/75 | HR 77 | Resp 16 | Ht 68.5 in | Wt 175.0 lb

## 2017-06-02 DIAGNOSIS — I70213 Atherosclerosis of native arteries of extremities with intermittent claudication, bilateral legs: Secondary | ICD-10-CM

## 2017-06-02 NOTE — Progress Notes (Signed)
Thanks so much. 

## 2017-06-02 NOTE — Progress Notes (Signed)
[facility]  Patient name: Kristy Black MRN: [id number] DOB: 1956-01-07 Sex: female  REASON FOR CONSULT: Bilateral intermittent claudication right greater than left  HPI: Kristy Black is a 61 y.o. female, who is in today for discussion of lower from his symptoms. She is very pleasant 61 year old female with the history of pain in her right hip buttock extending down into her leg with walking. She can occasionally have symptoms in a sitting position this is somewhat different than her exercise symptoms. She can have similar symptoms in her left leg but this is markedly worse in her right. Not have any arterial rest pain in her feet and does not have any lower extremity tissue loss. She does have a history of premature atherosclerotic disease with her father having coronary artery stenting at the age of 21. She does not have any history of heart disease and does not have any history of stroke. She is unable to do routine activities such as walking and a shopping mall without leaning on a grocery cart. She does have a long history of cigarette smoking. She does report generalized fatigue and was concerned that this may suggest cardiac disease.  Past Medical History:  Diagnosis Date  . 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 GERD (gastroesophageal reflux disease)   . Hyperlipidemia   . Hypertension     Family History  Problem Relation Age of Onset  . Cancer Sister     SOCIAL HISTORY: Social History   Social History  . Marital status: Married    Spouse name: N/A  . Number of children: N/A  . Years of education: N/A   Occupational History  . Not on file.   Social History Main Topics  . Smoking status: Current Every Day Smoker    Packs/day: 0.50    Types: Cigarettes  . Smokeless tobacco: Never Used     Comment: 10 cigarettes/day  . Alcohol use Yes     Comment: socially  . Drug use: No  . Sexual activity: No   Other  Topics Concern  . Not on file   Social History Narrative  . No narrative on file    Allergies  Allergen Reactions  . Penicillins Hives    Current Outpatient Prescriptions  Medication Sig Dispense Refill  . Cholecalciferol (VITAMIN D3) 2000 units TABS Take 1 tablet by mouth daily.    . hydrochlorothiazide (HYDRODIURIL) 25 MG tablet TAKE ONE TABLET BY MOUTH ONCE DAILY 90 tablet 1  . ibuprofen (ADVIL,MOTRIN) 200 MG tablet Take 200 mg by mouth every 6 (six) hours as needed.    . metoprolol succinate (TOPROL-XL) 50 MG 24 hr tablet TAKE 1 TABLET BY MOUTH ONCE DAILY 90 tablet 1  . clotrimazole-betamethasone (LOTRISONE) cream Apply 1 application topically 2 (two) times daily. (Patient not taking: Reported on 04/17/2017) 30 g 1   No current facility-administered medications for this visit.     REVIEW OF SYSTEMS:  [X]  denotes positive finding, [ ]  denotes negative finding Cardiac  Comments:  Chest pain or chest pressure:    Shortness of breath upon exertion: x   Short of breath when lying flat:    Irregular heart rhythm:        Vascular    Pain in calf, thigh, or hip brought on by ambulation: x   Pain in feet at night that wakes you up from your sleep:     Blood clot in your veins:    Leg swelling:  Pulmonary    Oxygen at home:    Productive cough:     Wheezing:         Neurologic    Sudden weakness in arms or legs:     Sudden numbness in arms or legs:  x   Sudden onset of difficulty speaking or slurred speech:    Temporary loss of vision in one eye:     Problems with dizziness:         Gastrointestinal    Blood in stool:     Vomited blood:         Genitourinary    Burning when urinating:     Blood in urine:        Psychiatric    Major depression:         Hematologic    Bleeding problems:    Problems with blood clotting too easily:        Skin    Rashes or ulcers:        Constitutional    Fever or chills:      PHYSICAL EXAM: Vitals:   06/02/17 1309   BP: (!) 143/75  Pulse: 77  Resp: 16  SpO2: 99%  Weight: 175 lb (79.4 kg)  Height: 5' 8.5" (1.74 m)    GENERAL: The patient is a well-nourished female, in no acute distress. The vital signs are documented above. CARDIOVASCULAR: Carotid arteries without bruits bilaterally. 2+ radial pulses bilaterally. Diminished femoral and absent pedal pulses bilaterally. PULMONARY: There is good air exchange  ABDOMEN: Soft and non-tender  MUSCULOSKELETAL: There are no major deformities or cyanosis. NEUROLOGIC: No focal weakness or paresthesias are detected. SKIN: There are no ulcers or rashes noted. PSYCHIATRIC: The patient has a normal affect.  DATA:  Noninvasive studies from [facility] from 04/23/2017 were reviewed. 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 She does have majority of symptoms on her right leg and some symptoms on her left. Her right leg symptoms are very typical for intermittent claudication based on aortoiliac occlusive disease. I discussed with her the critical importance of absolute smoking cessation. Explained the importance of continued walking program. I did explain the extent that she is unable to tolerate this level claudication would be aortogram with runoff to determine her level occlusive disease. Explained that she has a high likelihood that this could be treated with angioplasty and stenting if she has acceptable lesions in her aorta iliacs system. Explained that the durability for aortoiliac intervention is extremely good. She wishes to attempt smoking cessation and walking. I will see her again in 3 months for continued discussion. Will not plan any repeat noninvasive studies at that time. She will notify should she develop any difficulty in the  interim   Rosetta Posner, MD FACS [facility] Tel [phone] Pager [phone]

## 2017-06-12 ENCOUNTER — Ambulatory Visit (INDEPENDENT_AMBULATORY_CARE_PROVIDER_SITE_OTHER): Payer: 59 | Admitting: Internal Medicine

## 2017-06-12 ENCOUNTER — Encounter: Payer: Self-pay | Admitting: Internal Medicine

## 2017-06-12 VITALS — BP 120/76 | HR 71 | Temp 98.8°F | Wt 175.0 lb

## 2017-06-12 DIAGNOSIS — I739 Peripheral vascular disease, unspecified: Secondary | ICD-10-CM | POA: Diagnosis not present

## 2017-06-12 DIAGNOSIS — F17200 Nicotine dependence, unspecified, uncomplicated: Secondary | ICD-10-CM | POA: Diagnosis not present

## 2017-06-12 DIAGNOSIS — I10 Essential (primary) hypertension: Secondary | ICD-10-CM | POA: Diagnosis not present

## 2017-06-12 DIAGNOSIS — E782 Mixed hyperlipidemia: Secondary | ICD-10-CM

## 2017-06-12 DIAGNOSIS — M7918 Myalgia, other site: Secondary | ICD-10-CM

## 2017-06-12 DIAGNOSIS — R200 Anesthesia of skin: Secondary | ICD-10-CM

## 2017-06-12 MED ORDER — ROSUVASTATIN CALCIUM 5 MG PO TABS: 5.0000 mg | ORAL_TABLET | Freq: Every day | ORAL | 0 refills | Status: DC

## 2017-06-12 NOTE — Patient Instructions (Signed)
Start Crestor 5 mg daily. Trial of prednisone in physical therapy for buttock pain. Consider neurology evaluation for numbness and right leg. Return in 3 months for office visit blood pressure check lipid panel liver functions.

## 2017-06-12 NOTE — Progress Notes (Signed)
   Subjective:    Patient ID: Kristy Black, female    DOB: 04/08/1956, 61 y.o.   MRN: [id number]  HPI Patient in today to discuss peripheral vascular disease. She saw Dr. Donnetta Hutching who counseled her regarding her findings in her lower extremities. He wants to see her again in 3 months. Advised her to quit smoking with which I wholeheartedly agree with. She says she's cut back and is only smoking half as much.  She says that when she turns over in bed at night she is aware that her right leg is numb. I doubt that is coming from peripheral vascular disease. However her MRI of LS-spine did not show spinal stenosis or disc herniation to explain this. She has not had nerve conduction studies.  She only went to physical therapy for couple of times for right buttock pain. Her husband became ill and she did not return. He's better now. She thinks she would like to try physical therapy. She did not take her scribed course of prednisone that I prescribed previously. She agrees to do this in the near future.  With regard to her blood pressure, she says it has been elevated recently and she began to take a whole diuretic tablet and a whole metoprolol tablet. Her blood pressure is excellent today and this regimen and she should continue.  She is willing to try statin medication. I'm starting her on Crestor 5 mg daily and she will follow-up with me in 3 months. She will need lipid panel liver functions and office visit at that time.  With regard to cardiac evaluation and carotid evaluation she will defer that at this time after discussing it with her.  I spent 30 minutes speaking with her about all of these issues today.    Review of Systems     Objective:   Physical Exam Skin warm and dry. Nodes none. Neck is supple without JVD thyromegaly or carotid bruits. Chest clear to auscultation. Cardiac exam regular rate and rhythm normal S1 and S2. Extremities without edema.       Assessment & Plan:  Lower  extremity peripheral vascular disease  History of smoking-his cut down to one half is much  Right buttock pain? Sciatica to try physical therapy and prednisone  Hyperlipidemia-Crestor 5 mg daily and follow-up in 3 months  Hypertension-50 mg of metoprolol daily and HCTZ 25 mg daily. Follow-up in 3 months.

## 2017-06-16 ENCOUNTER — Encounter: Payer: Self-pay | Admitting: Physical Therapy

## 2017-06-16 ENCOUNTER — Ambulatory Visit: Payer: 59 | Attending: Internal Medicine | Admitting: Physical Therapy

## 2017-06-16 DIAGNOSIS — R262 Difficulty in walking, not elsewhere classified: Secondary | ICD-10-CM | POA: Diagnosis not present

## 2017-06-16 DIAGNOSIS — M25551 Pain in right hip: Secondary | ICD-10-CM | POA: Diagnosis present

## 2017-06-16 NOTE — Therapy (Signed)
[facility], [place], [address] Phone: [phone]   Fax:  [phone]  Physical Therapy Evaluation  Patient Details  Name: Kristy Black MRN: [id number] Date of Birth: 1956/01/10 Referring Provider: Tedra Senegal  Encounter Date: 06/16/2017      PT End of Session - 06/16/17 1519    Visit Number 1   Date for PT Re-Evaluation 08/16/17   PT Start Time 6948   PT Stop Time 1538   PT Time Calculation (min) 53 min   Activity Tolerance Patient tolerated treatment well   Behavior During Therapy Anxious      Past Medical History:  Diagnosis Date  . 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 GERD (gastroesophageal reflux disease)   . Hyperlipidemia   . Hypertension     Past Surgical History:  Procedure Laterality Date  . BREAST SURGERY     l breast biopsy  . CARDIAC VALVE REPLACEMENT    . CESAREAN SECTION    . TUBAL LIGATION      There were no vitals filed for this visit.       Subjective Assessment - 06/16/17 1453    Subjective Patient was seen here early in the year we saw her only 4 times before she went on a prolonged vacation.  MRI was mostly negative some mild degenerative changes, she saw vascular specialist secondary to family history of occlusion, she does have issues but overall felt like this may not be what is causing the hip pain.  When we saw her in June she was able to walk >500 feet without much issue, she reports now on flat surface about 200 feet, on an incline now about 60 feet   How long can you walk comfortably? 200 feet, before hurting   Diagnostic tests MRI mild degenerative changes   Currently in Pain? Yes   Pain Score 0-No pain   Pain Location Buttocks   Pain Orientation Right   Pain Descriptors / Indicators Aching   Pain Type Acute pain   Pain Radiating Towards pain going down the right posterior leg   Pain Onset More than a month ago   Pain Frequency Intermittent   Aggravating Factors  pain with walking150 feet right hip pain 9/10   Pain Relieving Factors rest pain is 0/10   Effect of Pain on Daily Activities limits shopping             [facility] PT Assessment - 06/16/17 0001      Assessment   Medical Diagnosis right hip pain, sciatica   Referring Provider Tedra Senegal   Onset Date/Surgical Date 05/17/17   Prior Therapy yes 4 visits     Precautions   Precautions None     Balance Screen   Has the patient fallen in the past 6 months No   Has the patient had a decrease in activity level because of a fear of falling?  No   Is the patient reluctant to leave their home because of a fear of falling?  No     Home Environment   Additional Comments no stairs, some yardwork, some housework     Prior Function   Level of Independence Independent   Vocation Full time employment   Vocation Requirements accounting   Leisure no exercise     Posture/Postural Control   Posture Comments fwd head, rounded shoulders     ROM / Strength   AROM / PROM / Strength AROM  AROM   Overall AROM Comments Lumbar ROM decreased 50% with tightness int he low back   Right Hip Extension 15   Right Hip Flexion 90   Right Hip External Rotation  15   Right Hip Internal Rotation  10   Right Hip ABduction 25     Strength   Right Hip Flexion 3+/5   Right Hip Extension 4+/5   Right Hip External Rotation  3+/5   Right Hip Internal Rotation 3+/5   Right Hip ABduction 3+/5     Flexibility   Soft Tissue Assessment /Muscle Length yes   Hamstrings 50 degrees SLR, pain in the right leg   Piriformis very tight and reports pain in the buttock     Palpation   Palpation comment tender int he right buttock the right ITB and the right groin     Ambulation/Gait   Gait Comments 150 feet and reports pain on level surfaces, on incline reports 60 feet of walking            Objective measurements completed on examination: See above findings.          [facility]  PT Treatment/Exercise - 06/16/17 0001      Modalities   Modalities Traction     Traction   Type of Traction Lumbar   Min (lbs) 40   Hold Time static   Time 10                  PT Short Term Goals - 06/16/17 1522      PT SHORT TERM GOAL #1   Title independent with initial HEP   Time 2   Period Weeks   Status New           PT Long Term Goals - 06/16/17 1522      PT LONG TERM GOAL #1   Title increase right hip strength to 4/5   Time 8   Period Weeks   Status New     PT LONG TERM GOAL #2   Title decrease pain 50%   Time 8   Period Weeks   Status New     PT LONG TERM GOAL #3   Title tolerate 10 minutes of walking   Time 8   Period Weeks   Status New     PT LONG TERM GOAL #4   Title increase HS flexibility to 70 degrees SLR   Time 8   Period Weeks   Status New                Plan - 06/16/17 1519    Clinical Impression Statement Patient very worried about her pain, reports that she want to know why the pain is there, reports that she feels it could be from veins.  Reports certain positions cause numbness in the legs, MRI of the back was mostly negative some mild degenerative changes.  She is tight in the LE mms.  Cannot tolerate 60 feet of walking with incline walking, flat surace walking 150 feet, reports she can tolerate more walking with lwaning on a shopping cart   Clinical Presentation Stable   Clinical Decision Making Low   Rehab Potential Good   PT Frequency 2x / week   PT Duration 8 weeks   PT Treatment/Interventions ADLs/Self Care Home Management;Cryotherapy;Iontophoresis 4mg /ml Dexamethasone;Electrical Stimulation;Functional mobility training;Gait training;Ultrasound;Traction;Moist Heat;Therapeutic activities;Therapeutic exercise;Neuromuscular re-education;Balance training;Patient/family education;Manual techniques;Dry needling   PT Next Visit Plan see if traction helped, work on flexibility and pelvic stabilization  Consulted and  Agree with Plan of Care Patient      Patient will benefit from skilled therapeutic intervention in order to improve the following deficits and impairments:  Abnormal gait, Decreased activity tolerance, Decreased balance, Decreased mobility, Decreased strength, Impaired flexibility, Pain, Increased muscle spasms, Decreased range of motion, Difficulty walking  Visit Diagnosis: Difficulty in walking, not elsewhere classified - Plan: PT plan of care cert/re-cert  Pain in right hip - Plan: PT plan of care cert/re-cert     Problem List Patient Active Problem List   Diagnosis Date Noted  . Chronic bilateral low back pain with bilateral sciatica 04/14/2017  . GE reflux 02/01/2012  . Fibrocystic breast disease 02/01/2012  . History of smoking 02/01/2012  . HTN (hypertension) 01/21/2011  . Hyperlipidemia 01/21/2011    Sumner Boast., PT 06/16/2017, 3:30 PM  [place] [address] [place], [place], [address] Phone: [phone]   Fax:  [phone]  Name: VISTA SAWATZKY MRN: [id number] Date of Birth: May 12, 1956

## 2017-06-23 ENCOUNTER — Encounter: Payer: Self-pay | Admitting: Physical Therapy

## 2017-06-23 ENCOUNTER — Ambulatory Visit: Payer: 59 | Admitting: Physical Therapy

## 2017-06-23 DIAGNOSIS — R262 Difficulty in walking, not elsewhere classified: Secondary | ICD-10-CM | POA: Diagnosis not present

## 2017-06-23 DIAGNOSIS — M25551 Pain in right hip: Secondary | ICD-10-CM

## 2017-06-23 NOTE — Therapy (Signed)
[place], [place], [address] Phone: [phone]   Fax:  [phone]  Physical Therapy Treatment  Patient Details  Name: Kristy Black MRN: [id number] Date of Birth: 1955/12/20 Referring Provider: Tedra Senegal  Encounter Date: 06/23/2017      PT End of Session - 06/23/17 1502    Visit Number 2   Date for PT Re-Evaluation 08/16/17   PT Start Time 1443   PT Stop Time 1520   PT Time Calculation (min) 37 min   Activity Tolerance Patient tolerated treatment well   Behavior During Therapy [facility] for tasks assessed/performed      Past Medical History:  Diagnosis Date  . 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 GERD (gastroesophageal reflux disease)   . Hyperlipidemia   . Hypertension     Past Surgical History:  Procedure Laterality Date  . BREAST SURGERY     l breast biopsy  . CARDIAC VALVE REPLACEMENT    . CESAREAN SECTION    . TUBAL LIGATION      There were no vitals filed for this visit.      Subjective Assessment - 06/23/17 1446    Subjective Patient reports that she felt good for 3 days after the traction   Currently in Pain? No/denies                         [facility] Adult PT Treatment/Exercise - 06/23/17 0001      Traction   Type of Traction Lumbar   Min (lbs) 50   Hold Time static   Time 15                PT Education - 06/23/17 1501    Education provided Yes   Education Details Gave education and ideas on how to do self traction at home   Person(s) Educated Patient   Methods Explanation;Demonstration   Comprehension Verbalized understanding;Returned demonstration          PT Short Term Goals - 06/23/17 1504      PT SHORT TERM GOAL #1   Title independent with initial HEP   Status On-going           PT Long Term Goals - 06/16/17 1522      PT LONG TERM GOAL #1   Title increase right hip strength to 4/5   Time 8   Period Weeks   Status New     PT LONG TERM GOAL #2   Title decrease pain 50%   Time 8   Period Weeks   Status New     PT LONG TERM GOAL #3   Title tolerate 10 minutes of walking   Time 8   Period Weeks   Status New     PT LONG TERM GOAL #4   Title increase HS flexibility to 70 degrees SLR   Time 8   Period Weeks   Status New               Plan - 06/23/17 1503    Clinical Impression Statement Patient reported feeling much better after the traction, wanted to know how to do it at home   PT Next Visit Plan she is to try traction at home, I spoke with her about the need for core strength and flexibility   Consulted and Agree with Plan of Care Patient      Patient will benefit from skilled therapeutic  intervention in order to improve the following deficits and impairments:  Abnormal gait, Decreased activity tolerance, Decreased balance, Decreased mobility, Decreased strength, Impaired flexibility, Pain, Increased muscle spasms, Decreased range of motion, Difficulty walking  Visit Diagnosis: Difficulty in walking, not elsewhere classified  Pain in right hip     Problem List Patient Active Problem List   Diagnosis Date Noted  . Chronic bilateral low back pain with bilateral sciatica 04/14/2017  . GE reflux 02/01/2012  . Fibrocystic breast disease 02/01/2012  . History of smoking 02/01/2012  . HTN (hypertension) 01/21/2011  . Hyperlipidemia 01/21/2011    Sumner Boast., PT 06/23/2017, 3:04 PM  [place] [place], [place], [address] Phone: [phone]   Fax:  [phone]  Name: Kristy Black MRN: [id number] Date of Birth: 1956-05-09

## 2017-06-26 ENCOUNTER — Ambulatory Visit: Payer: 59 | Admitting: Physical Therapy

## 2017-06-26 ENCOUNTER — Encounter: Payer: Self-pay | Admitting: Physical Therapy

## 2017-06-26 DIAGNOSIS — R262 Difficulty in walking, not elsewhere classified: Secondary | ICD-10-CM

## 2017-06-26 DIAGNOSIS — M25551 Pain in right hip: Secondary | ICD-10-CM

## 2017-06-26 NOTE — Therapy (Signed)
[place], [place], [address] Phone: [phone]   Fax:  [phone]  Physical Therapy Treatment  Patient Details  Name: Kristy Black MRN: [id number] Date of Birth: 1955-11-13 Referring Provider: Tedra Senegal  Encounter Date: 06/26/2017      PT End of Session - 06/26/17 1049    Visit Number 3   Date for PT Re-Evaluation 08/16/17   PT Start Time 1018   PT Stop Time 1105   PT Time Calculation (min) 47 min   Activity Tolerance Patient tolerated treatment well   Behavior During Therapy [facility] for tasks assessed/performed      Past Medical History:  Diagnosis Date  . 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 GERD (gastroesophageal reflux disease)   . Hyperlipidemia   . Hypertension     Past Surgical History:  Procedure Laterality Date  . BREAST SURGERY     l breast biopsy  . CARDIAC VALVE REPLACEMENT    . CESAREAN SECTION    . TUBAL LIGATION      There were no vitals filed for this visit.      Subjective Assessment - 06/26/17 1047    Subjective Patient continues to report relief iwth traction and has tried the sheet traction I gave her to do at home, reports that his is helping as well, she does not like exercises and expresses that   Currently in Pain? Yes   Pain Score 0-No pain                         [facility] Adult PT Treatment/Exercise - 06/26/17 0001      Therapeutic Activites    Therapeutic Activities ADL's;Lifting   ADL's foot prop   Lifting close to body, no twisting     Traction   Type of Traction Lumbar   Min (lbs) 50   Hold Time static   Time 15                PT Education - 06/26/17 1049    Education provided Yes   Education Details Kegels, TrA exercises, feet on ball K2C, trunk rotation, small bridges and abdominal isometrics   Person(s) Educated Patient   Methods Explanation;Demonstration;Tactile cues;Verbal cues;Handout   Comprehension  Verbalized understanding;Returned demonstration;Verbal cues required          PT Short Term Goals - 06/26/17 1052      PT SHORT TERM GOAL #1   Title independent with initial HEP   Status Achieved           PT Long Term Goals - 06/16/17 1522      PT LONG TERM GOAL #1   Title increase right hip strength to 4/5   Time 8   Period Weeks   Status New     PT LONG TERM GOAL #2   Title decrease pain 50%   Time 8   Period Weeks   Status New     PT LONG TERM GOAL #3   Title tolerate 10 minutes of walking   Time 8   Period Weeks   Status New     PT LONG TERM GOAL #4   Title increase HS flexibility to 70 degrees SLR   Time 8   Period Weeks   Status New               Plan - 06/26/17 1051    Clinical Impression Statement Patient expresses  that she does not like exercise, I gave her exercises today in supine that work the core and she responed well to that.  Still reports very good relief with the traction   PT Next Visit Plan may try to add core stability and go over proper body mechanics   Consulted and Agree with Plan of Care Patient      Patient will benefit from skilled therapeutic intervention in order to improve the following deficits and impairments:  Abnormal gait, Decreased activity tolerance, Decreased balance, Decreased mobility, Decreased strength, Impaired flexibility, Pain, Increased muscle spasms, Decreased range of motion, Difficulty walking  Visit Diagnosis: Difficulty in walking, not elsewhere classified  Pain in right hip     Problem List Patient Active Problem List   Diagnosis Date Noted  . Chronic bilateral low back pain with bilateral sciatica 04/14/2017  . GE reflux 02/01/2012  . Fibrocystic breast disease 02/01/2012  . History of smoking 02/01/2012  . HTN (hypertension) 01/21/2011  . Hyperlipidemia 01/21/2011    Sumner Boast., PT 06/26/2017, 10:52 AM  [facility] [address] [place], [place], [address] Phone: [phone]   Fax:  [phone]  Name: Kristy Black MRN: [id number] Date of Birth: 1956/06/01

## 2017-06-30 ENCOUNTER — Encounter: Payer: Self-pay | Admitting: Physical Therapy

## 2017-06-30 ENCOUNTER — Ambulatory Visit: Payer: 59 | Admitting: Physical Therapy

## 2017-06-30 DIAGNOSIS — R262 Difficulty in walking, not elsewhere classified: Secondary | ICD-10-CM

## 2017-06-30 DIAGNOSIS — M25551 Pain in right hip: Secondary | ICD-10-CM

## 2017-06-30 NOTE — Therapy (Signed)
[place], [place], [address] Phone: [phone]   Fax:  [phone]  Physical Therapy Treatment  Patient Details  Name: Kristy Black MRN: [id number] Date of Birth: [birth year] Referring Provider: Tedra Senegal  Encounter Date: 06/30/2017      PT End of Session - 06/30/17 1333    Visit Number 4   Date for PT Re-Evaluation 08/16/17   PT Start Time 1300   PT Stop Time 1342   PT Time Calculation (min) 42 min   Activity Tolerance Patient tolerated treatment well   Behavior During Therapy [facility] for tasks assessed/performed      Past Medical History:  Diagnosis Date  . 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 GERD (gastroesophageal reflux disease)   . Hyperlipidemia   . Hypertension     Past Surgical History:  Procedure Laterality Date  . BREAST SURGERY     l breast biopsy  . CARDIAC VALVE REPLACEMENT    . CESAREAN SECTION    . TUBAL LIGATION      There were no vitals filed for this visit.      Subjective Assessment - 06/30/17 1303    Subjective "Good"   Currently in Pain? Yes   Pain Location Buttocks   Pain Orientation Right                         [facility] Adult PT Treatment/Exercise - 06/30/17 0001      Exercises   Exercises Lumbar     Lumbar Exercises: Supine   Ab Set 3 seconds;15 reps   Bridge 10 reps;Compliant;2 seconds   Straight Leg Raise 10 reps;2 seconds   Large Ball Oblique Isometric 10 reps;3 seconds;5 reps   Other Supine Lumbar Exercises hip abd 2x5      Knee/Hip Exercises: Supine   Other Supine Knee/Hip Exercises LE on physo ball bridges, K2C, blliq x15 each      Traction   Type of Traction Lumbar   Min (lbs) 55   Hold Time static   Time 15                  PT Short Term Goals - 06/26/17 1052      PT SHORT TERM GOAL #1   Title independent with initial HEP   Status Achieved           PT Long Term Goals - 06/16/17 1522       PT LONG TERM GOAL #1   Title increase right hip strength to 4/5   Time 8   Period Weeks   Status New     PT LONG TERM GOAL #2   Title decrease pain 50%   Time 8   Period Weeks   Status New     PT LONG TERM GOAL #3   Title tolerate 10 minutes of walking   Time 8   Period Weeks   Status New     PT LONG TERM GOAL #4   Title increase HS flexibility to 70 degrees SLR   Time 8   Period Weeks   Status New               Plan - 06/30/17 1334    Clinical Impression Statement Pt willing to perform today's supine interventions. No reports of increase pain. Pt continues to reports  good relief from traction.    Rehab Potential Good   PT Frequency 2x /  week   PT Duration 8 weeks   PT Treatment/Interventions ADLs/Self Care Home Management;Cryotherapy;Iontophoresis 4mg /ml Dexamethasone;Electrical Stimulation;Functional mobility training;Gait training;Ultrasound;Traction;Moist Heat;Therapeutic activities;Therapeutic exercise;Neuromuscular re-education;Balance training;Patient/family education;Manual techniques;Dry needling   PT Next Visit Plan may try to add core stability and go over proper body mechanics      Patient will benefit from skilled therapeutic intervention in order to improve the following deficits and impairments:  Abnormal gait, Decreased activity tolerance, Decreased balance, Decreased mobility, Decreased strength, Impaired flexibility, Pain, Increased muscle spasms, Decreased range of motion, Difficulty walking  Visit Diagnosis: Pain in right hip  Difficulty in walking, not elsewhere classified     Problem List Patient Active Problem List   Diagnosis Date Noted  . Chronic bilateral low back pain with bilateral sciatica 04/14/2017  . GE reflux 02/01/2012  . Fibrocystic breast disease 02/01/2012  . History of smoking 02/01/2012  . HTN (hypertension) 01/21/2011  . Hyperlipidemia 01/21/2011    Scot Jun, PTA 06/30/2017, 1:37 PM  [place] [address] [place], [place], [address] Phone: [phone]   Fax:  [phone]  Name: KADIATOU OPLINGER MRN: [id number] Date of Birth: April 06, 1956

## 2017-07-07 ENCOUNTER — Ambulatory Visit: Payer: 59 | Attending: Internal Medicine | Admitting: Physical Therapy

## 2017-07-07 ENCOUNTER — Encounter: Payer: Self-pay | Admitting: Physical Therapy

## 2017-07-07 DIAGNOSIS — R262 Difficulty in walking, not elsewhere classified: Secondary | ICD-10-CM | POA: Insufficient documentation

## 2017-07-07 DIAGNOSIS — M25551 Pain in right hip: Secondary | ICD-10-CM | POA: Diagnosis not present

## 2017-07-07 NOTE — Therapy (Signed)
[place] [address] [place], [place], [address] Phone: [phone]   Fax:  [phone]  Physical Therapy Treatment  Patient Details  Name: Kristy Black MRN: [id number] Date of Birth: [birth year] Referring Provider: Tedra Senegal   Encounter Date: 07/07/2017  PT End of Session - 07/07/17 1350    Visit Number  5    Date for PT Re-Evaluation  08/16/17    PT Start Time  1301    PT Stop Time  1357    PT Time Calculation (min)  56 min    Activity Tolerance  Patient tolerated treatment well    Behavior During Therapy  [facility] for tasks assessed/performed       Past Medical History:  Diagnosis Date  . 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 GERD (gastroesophageal reflux disease)   . Hyperlipidemia   . Hypertension     Past Surgical History:  Procedure Laterality Date  . BREAST SURGERY     l breast biopsy  . CARDIAC VALVE REPLACEMENT    . CESAREAN SECTION    . TUBAL LIGATION      There were no vitals filed for this visit.  Subjective Assessment - 07/07/17 1308    Subjective  "I hurt, I hurt for 2 days "    Currently in Pain?  Yes only when walking   only when walking   Pain Score  6     Pain Location  Buttocks    Pain Orientation  Right                      [facility] Adult PT Treatment/Exercise - 07/07/17 0001      Lumbar Exercises: Supine   Bridge  Compliant;2 seconds;15 reps    Straight Leg Raise  10 reps;2 seconds      Knee/Hip Exercises: Stretches   Passive Hamstring Stretch  Both;4 reps;10 seconds    Piriformis Stretch  Both;3 reps;20 seconds      Knee/Hip Exercises: Aerobic   Nustep  L3 x 6 min       Knee/Hip Exercises: Machines for Strengthening   Cybex Knee Flexion  20lb 2x10     Cybex Leg Press  20lb 2x10       Knee/Hip Exercises: Standing   Forward Step Up  2 sets;Hand Hold: 0;Step Height: 6";5 reps;Both      Traction   Type of Traction  Lumbar    Min (lbs)  50    Hold Time   static     Time  10               PT Short Term Goals - 06/26/17 1052      PT SHORT TERM GOAL #1   Title  independent with initial HEP    Status  Achieved        PT Long Term Goals - 06/16/17 1522      PT LONG TERM GOAL #1   Title  increase right hip strength to 4/5    Time  8    Period  Weeks    Status  New      PT LONG TERM GOAL #2   Title  decrease pain 50%    Time  8    Period  Weeks    Status  New      PT LONG TERM GOAL #3   Title  tolerate 10 minutes of walking    Time  8  Period  Weeks    Status  New      PT LONG TERM GOAL #4   Title  increase HS flexibility to 70 degrees SLR    Time  8    Period  Weeks    Status  New            Plan - 07/07/17 1350    Clinical Impression Statement  Pt willing to perform more activities. Pt does reports a pull in her L buttocks with seated knee flexion. Pt reported that she felt like her R  leg was going to sleep with passive HS stretch.    Rehab Potential  Good    PT Frequency  2x / week    PT Duration  8 weeks    PT Treatment/Interventions  ADLs/Self Care Home Management;Cryotherapy;Iontophoresis 4mg /ml Dexamethasone;Electrical Stimulation;Functional mobility training;Gait training;Ultrasound;Traction;Moist Heat;Therapeutic activities;Therapeutic exercise;Neuromuscular re-education;Balance training;Patient/family education;Manual techniques;Dry needling    PT Next Visit Plan  may try to add core stability and go over proper body mechanics       Patient will benefit from skilled therapeutic intervention in order to improve the following deficits and impairments:  Abnormal gait, Decreased activity tolerance, Decreased balance, Decreased mobility, Decreased strength, Impaired flexibility, Pain, Increased muscle spasms, Decreased range of motion, Difficulty walking  Visit Diagnosis: Pain in right hip  Difficulty in walking, not elsewhere classified     Problem List Patient Active Problem List    Diagnosis Date Noted  . Chronic bilateral low back pain with bilateral sciatica 04/14/2017  . GE reflux 02/01/2012  . Fibrocystic breast disease 02/01/2012  . History of smoking 02/01/2012  . HTN (hypertension) 01/21/2011  . Hyperlipidemia 01/21/2011    Scot Jun, PTA 07/07/2017, 1:54 PM  [place], [place], [address] Phone: [phone]   Fax:  [phone]  Name: CAIDENCE KASEMAN MRN: [id number] Date of Birth: 1956-02-03

## 2017-07-09 ENCOUNTER — Encounter: Payer: Self-pay | Admitting: Physical Therapy

## 2017-07-09 ENCOUNTER — Ambulatory Visit: Payer: 59 | Admitting: Physical Therapy

## 2017-07-09 DIAGNOSIS — R262 Difficulty in walking, not elsewhere classified: Secondary | ICD-10-CM

## 2017-07-09 DIAGNOSIS — M25551 Pain in right hip: Secondary | ICD-10-CM

## 2017-07-09 NOTE — Therapy (Signed)
[place], [place], [address] Phone: [phone]   Fax:  [phone]  Physical Therapy Treatment  Patient Details  Name: Kristy Black MRN: [id number] Date of Birth: Jul 03, 1956 Referring Provider: Tedra Senegal   Encounter Date: 07/09/2017  PT End of Session - 07/09/17 1507    Visit Number  6    Date for PT Re-Evaluation  08/16/17    PT Start Time  1430    PT Stop Time  1510    PT Time Calculation (min)  40 min    Activity Tolerance  Patient tolerated treatment well    Behavior During Therapy  [facility] for tasks assessed/performed       Past Medical History:  Diagnosis Date  . 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 GERD (gastroesophageal reflux disease)   . Hyperlipidemia   . Hypertension     Past Surgical History:  Procedure Laterality Date  . BREAST SURGERY     l breast biopsy  . CARDIAC VALVE REPLACEMENT    . CESAREAN SECTION    . TUBAL LIGATION      There were no vitals filed for this visit.  Subjective Assessment - 07/09/17 1430    Subjective  "Im sore all over"    Currently in Pain?  No/denies    Pain Score  0-No pain                      [facility] Adult PT Treatment/Exercise - 07/09/17 0001      Ambulation/Gait   Gait Comments  outdoor ambulation ~ 600 ft, 2 standing rest breaks due to increase pain in R glute "it is deep in there", Once pt stands and rest pain subsides and she is able to continues ambulating.      Lumbar Exercises: Aerobic   Stationary Bike  L1 x4 min       Lumbar Exercises: Machines for Strengthening   Other Lumbar Machine Exercise  rows and lats 25lb 2x10       Knee/Hip Exercises: Aerobic   Nustep  L3 x 6 min                PT Short Term Goals - 06/26/17 1052      PT SHORT TERM GOAL #1   Title  independent with initial HEP    Status  Achieved        PT Long Term Goals - 06/16/17 1522      PT LONG TERM GOAL #1   Title   increase right hip strength to 4/5    Time  8    Period  Weeks    Status  New      PT LONG TERM GOAL #2   Title  decrease pain 50%    Time  8    Period  Weeks    Status  New      PT LONG TERM GOAL #3   Title  tolerate 10 minutes of walking    Time  8    Period  Weeks    Status  New      PT LONG TERM GOAL #4   Title  increase HS flexibility to 70 degrees SLR    Time  8    Period  Weeks    Status  New            Plan - 07/09/17 1508    Clinical Impression Statement  All  postural exercises completed well, increase R glute pain with ambulation that subsided after a standing rest break.      Rehab Potential  Good    PT Frequency  2x / week    PT Duration  8 weeks    PT Treatment/Interventions  ADLs/Self Care Home Management;Cryotherapy;Iontophoresis 4mg /ml Dexamethasone;Electrical Stimulation;Functional mobility training;Gait training;Ultrasound;Traction;Moist Heat;Therapeutic activities;Therapeutic exercise;Neuromuscular re-education;Balance training;Patient/family education;Manual techniques;Dry needling    PT Next Visit Plan  may try to add core stability and go over proper body mechanics, aerobic activities       Patient will benefit from skilled therapeutic intervention in order to improve the following deficits and impairments:  Abnormal gait, Decreased activity tolerance, Decreased balance, Decreased mobility, Decreased strength, Impaired flexibility, Pain, Increased muscle spasms, Decreased range of motion, Difficulty walking  Visit Diagnosis: Difficulty in walking, not elsewhere classified  Pain in right hip     Problem List Patient Active Problem List   Diagnosis Date Noted  . Chronic bilateral low back pain with bilateral sciatica 04/14/2017  . GE reflux 02/01/2012  . Fibrocystic breast disease 02/01/2012  . History of smoking 02/01/2012  . HTN (hypertension) 01/21/2011  . Hyperlipidemia 01/21/2011    Scot Jun, PTA 07/09/2017, 3:12  PM  [place] [address] [place], [place], [address] Phone: [phone]   Fax:  [phone]  Name: Kristy Black MRN: [id number] Date of Birth: September 30, 1955

## 2017-07-14 ENCOUNTER — Encounter: Payer: Self-pay | Admitting: Physical Therapy

## 2017-07-14 ENCOUNTER — Ambulatory Visit: Payer: 59 | Admitting: Physical Therapy

## 2017-07-14 DIAGNOSIS — M25551 Pain in right hip: Secondary | ICD-10-CM | POA: Diagnosis not present

## 2017-07-14 DIAGNOSIS — R262 Difficulty in walking, not elsewhere classified: Secondary | ICD-10-CM

## 2017-07-14 NOTE — Therapy (Signed)
[place], [place], [address] Phone: [phone]   Fax:  [phone]  Physical Therapy Treatment  Patient Details  Name: SHAOLIN ARMAS MRN: [id number] Date of Birth: 11-Jul-1956 Referring Provider: Tedra Senegal   Encounter Date: 07/14/2017  PT End of Session - 07/14/17 1343    Visit Number  7    Date for PT Re-Evaluation  08/16/17    PT Start Time  1300    PT Stop Time  1345    PT Time Calculation (min)  45 min    Activity Tolerance  Patient tolerated treatment well    Behavior During Therapy  [facility] for tasks assessed/performed       Past Medical History:  Diagnosis Date  . 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 GERD (gastroesophageal reflux disease)   . Hyperlipidemia   . Hypertension     Past Surgical History:  Procedure Laterality Date  . BREAST SURGERY     l breast biopsy  . CARDIAC VALVE REPLACEMENT    . CESAREAN SECTION    . TUBAL LIGATION      There were no vitals filed for this visit.  Subjective Assessment - 07/14/17 1301    Subjective  Pt reports that she is not feeling well today's like she is catching a cold. Pt reports that she is feeling a lot better walking and sleeping    Currently in Pain?  No/denies    Pain Score  0-No pain                      [facility] Adult PT Treatment/Exercise - 07/14/17 0001      Lumbar Exercises: Machines for Strengthening   Other Lumbar Machine Exercise  rows and lats 25lb 2x10       Knee/Hip Exercises: Stretches   Passive Hamstring Stretch  Both;4 reps;10 seconds    Piriformis Stretch  Both;3 reps;20 seconds      Knee/Hip Exercises: Aerobic   Nustep  L4 x 6 min       Knee/Hip Exercises: Machines for Strengthening   Cybex Knee Extension  5lb 2x10     Cybex Knee Flexion  20lb 2x10     Cybex Leg Press  20lb 2x10     Total Gym Leg Press         Knee/Hip Exercises: Standing   Walking with Sports Cord  30lb 4 way x 3 each                 PT Short Term Goals - 06/26/17 1052      PT SHORT TERM GOAL #1   Title  independent with initial HEP    Status  Achieved        PT Long Term Goals - 06/16/17 1522      PT LONG TERM GOAL #1   Title  increase right hip strength to 4/5    Time  8    Period  Weeks    Status  New      PT LONG TERM GOAL #2   Title  decrease pain 50%    Time  8    Period  Weeks    Status  New      PT LONG TERM GOAL #3   Title  tolerate 10 minutes of walking    Time  8    Period  Weeks    Status  New  PT LONG TERM GOAL #4   Title  increase HS flexibility to 70 degrees SLR    Time  8    Period  Weeks    Status  New            Plan - 07/14/17 1345    Clinical Impression Statement  Pt reports some improvement walking and sleeping. Pt reports that she was able to sleep without her leg going to sleep waking her up. Pt able to complete all of today's exercises she does report R hip pain with resisted side steps.     Rehab Potential  Good    PT Frequency  2x / week    PT Duration  8 weeks    PT Treatment/Interventions  ADLs/Self Care Home Management;Cryotherapy;Iontophoresis 4mg /ml Dexamethasone;Electrical Stimulation;Functional mobility training;Gait training;Ultrasound;Traction;Moist Heat;Therapeutic activities;Therapeutic exercise;Neuromuscular re-education;Balance training;Patient/family education;Manual techniques;Dry needling    PT Next Visit Plan  may try to add core stability and go over proper body mechanics, aerobic activities       Patient will benefit from skilled therapeutic intervention in order to improve the following deficits and impairments:  Abnormal gait, Decreased activity tolerance, Decreased balance, Decreased mobility, Decreased strength, Impaired flexibility, Pain, Increased muscle spasms, Decreased range of motion, Difficulty walking  Visit Diagnosis: Difficulty in walking, not elsewhere classified  Pain in right hip     Problem  List Patient Active Problem List   Diagnosis Date Noted  . Chronic bilateral low back pain with bilateral sciatica 04/14/2017  . GE reflux 02/01/2012  . Fibrocystic breast disease 02/01/2012  . History of smoking 02/01/2012  . HTN (hypertension) 01/21/2011  . Hyperlipidemia 01/21/2011    Scot Jun, PTA 07/14/2017, 1:47 PM  [place] [address] [place] [place], [place], [address] Phone: [phone]   Fax:  [phone]  Name: MALKA BOCEK MRN: [id number] Date of Birth: 28-Oct-1955

## 2017-07-16 ENCOUNTER — Encounter: Payer: Self-pay | Admitting: Physical Therapy

## 2017-07-16 ENCOUNTER — Ambulatory Visit: Payer: 59 | Admitting: Physical Therapy

## 2017-07-16 DIAGNOSIS — R262 Difficulty in walking, not elsewhere classified: Secondary | ICD-10-CM

## 2017-07-16 DIAGNOSIS — M25551 Pain in right hip: Secondary | ICD-10-CM | POA: Diagnosis not present

## 2017-07-16 NOTE — Therapy (Signed)
[place], [place], [address] Phone: [phone]   Fax:  [phone]  Physical Therapy Treatment  Patient Details  Name: Kristy Black MRN: [id number] Date of Birth: January 31, 1956 Referring Provider: Tedra Senegal   Encounter Date: 07/16/2017  PT End of Session - 07/16/17 1509    Visit Number  8    Date for PT Re-Evaluation  08/16/17    PT Start Time  1430    PT Stop Time  1515    PT Time Calculation (min)  45 min    Activity Tolerance  Patient tolerated treatment well    Behavior During Therapy  [facility] for tasks assessed/performed       Past Medical History:  Diagnosis Date  . 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 GERD (gastroesophageal reflux disease)   . Hyperlipidemia   . Hypertension     Past Surgical History:  Procedure Laterality Date  . BREAST SURGERY     l breast biopsy  . CARDIAC VALVE REPLACEMENT    . CESAREAN SECTION    . TUBAL LIGATION      There were no vitals filed for this visit.  Subjective Assessment - 07/16/17 1433    Subjective  "Good" Pt reports that she thinks the exercises are helping    Currently in Pain?  No/denies    Pain Score  0-No pain                      [facility] Adult PT Treatment/Exercise - 07/16/17 0001      Lumbar Exercises: Aerobic   Stationary Bike  L0 x5 min       Knee/Hip Exercises: Aerobic   Nustep  L4 x 6 min       Knee/Hip Exercises: Machines for Strengthening   Cybex Knee Extension  5lb 2x10     Cybex Knee Flexion  20lb x15 x10     Cybex Leg Press  20lb 2x10       Knee/Hip Exercises: Standing   Walking with Sports Cord  30lb 4 way x 5 each                PT Short Term Goals - 06/26/17 1052      PT SHORT TERM GOAL #1   Title  independent with initial HEP    Status  Achieved        PT Long Term Goals - 06/16/17 1522      PT LONG TERM GOAL #1   Title  increase right hip strength to 4/5    Time  8    Period  Weeks    Status  New      PT LONG TERM GOAL #2   Title  decrease pain 50%    Time  8    Period  Weeks    Status  New      PT LONG TERM GOAL #3   Title  tolerate 10 minutes of walking    Time  8    Period  Weeks    Status  New      PT LONG TERM GOAL #4   Title  increase HS flexibility to 70 degrees SLR    Time  8    Period  Weeks    Status  New            Plan - 07/16/17 1509    Clinical Impression Statement  Pt able to  complete all of today exercises, she does reports R hip pain with resisted backwards walking and side stepping to R eccentrically. She reports some RLE nu,numbness with leg press.     Rehab Potential  Good    PT Treatment/Interventions  ADLs/Self Care Home Management;Cryotherapy;Iontophoresis 4mg /ml Dexamethasone;Electrical Stimulation;Functional mobility training;Gait training;Ultrasound;Traction;Moist Heat;Therapeutic activities;Therapeutic exercise;Neuromuscular re-education;Balance training;Patient/family education;Manual techniques;Dry needling    PT Next Visit Plan  may try to add core stability and go over proper body mechanics, aerobic activities       Patient will benefit from skilled therapeutic intervention in order to improve the following deficits and impairments:  Abnormal gait, Decreased activity tolerance, Decreased balance, Decreased mobility, Decreased strength, Impaired flexibility, Pain, Increased muscle spasms, Decreased range of motion, Difficulty walking  Visit Diagnosis: Difficulty in walking, not elsewhere classified  Pain in right hip     Problem List Patient Active Problem List   Diagnosis Date Noted  . Chronic bilateral low back pain with bilateral sciatica 04/14/2017  . GE reflux 02/01/2012  . Fibrocystic breast disease 02/01/2012  . History of smoking 02/01/2012  . HTN (hypertension) 01/21/2011  . Hyperlipidemia 01/21/2011    Scot Jun, PTA 07/16/2017, 3:12 PM  [address] [place], [place], [address] Phone: [phone]   Fax:  [phone]  Name: Kristy Black MRN: [id number] Date of Birth: 08/08/1956

## 2017-07-20 ENCOUNTER — Other Ambulatory Visit: Payer: 59 | Admitting: Internal Medicine

## 2017-07-21 ENCOUNTER — Encounter: Payer: Self-pay | Admitting: Physical Therapy

## 2017-07-21 ENCOUNTER — Ambulatory Visit: Payer: 59 | Admitting: Physical Therapy

## 2017-07-21 DIAGNOSIS — R262 Difficulty in walking, not elsewhere classified: Secondary | ICD-10-CM

## 2017-07-21 DIAGNOSIS — M25551 Pain in right hip: Secondary | ICD-10-CM | POA: Diagnosis not present

## 2017-07-21 NOTE — Therapy (Signed)
[place], [place], [address] Phone: [phone]   Fax:  [phone]  Physical Therapy Treatment  Patient Details  Name: Kristy Black MRN: [id number] Date of Birth: 1955-12-15 Referring Provider: Tedra Senegal   Encounter Date: 07/21/2017  PT End of Session - 07/21/17 1423    Visit Number  9    Date for PT Re-Evaluation  08/16/17    PT Start Time  1345    PT Stop Time  1425    PT Time Calculation (min)  40 min    Activity Tolerance  Patient tolerated treatment well    Behavior During Therapy  [facility] for tasks assessed/performed       Past Medical History:  Diagnosis Date  . Elevated homocysteine ([place])   . GERD (gastroesophageal reflux disease)   . Hyperlipidemia   . Hypertension     Past Surgical History:  Procedure Laterality Date  . BREAST SURGERY     l breast biopsy  . CARDIAC VALVE REPLACEMENT    . CESAREAN SECTION    . TUBAL LIGATION      There were no vitals filed for this visit.  Subjective Assessment - 07/21/17 1348    Subjective  Pt reports that things are the same    Currently in Pain?  No/denies    Pain Score  0-No pain                      [facility] Adult PT Treatment/Exercise - 07/21/17 0001      Ambulation/Gait   Gait Comments  ambulated down hall one seeted reat break required due yo R hip pain      Lumbar Exercises: Aerobic   Stationary Bike  L0 X 4 min     Elliptical  I8 R 4 28frd/1rev multiple rest breaks needed      Lumbar Exercises: Machines for Strengthening   Other Lumbar Machine Exercise  rows and lats 25lb 2x10       Knee/Hip Exercises: Machines for Strengthening   Cybex Knee Extension  10lb 2x10     Cybex Knee Flexion  25lb x10     Cybex Leg Press  30lb 2x10       Knee/Hip Exercises: Standing   Hip Abduction  Both;1 set;10 reps;Knee straight    Abduction Limitations  5    Hip Extension  1 set;10 reps;Knee straight;Both;Right    Extension Limitations  5               PT Short Term Goals - 06/26/17 1052      PT SHORT TERM GOAL #1   Title  independent with initial HEP    Status  Achieved        PT Long Term Goals - 06/16/17 1522      PT LONG TERM GOAL #1   Title  increase right hip strength to 4/5    Time  8    Period  Weeks    Status  New      PT LONG TERM GOAL #2   Title  decrease pain 50%    Time  8    Period  Weeks    Status  New      PT LONG TERM GOAL #3   Title  tolerate 10 minutes of walking    Time  8    Period  Weeks    Status  New  PT LONG TERM GOAL #4   Title  increase HS flexibility to 70 degrees SLR    Time  8    Period  Weeks    Status  New            Plan - 07/21/17 1425    Clinical Impression Statement  Pt with decrease LE endurance. Pt reports R hip pain with wt bearing activities. Pt's pain goes away after a minute or two of rest. no numbness reported on leg press with increase weight.      Rehab Potential  Good    PT Frequency  2x / week    PT Duration  8 weeks    PT Treatment/Interventions  ADLs/Self Care Home Management;Cryotherapy;Iontophoresis 4mg /ml Dexamethasone;Electrical Stimulation;Functional mobility training;Gait training;Ultrasound;Traction;Moist Heat;Therapeutic activities;Therapeutic exercise;Neuromuscular re-education;Balance training;Patient/family education;Manual techniques;Dry needling    PT Next Visit Plan  muscular endurance for LE's       Patient will benefit from skilled therapeutic intervention in order to improve the following deficits and impairments:  Abnormal gait, Decreased activity tolerance, Decreased balance, Decreased mobility, Decreased strength, Impaired flexibility, Pain, Increased muscle spasms, Decreased range of motion, Difficulty walking  Visit Diagnosis: Difficulty in walking, not elsewhere classified  Pain in right hip     Problem List Patient Active Problem List   Diagnosis Date Noted  . Chronic  bilateral low back pain with bilateral sciatica 04/14/2017  . GE reflux 02/01/2012  . Fibrocystic breast disease 02/01/2012  . History of smoking 02/01/2012  . HTN (hypertension) 01/21/2011  . Hyperlipidemia 01/21/2011    Scot Jun, PTA 07/21/2017, 2:29 PM  [place], [place], [address] Phone: [phone]   Fax:  [phone]  Name: Kristy Black MRN: [id number] Date of Birth: [birth year]

## 2017-07-29 ENCOUNTER — Encounter: Payer: Self-pay | Admitting: Physical Therapy

## 2017-07-29 ENCOUNTER — Ambulatory Visit: Payer: 59 | Admitting: Physical Therapy

## 2017-07-29 DIAGNOSIS — M25551 Pain in right hip: Secondary | ICD-10-CM | POA: Diagnosis not present

## 2017-07-29 DIAGNOSIS — R262 Difficulty in walking, not elsewhere classified: Secondary | ICD-10-CM

## 2017-07-29 NOTE — Therapy (Signed)
[place], [place], [address] Phone: [phone]   Fax:  [phone]  Physical Therapy Treatment  Patient Details  Name: Kristy Black MRN: [id number] Date of Birth: 11-Mar-1956 Referring Provider: Tedra Senegal   Encounter Date: 07/29/2017  PT End of Session - 07/29/17 1343    Visit Number  10    Date for PT Re-Evaluation  08/16/17    PT Start Time  1300    PT Stop Time  1345    PT Time Calculation (min)  45 min    Activity Tolerance  Patient tolerated treatment well    Behavior During Therapy  [facility] for tasks assessed/performed       Past Medical History:  Diagnosis Date  . 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 GERD (gastroesophageal reflux disease)   . Hyperlipidemia   . Hypertension     Past Surgical History:  Procedure Laterality Date  . BREAST SURGERY     l breast biopsy  . CARDIAC VALVE REPLACEMENT    . CESAREAN SECTION    . TUBAL LIGATION      There were no vitals filed for this visit.  Subjective Assessment - 07/29/17 1303    Subjective  "Same"    Currently in Pain?  No/denies    Pain Score  0-No pain                      [facility] Adult PT Treatment/Exercise - 07/29/17 0001      Ambulation/Gait   Gait Comments  Gait around room with some R hip pain ~180 ft      Lumbar Exercises: Aerobic   UBE (Upper Arm Bike)  NuStep L4 x 6       Knee/Hip Exercises: Machines for Strengthening   Cybex Knee Extension  10lb 2x15    Cybex Knee Flexion  25lb x15    Cybex Leg Press  20lb 2x15       Knee/Hip Exercises: Standing   Hip Abduction  Both;1 set;Knee straight;15 reps    Abduction Limitations  5    Hip Extension  1 set;Knee straight;Both;Right;15 reps    Extension Limitations  5    Walking with Sports Cord  30lb side step x5 to R 2x5 L    Other Standing Knee Exercises  RLE SLS x2 min       Manual Therapy   Manual Therapy  Passive ROM    Manual therapy comments   Some HS and piriformis     Passive ROM  R hip all directions,                PT Short Term Goals - 06/26/17 1052      PT SHORT TERM GOAL #1   Title  independent with initial HEP    Status  Achieved        PT Long Term Goals - 07/29/17 1351      PT LONG TERM GOAL #2   Title  decrease pain 50%    Status  On-going      PT LONG TERM GOAL #3   Title  tolerate 10 minutes of walking    Status  On-going      PT LONG TERM GOAL #4   Title  increase HS flexibility to 70 degrees SLR    Status  Achieved            Plan - 07/29/17 1348  Clinical Impression Statement  Pt R hip remain with wt bearing activities, resisted side step to Pt L side provokes main the most. Pt also reports R hip pain after about 2 minutes of ambulation. Pt reports only a little hip soreness with SLS on RLE.    PT Treatment/Interventions  ADLs/Self Care Home Management;Cryotherapy;Iontophoresis 4mg /ml Dexamethasone;Electrical Stimulation;Functional mobility training;Gait training;Ultrasound;Traction;Moist Heat;Therapeutic activities;Therapeutic exercise;Neuromuscular re-education;Balance training;Patient/family education;Manual techniques;Dry needling    PT Next Visit Plan  muscular endurance for LE's       Patient will benefit from skilled therapeutic intervention in order to improve the following deficits and impairments:  Abnormal gait, Decreased activity tolerance, Decreased balance, Decreased mobility, Decreased strength, Impaired flexibility, Pain, Increased muscle spasms, Decreased range of motion, Difficulty walking  Visit Diagnosis: Pain in right hip  Difficulty in walking, not elsewhere classified     Problem List Patient Active Problem List   Diagnosis Date Noted  . Chronic bilateral low back pain with bilateral sciatica 04/14/2017  . GE reflux 02/01/2012  . Fibrocystic breast disease 02/01/2012  . History of smoking 02/01/2012  . HTN (hypertension) 01/21/2011  .  Hyperlipidemia 01/21/2011    Scot Jun, PTA 07/29/2017, 1:52 PM  [place] [address] [place], [place], [address] Phone: [phone]   Fax:  [phone]  Name: Kristy Black MRN: [id number] Date of Birth: Jan 28, 1956

## 2017-07-30 ENCOUNTER — Ambulatory Visit: Payer: 59 | Admitting: Physical Therapy

## 2017-07-30 ENCOUNTER — Encounter: Payer: Self-pay | Admitting: Physical Therapy

## 2017-07-30 ENCOUNTER — Ambulatory Visit: Payer: 59 | Admitting: Internal Medicine

## 2017-07-30 DIAGNOSIS — M25551 Pain in right hip: Secondary | ICD-10-CM | POA: Diagnosis not present

## 2017-07-30 DIAGNOSIS — R262 Difficulty in walking, not elsewhere classified: Secondary | ICD-10-CM

## 2017-07-30 NOTE — Therapy (Signed)
[address] [place], [place], [address] Phone: [phone]   Fax:  [phone]  Physical Therapy Treatment  Patient Details  Name: Kristy Black MRN: [id number] Date of Birth: 1956/08/04 Referring Provider: Tedra Senegal   Encounter Date: 07/30/2017  PT End of Session - 07/30/17 1420    Visit Number  11    Date for PT Re-Evaluation  08/16/17    PT Start Time  2992    PT Stop Time  1425    PT Time Calculation (min)  40 min       Past Medical History:  Diagnosis Date  . Elevated homocysteine ([place])   . GERD (gastroesophageal reflux disease)   . Hyperlipidemia   . Hypertension     Past Surgical History:  Procedure Laterality Date  . BREAST SURGERY     l breast biopsy  . CARDIAC VALVE REPLACEMENT    . CESAREAN SECTION    . TUBAL LIGATION      There were no vitals filed for this visit.  Subjective Assessment - 07/30/17 1348    Subjective  "im sore"    Currently in Pain?  No/denies    Pain Score  0-No pain                      [facility] Adult PT Treatment/Exercise - 07/30/17 0001      Lumbar Exercises: Aerobic   UBE (Upper Arm Bike)  NuStep L4 x 4 LE only, X3 with both UE & LE       Lumbar Exercises: Machines for Strengthening   Other Lumbar Machine Exercise  rows and lats 25lb 2x10       Knee/Hip Exercises: Aerobic   Tread Mill  1 mph x 5 min      Knee/Hip Exercises: Machines for Strengthening   Cybex Leg Press  30lb 2x10       Knee/Hip Exercises: Standing   Hip Abduction  Both;Knee straight;15 reps;2 sets    Abduction Limitations  5    Hip Extension  Knee straight;Both;Right;15 reps;2 sets    Extension Limitations  5               PT Short Term Goals - 06/26/17 1052      PT SHORT TERM GOAL #1   Title  independent with initial HEP    Status  Achieved        PT Long Term Goals - 07/29/17 1351      PT LONG TERM GOAL #2   Title  decrease pain 50%    Status  On-going      PT LONG TERM GOAL #3   Title  tolerate 10 minutes of walking    Status  On-going      PT LONG TERM GOAL #4   Title  increase HS flexibility to 70 degrees SLR    Status  Achieved            Plan - 07/30/17 1421    Clinical Impression Statement  Pt does really well with all exercises that requires her to be non weight bearing. increase R hip pain  on tread mill. Pt able to ambulate ~2 minutes before having R hip pain then she needed to lean on treadmill to continue.     Rehab Potential  Good    PT Frequency  2x / week    PT Duration  8 weeks    PT Treatment/Interventions  ADLs/Self Care Home Management;Cryotherapy;Iontophoresis 4mg /ml Dexamethasone;Electrical Stimulation;Functional mobility training;Gait training;Ultrasound;Traction;Moist Heat;Therapeutic activities;Therapeutic exercise;Neuromuscular re-education;Balance training;Patient/family education;Manual techniques;Dry needling    PT Next Visit Plan  muscular endurance for LE's       Patient will benefit from skilled therapeutic intervention in order to improve the following deficits and impairments:  Abnormal gait, Decreased activity tolerance, Decreased balance, Decreased mobility, Decreased strength, Impaired flexibility, Pain, Increased muscle spasms, Decreased range of motion, Difficulty walking  Visit Diagnosis: Difficulty in walking, not elsewhere classified  Pain in right hip     Problem List Patient Active Problem List   Diagnosis Date Noted  . Chronic bilateral low back pain with bilateral sciatica 04/14/2017  . GE reflux 02/01/2012  . Fibrocystic breast disease 02/01/2012  . History of smoking 02/01/2012  . HTN (hypertension) 01/21/2011  . Hyperlipidemia 01/21/2011    Scot Jun, PTA 07/30/2017, 2:29 PM  [place], [place], [address] Phone: [phone]   Fax:  [phone]  Name:  Kristy Black MRN: [id number] Date of Birth: November 30, 1955

## 2017-08-03 ENCOUNTER — Ambulatory Visit: Payer: 59 | Attending: Internal Medicine | Admitting: Physical Therapy

## 2017-08-03 ENCOUNTER — Encounter: Payer: Self-pay | Admitting: Physical Therapy

## 2017-08-03 DIAGNOSIS — M25551 Pain in right hip: Secondary | ICD-10-CM | POA: Diagnosis not present

## 2017-08-03 DIAGNOSIS — R262 Difficulty in walking, not elsewhere classified: Secondary | ICD-10-CM | POA: Insufficient documentation

## 2017-08-03 NOTE — Therapy (Signed)
[place], [place], [address] Phone: [phone]   Fax:  [phone]  Physical Therapy Treatment  Patient Details  Name: Kristy Black MRN: [id number] Date of Birth: 08-17-1956 Referring Provider: Tedra Senegal   Encounter Date: 08/03/2017  PT End of Session - 08/03/17 1223    Visit Number  12    Date for PT Re-Evaluation  08/16/17    PT Start Time  1143    PT Stop Time  1226    PT Time Calculation (min)  43 min    Activity Tolerance  Patient tolerated treatment well    Behavior During Therapy  [facility] for tasks assessed/performed       Past Medical History:  Diagnosis Date  . Elevated homocysteine ([place])   . GERD (gastroesophageal reflux disease)   . Hyperlipidemia   . Hypertension     Past Surgical History:  Procedure Laterality Date  . BREAST SURGERY     l breast biopsy  . CARDIAC VALVE REPLACEMENT    . CESAREAN SECTION    . TUBAL LIGATION      There were no vitals filed for this visit.  Subjective Assessment - 08/03/17 1145    Subjective  "My R leg today feel like it is half a sleep"    Currently in Pain?  No/denies    Pain Score  0-No pain                      [facility] Adult PT Treatment/Exercise - 08/03/17 0001      Lumbar Exercises: Aerobic   Elliptical  I5 R5 74frd/2rev    UBE (Upper Arm Bike)  NuStep L4 x 4 LE only, X3 with both UE & LE       Lumbar Exercises: Seated   Hip Flexion on Ball  20 reps;Both holding yello wball       Knee/Hip Exercises: Standing   Hip Flexion  Left;2 sets;15 reps;Knee bent;10 reps 2lb    Lateral Step Up  Right;1 set;10 reps;Hand Hold: 0;Step Height: 6"    Forward Step Up  Right;1 set;10 reps    Walking with Sports Cord  30lb side step x5 to R 2x5 L               PT Short Term Goals - 06/26/17 1052      PT SHORT TERM GOAL #1   Title  independent with initial HEP    Status  Achieved        PT Long Term Goals -  07/29/17 1351      PT LONG TERM GOAL #2   Title  decrease pain 50%    Status  On-going      PT LONG TERM GOAL #3   Title  tolerate 10 minutes of walking    Status  On-going      PT LONG TERM GOAL #4   Title  increase HS flexibility to 70 degrees SLR    Status  Achieved            Plan - 08/03/17 1223    Clinical Impression Statement  R hip pain reported after ! 90 seconds on elliptical. Quads fatigue really quick with activity. She reports some R hip pain with resisted side step but was less. no pain with step up only muscle burning.    Rehab Potential  Good    PT Frequency  2x / week  PT Duration  8 weeks    PT Treatment/Interventions  ADLs/Self Care Home Management;Cryotherapy;Iontophoresis 4mg /ml Dexamethasone;Electrical Stimulation;Functional mobility training;Gait training;Ultrasound;Traction;Moist Heat;Therapeutic activities;Therapeutic exercise;Neuromuscular re-education;Balance training;Patient/family education;Manual techniques;Dry needling    PT Next Visit Plan  muscular endurance for LE's       Patient will benefit from skilled therapeutic intervention in order to improve the following deficits and impairments:  Abnormal gait, Decreased activity tolerance, Decreased balance, Decreased mobility, Decreased strength, Impaired flexibility, Pain, Increased muscle spasms, Decreased range of motion, Difficulty walking  Visit Diagnosis: Pain in right hip  Difficulty in walking, not elsewhere classified     Problem List Patient Active Problem List   Diagnosis Date Noted  . Chronic bilateral low back pain with bilateral sciatica 04/14/2017  . GE reflux 02/01/2012  . Fibrocystic breast disease 02/01/2012  . History of smoking 02/01/2012  . HTN (hypertension) 01/21/2011  . Hyperlipidemia 01/21/2011    Scot Jun, PTA 08/03/2017, 12:26 PM  [place] [address] [place], [place],  [address] Phone: [phone]   Fax:  [phone]  Name: Kristy Black MRN: [id number] Date of Birth: November 10, 1955

## 2017-08-05 ENCOUNTER — Encounter: Payer: Self-pay | Admitting: Physical Therapy

## 2017-08-05 ENCOUNTER — Ambulatory Visit: Payer: 59 | Admitting: Physical Therapy

## 2017-08-05 DIAGNOSIS — M25551 Pain in right hip: Secondary | ICD-10-CM | POA: Diagnosis not present

## 2017-08-05 DIAGNOSIS — R262 Difficulty in walking, not elsewhere classified: Secondary | ICD-10-CM

## 2017-08-05 NOTE — Therapy (Signed)
[place], [place], [address] Phone: [phone]   Fax:  [phone]  Physical Therapy Treatment  Patient Details  Name: Kristy Black MRN: [id number] Date of Birth: 01/27/1956 Referring Provider: Tedra Senegal   Encounter Date: 08/05/2017  PT End of Session - 08/05/17 1226    Visit Number  13    Date for PT Re-Evaluation  08/16/17    PT Start Time  1145    PT Stop Time  1226    PT Time Calculation (min)  41 min    Activity Tolerance  Patient tolerated treatment well    Behavior During Therapy  [facility] for tasks assessed/performed       Past Medical History:  Diagnosis Date  . 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 GERD (gastroesophageal reflux disease)   . Hyperlipidemia   . Hypertension     Past Surgical History:  Procedure Laterality Date  . BREAST SURGERY     l breast biopsy  . CARDIAC VALVE REPLACEMENT    . CESAREAN SECTION    . TUBAL LIGATION      There were no vitals filed for this visit.  Subjective Assessment - 08/05/17 1150    Subjective  I started the prednisone Monday when you left here""I feel good, I slept last night without my leg waking me up numb"    Currently in Pain?  No/denies    Pain Score  0-No pain                      [facility] Adult PT Treatment/Exercise - 08/05/17 0001      Ambulation/Gait   Gait Comments  gair outside around front island, one standing rest break required due to hip pain       Lumbar Exercises: Aerobic   Elliptical  I5 R5 19frd/2rev    UBE (Upper Arm Bike)  NuStep L2 x 4 LE only, X3 with both UE & LE       Lumbar Exercises: Machines for Strengthening   Other Lumbar Machine Exercise  rows and lats 25lb 2x10       Knee/Hip Exercises: Machines for Strengthening   Cybex Leg Press  30lb 2x15      Knee/Hip Exercises: Standing   Walking with Sports Cord  20lb side steps x5 each               PT Short Term Goals - 06/26/17  1052      PT SHORT TERM GOAL #1   Title  independent with initial HEP    Status  Achieved        PT Long Term Goals - 07/29/17 1351      PT LONG TERM GOAL #2   Title  decrease pain 50%    Status  On-going      PT LONG TERM GOAL #3   Title  tolerate 10 minutes of walking    Status  On-going      PT LONG TERM GOAL #4   Title  increase HS flexibility to 70 degrees SLR    Status  Achieved            Plan - 08/05/17 1226    Clinical Impression Statement  Pt able to tolerated more activity before having R hip pain. Pt LE fatigues quick requiring her to stop due to burning in her thigh. Pt able to complete outdoor ambulation with one standing rest break with  R hip pain.     Rehab Potential  Good    PT Frequency  2x / week    PT Duration  8 weeks    PT Treatment/Interventions  ADLs/Self Care Home Management;Cryotherapy;Iontophoresis 4mg /ml Dexamethasone;Electrical Stimulation;Functional mobility training;Gait training;Ultrasound;Traction;Moist Heat;Therapeutic activities;Therapeutic exercise;Neuromuscular re-education;Balance training;Patient/family education;Manual techniques;Dry needling    PT Next Visit Plan  muscular endurance for LE's       Patient will benefit from skilled therapeutic intervention in order to improve the following deficits and impairments:  Abnormal gait, Decreased activity tolerance, Decreased balance, Decreased mobility, Decreased strength, Impaired flexibility, Pain, Increased muscle spasms, Decreased range of motion, Difficulty walking  Visit Diagnosis: Difficulty in walking, not elsewhere classified  Pain in right hip     Problem List Patient Active Problem List   Diagnosis Date Noted  . Chronic bilateral low back pain with bilateral sciatica 04/14/2017  . GE reflux 02/01/2012  . Fibrocystic breast disease 02/01/2012  . History of smoking 02/01/2012  . HTN (hypertension) 01/21/2011  . Hyperlipidemia 01/21/2011    Scot Jun,  PTA 08/05/2017, 12:28 PM  [place] [address] [address] [place], [place], [address] Phone: [phone]   Fax:  [phone]  Name: Kristy Black MRN: [id number] Date of Birth: 1955/09/05

## 2017-08-10 ENCOUNTER — Ambulatory Visit: Payer: 59 | Admitting: Physical Therapy

## 2017-08-13 ENCOUNTER — Encounter: Payer: Self-pay | Admitting: Physical Therapy

## 2017-08-13 ENCOUNTER — Ambulatory Visit: Payer: 59 | Admitting: Physical Therapy

## 2017-08-13 DIAGNOSIS — R262 Difficulty in walking, not elsewhere classified: Secondary | ICD-10-CM

## 2017-08-13 DIAGNOSIS — M25551 Pain in right hip: Secondary | ICD-10-CM | POA: Diagnosis not present

## 2017-08-13 NOTE — Therapy (Signed)
[place], [place], [address] Phone: [phone]   Fax:  [phone]  Physical Therapy Treatment  Patient Details  Name: Kristy Black MRN: [id number] Date of Birth: 1956/08/29 Referring Provider: Tedra Senegal   Encounter Date: 08/13/2017  PT End of Session - 08/13/17 1217    Visit Number  14    Date for PT Re-Evaluation  08/16/17    PT Start Time  1146    PT Stop Time  1221    PT Time Calculation (min)  35 min       Past Medical History:  Diagnosis Date  . 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 GERD (gastroesophageal reflux disease)   . Hyperlipidemia   . Hypertension     Past Surgical History:  Procedure Laterality Date  . BREAST SURGERY     l breast biopsy  . CARDIAC VALVE REPLACEMENT    . CESAREAN SECTION    . TUBAL LIGATION      There were no vitals filed for this visit.  Subjective Assessment - 08/13/17 1149    Subjective  "Can we just do traction, I don't fel like doing anything today"    Currently in Pain?  No/denies    Pain Score  0-No pain                      [facility] Adult PT Treatment/Exercise - 08/13/17 0001      Lumbar Exercises: Aerobic   Stationary Bike  L0 x5 min     UBE (Upper Arm Bike)  L3 x 3 min       Modalities   Modalities  Traction      Traction   Type of Traction  Lumbar    Max (lbs)  65    Hold Time  10    Time  10               PT Short Term Goals - 06/26/17 1052      PT SHORT TERM GOAL #1   Title  independent with initial HEP    Status  Achieved        PT Long Term Goals - 07/29/17 1351      PT LONG TERM GOAL #2   Title  decrease pain 50%    Status  On-going      PT LONG TERM GOAL #3   Title  tolerate 10 minutes of walking    Status  On-going      PT LONG TERM GOAL #4   Title  increase HS flexibility to 70 degrees SLR    Status  Achieved            Plan - 08/13/17 1215    Clinical Impression  Statement  Pt reports that she was not feeling well today and preferred not to do any exercises. Pt reports that it has to be I circulation issue, and would like to continue her care at home.    Rehab Potential  Good    PT Frequency  2x / week    PT Duration  8 weeks    PT Next Visit Plan  D/C PT. pt stated that she returns to [place] January 8th    [place], sit to stand no support,hip extensions, rows, and extensions.        Patient will benefit from skilled therapeutic intervention in order to improve the following deficits  and impairments:  Abnormal gait, Decreased activity tolerance, Decreased balance, Decreased mobility, Decreased strength, Impaired flexibility, Pain, Increased muscle spasms, Decreased range of motion, Difficulty walking  Visit Diagnosis: Pain in right hip  Difficulty in walking, not elsewhere classified     Problem List Patient Active Problem List   Diagnosis Date Noted  . Chronic bilateral low back pain with bilateral sciatica 04/14/2017  . GE reflux 02/01/2012  . Fibrocystic breast disease 02/01/2012  . History of smoking 02/01/2012  . HTN (hypertension) 01/21/2011  . Hyperlipidemia 01/21/2011    PHYSICAL THERAPY DISCHARGE SUMMARY  Visits from Start of Care: 14 Plan: Patient agrees to discharge.  Patient goals were partially met. Patient is being discharged due to being pleased with the current functional level.  ?????      Scot Jun, PTA 08/13/2017, 12:24 PM  [place], [place], [address] Phone: [phone]   Fax:  [phone]  Name: Kristy Black MRN: [id number] Date of Birth: 04/15/1956

## 2017-09-07 ENCOUNTER — Other Ambulatory Visit: Payer: Self-pay | Admitting: Internal Medicine

## 2017-09-07 DIAGNOSIS — Z79899 Other long term (current) drug therapy: Secondary | ICD-10-CM

## 2017-09-07 DIAGNOSIS — E785 Hyperlipidemia, unspecified: Secondary | ICD-10-CM

## 2017-09-08 ENCOUNTER — Ambulatory Visit: Payer: 59 | Admitting: Vascular Surgery

## 2017-09-08 ENCOUNTER — Encounter: Payer: Self-pay | Admitting: Vascular Surgery

## 2017-09-08 VITALS — BP 166/84 | HR 70 | Temp 97.6°F | Resp 16 | Ht 68.5 in | Wt 174.0 lb

## 2017-09-08 DIAGNOSIS — I70213 Atherosclerosis of native arteries of extremities with intermittent claudication, bilateral legs: Secondary | ICD-10-CM | POA: Diagnosis not present

## 2017-09-08 NOTE — Progress Notes (Signed)
[facility] of [place]  Patient name: Kristy Black MRN: [id number] DOB: 1956-07-23 Sex: female  REASON FOR VISIT: Follow-up peripheral vascular occlusive disease  HPI: Kristy Black is a 62 y.o. female here today for follow-up from October 2018 visit.  She continues to report right leg symptoms.  Minimal difficulty with her left leg.  He continues to have several components of symptoms in her right leg.  She reports buttock and thigh with walking which is relieved at rest.  She also reports a numb sensation and some pain in her entire right leg which occurs when she is lying in bed.  She reports the sensation of a fullness in her right groin when she is lying in bed this is worse when she is lying her right or left side.  She does not notices when she is standing or straining.  Past Medical History:  Diagnosis Date  . 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 GERD (gastroesophageal reflux disease)   . Hyperlipidemia   . Hypertension     Family History  Problem Relation Age of Onset  . Cancer Sister     SOCIAL HISTORY: Social History   Tobacco Use  . Smoking status: Current Every Day Smoker    Packs/day: 0.50    Types: Cigarettes  . Smokeless tobacco: Never Used  . Tobacco comment: 10 cigarettes/day  Substance Use Topics  . Alcohol use: Yes    Comment: socially    Allergies  Allergen Reactions  . Penicillins Hives    Current Outpatient Medications  Medication Sig Dispense Refill  . Cholecalciferol (VITAMIN D3) 2000 units TABS Take 1 tablet by mouth daily.    . clotrimazole-betamethasone (LOTRISONE) cream Apply 1 application topically 2 (two) times daily. 30 g 1  . hydrochlorothiazide (HYDRODIURIL) 25 MG tablet TAKE ONE TABLET BY MOUTH ONCE DAILY 90 tablet 1  . ibuprofen (ADVIL,MOTRIN) 200 MG tablet Take 200 mg by mouth every 6 (six) hours as needed.    . metoprolol succinate (TOPROL-XL) 50 MG 24 hr tablet TAKE 1 TABLET  BY MOUTH ONCE DAILY 90 tablet 1  . rosuvastatin (CRESTOR) 5 MG tablet Take 1 tablet (5 mg total) by mouth daily. 90 tablet 0   No current facility-administered medications for this visit.     REVIEW OF SYSTEMS:  [X]  denotes positive finding, [ ]  denotes negative finding Cardiac  Comments:  Chest pain or chest pressure:    Shortness of breath upon exertion:    Short of breath when lying flat:    Irregular heart rhythm:        Vascular    Pain in calf, thigh, or hip brought on by ambulation: x   Pain in feet at night that wakes you up from your sleep:  x   Blood clot in your veins:    Leg swelling:           PHYSICAL EXAM: Vitals:   09/08/17 1215  BP: (!) 166/84  Pulse: 70  Resp: 16  Temp: 97.6 F (36.4 C)  TempSrc: Oral  SpO2: 95%  Weight: 174 lb (78.9 kg)  Height: 5' 8.5" (1.74 m)    GENERAL: The patient is a well-nourished female, in no acute distress. The vital signs are documented above. CARDIOVASCULAR: 2+ radial pulses.  1-2+ femoral pulses bilaterally.  Distal pulses.  Do not feel any masses at the area where she suggest felt at night.  No evidence of hernia or nodes PULMONARY: There is good air  exchange  MUSCULOSKELETAL: There are no major deformities or cyanosis. NEUROLOGIC: No focal weakness or paresthesias are detected. SKIN: There are no ulcers or rashes noted. PSYCHIATRIC: The patient has a normal affect.  DATA:  Prior ankle arm index 3 months ago reviewed showing ankle arm index of 0.6 on the right and 0.5 on the left with the duplex suggesting aortoiliac occlusive disease  MEDICAL ISSUES: Discussion with patient.  She is worried about this could be aneurysmal disease other life or limb threatening situation.  I explained in all likelihood she has multiple components of her right leg symptoms.  I do feel that some of this is related to arterial insufficiency with the total leg claudication.  She also has neuropathic discomfort with numbness in her leg when  lying in bed.  Explained that next step for arterial issues would be arteriography but would proceed with this only if she is severely symptoms.  Also explained I am hesitant to do this since only part of her symptoms can be explained by arterial insufficiency.  She wishes continued observation with follow-up.  She reports that she has had evaluation of her back to include MRI suggesting no correctable pathology.  We will see her in 6 months with repeat ankle arm index    Rosetta Posner, MD [facility] Tel [phone] Pager [phone]

## 2017-09-09 NOTE — Addendum Note (Signed)
Addended by: Lianne Cure A on: 09/09/2017 11:57 AM   Modules accepted: Orders

## 2017-09-14 ENCOUNTER — Other Ambulatory Visit: Payer: 59 | Admitting: Internal Medicine

## 2017-09-14 DIAGNOSIS — E785 Hyperlipidemia, unspecified: Secondary | ICD-10-CM | POA: Diagnosis not present

## 2017-09-14 DIAGNOSIS — Z79899 Other long term (current) drug therapy: Secondary | ICD-10-CM | POA: Diagnosis not present

## 2017-09-14 LAB — HEPATIC FUNCTION PANEL
AG Ratio: 1.6 (calc) (ref 1.0–2.5)
ALBUMIN MSPROF: 4.4 g/dL (ref 3.6–5.1)
ALT: 18 U/L (ref 6–29)
AST: 20 U/L (ref 10–35)
Alkaline phosphatase (APISO): 89 U/L (ref 33–130)
BILIRUBIN DIRECT: 0.1 mg/dL (ref 0.0–0.2)
Globulin: 2.8 g/dL (calc) (ref 1.9–3.7)
Indirect Bilirubin: 0.6 mg/dL (calc) (ref 0.2–1.2)
TOTAL PROTEIN: 7.2 g/dL (ref 6.1–8.1)
Total Bilirubin: 0.7 mg/dL (ref 0.2–1.2)

## 2017-09-14 LAB — LIPID PANEL
CHOLESTEROL: 140 mg/dL (ref <200)
HDL: 31 mg/dL — ABNORMAL LOW (ref >50)
LDL CHOLESTEROL (CALC): 79 mg/dL
Non-HDL Cholesterol (Calc): 109 mg/dL (calc) (ref <130)
Total CHOL/HDL Ratio: 4.5 (calc) (ref <5.0)
Triglycerides: 210 mg/dL — ABNORMAL HIGH (ref <150)

## 2017-09-15 ENCOUNTER — Encounter: Payer: Self-pay | Admitting: Internal Medicine

## 2017-09-15 ENCOUNTER — Ambulatory Visit: Payer: 59 | Admitting: Internal Medicine

## 2017-09-15 VITALS — BP 150/80 | HR 66 | Temp 98.0°F | Ht 68.5 in | Wt 175.0 lb

## 2017-09-15 DIAGNOSIS — M25551 Pain in right hip: Secondary | ICD-10-CM

## 2017-09-15 DIAGNOSIS — R202 Paresthesia of skin: Secondary | ICD-10-CM

## 2017-09-15 DIAGNOSIS — F17200 Nicotine dependence, unspecified, uncomplicated: Secondary | ICD-10-CM

## 2017-09-15 DIAGNOSIS — M898X9 Other specified disorders of bone, unspecified site: Secondary | ICD-10-CM | POA: Diagnosis not present

## 2017-09-15 DIAGNOSIS — I10 Essential (primary) hypertension: Secondary | ICD-10-CM

## 2017-09-15 DIAGNOSIS — C9000 Multiple myeloma not having achieved remission: Secondary | ICD-10-CM

## 2017-09-15 NOTE — Progress Notes (Signed)
   Subjective:    Patient ID: Kristy Black, female    DOB: 27-Aug-1956, 62 y.o.   MRN: [id number]  HPI 62 year old Female with issues with pain in right hip for several months. At night, experiences numbness in right leg unless puts pillow between legs. Saw Dr. Ninfa Linden. Was not found to have significant osteoarthritis of hip. Had MRI of LS spine and did not have significant findings to explain symptoms. Went to PT without relief.  Notes a a lump in right inguinal area if on her side.   She has seen Dr. Donnetta Hutching for evaluation of possible claudication.  He is not convinced that all of her symptoms are due to claudication.  She is a smoker but has cut back to half as much as she was smoking over the past few months.  She is concerned about a possible malignancy.  Has some pain over her right trochanter.  Initially that was injected with minimal relief.   Review of Systems she has a history of hypertension and hyperlipidemia.  She has a low HDL cholesterol of 31 and triglycerides checked recently were 210 and previously were 200.  Total cholesterol 140.      Objective:   Physical Exam  I do not feel a distinct lump in the right inguinal area.  She is tender over her right greater trochanter.  Internal and external rotation of the right hip without significant pain.  Straight leg raising is negative at 90.      Assessment & Plan:  Protracted right leg pain with paresthesias?  Occult radiculopathy  History of smoking with some evidence of claudication  Hypertension  Anxiety related to protracted right leg discomfort  Plan: She is working on cutting out smoking entirely.  She has made progress with this.  Through rheumatology studies today as well as an SPEP.  Sed rate is 28.  ANA is low titer positive at 1: 40 which I think is insignificant.  Total CK is 42.  SPEP shows poorly defined band of restricted protein mobility in the gamma globulins which I think is probably insignificant since  these are not elevated very much.  We will not do immunofixation at this time.  I am going to see if we can get a neurosurgical opinion with review of her MRI of the LS spine.  She has seen Dr. Ninfa Linden who did not feel that she had a hip problem.

## 2017-09-17 LAB — PROTEIN ELECTROPHORESIS, SERUM
ALBUMIN ELP: 4 g/dL (ref 3.8–4.8)
ALPHA 1: 0.4 g/dL — AB (ref 0.2–0.3)
Alpha 2: 1.1 g/dL — ABNORMAL HIGH (ref 0.5–0.9)
BETA 2: 0.4 g/dL (ref 0.2–0.5)
BETA GLOBULIN: 0.5 g/dL (ref 0.4–0.6)
Gamma Globulin: 0.9 g/dL (ref 0.8–1.7)
Total Protein: 7.2 g/dL (ref 6.1–8.1)

## 2017-09-17 LAB — ANTI-NUCLEAR AB-TITER (ANA TITER): ANA Titer 1: 1:40 {titer} — ABNORMAL HIGH

## 2017-09-17 LAB — ANA: ANA: POSITIVE — AB

## 2017-09-17 LAB — CK, TOTAL(REFL): Total CK: 42 U/L (ref 29–143)

## 2017-09-17 LAB — SEDIMENTATION RATE: Sed Rate: 28 mm/h (ref 0–30)

## 2017-09-20 NOTE — Patient Instructions (Signed)
Rheumatology studies done today.  Neurosurgery referral made.

## 2017-10-02 ENCOUNTER — Other Ambulatory Visit: Payer: Self-pay | Admitting: Internal Medicine

## 2017-10-02 ENCOUNTER — Other Ambulatory Visit: Payer: Self-pay

## 2017-10-02 MED ORDER — ROSUVASTATIN CALCIUM 10 MG PO TABS: 10.0000 mg | ORAL_TABLET | Freq: Every day | ORAL | 0 refills | Status: DC

## 2017-10-07 ENCOUNTER — Ambulatory Visit
Admission: RE | Admit: 2017-10-07 | Discharge: 2017-10-07 | Disposition: A | Discharge status: Home / Self Care | Payer: 59 | Source: Ambulatory Visit | Attending: Internal Medicine | Admitting: Internal Medicine

## 2017-10-07 DIAGNOSIS — M25551 Pain in right hip: Secondary | ICD-10-CM

## 2017-10-08 ENCOUNTER — Telehealth: Payer: Self-pay

## 2017-10-08 NOTE — Telephone Encounter (Signed)
Have spoken with Crystal about the need for Iv contrast describing lump in inguinal area and protracted pain for several months. Not just an ortho issue but maybe occult adenopathy? Pt canceeled MRI yesterday c/o cough and URI symptoms

## 2017-10-08 NOTE — Telephone Encounter (Signed)
Crystal from [facility] called "does patient really an MRI with contrast"? She said normally for a "labral taer" no contrast is needed.  Call back number: [phone]

## 2017-10-19 DIAGNOSIS — M7061 Trochanteric bursitis, right hip: Secondary | ICD-10-CM | POA: Diagnosis not present

## 2017-10-19 DIAGNOSIS — M5416 Radiculopathy, lumbar region: Secondary | ICD-10-CM | POA: Diagnosis not present

## 2017-10-19 DIAGNOSIS — M545 Low back pain: Secondary | ICD-10-CM | POA: Diagnosis not present

## 2017-12-07 ENCOUNTER — Other Ambulatory Visit: Payer: Self-pay | Admitting: Internal Medicine

## 2018-01-05 ENCOUNTER — Other Ambulatory Visit: Payer: Self-pay | Admitting: Internal Medicine

## 2018-01-05 ENCOUNTER — Encounter: Payer: Self-pay | Admitting: Internal Medicine

## 2018-01-06 ENCOUNTER — Other Ambulatory Visit: Payer: Self-pay | Admitting: Internal Medicine

## 2018-01-06 DIAGNOSIS — R202 Paresthesia of skin: Secondary | ICD-10-CM

## 2018-01-18 ENCOUNTER — Other Ambulatory Visit: Payer: Self-pay

## 2018-01-18 ENCOUNTER — Encounter: Payer: Self-pay | Admitting: Neurology

## 2018-01-18 ENCOUNTER — Ambulatory Visit: Payer: 59 | Admitting: Neurology

## 2018-01-18 DIAGNOSIS — I739 Peripheral vascular disease, unspecified: Secondary | ICD-10-CM

## 2018-01-18 DIAGNOSIS — I70219 Atherosclerosis of native arteries of extremities with intermittent claudication, unspecified extremity: Secondary | ICD-10-CM | POA: Insufficient documentation

## 2018-01-18 HISTORY — DX: Peripheral vascular disease, unspecified: I73.9

## 2018-01-18 NOTE — Progress Notes (Signed)
Reason for visit: Right leg pain and numbness  Referring physician: Dr. Susa Loffler is a 62 y.o. female  History of present illness:  [name] is a 62 year old right-handed white female with a history of tobacco abuse and a claudication syndrome affecting the right hip and buttock area.  This developed about 1 year ago and has gradually worsened over time.  The patient indicates that she is able to take about 100 steps and then she developed discomfort in the hip and buttock area on the right.  If she stops and rests the pain will go away, she does not have to sit down in order for this to happen.  The patient has also noted that when she sleeps on her right side she may note some right hip pain as well, and she may have some numbness sensations going down the entirety of the right leg to the foot.  If she uses a pillow between her knees, she can prevent this problem.  She does not have any numbness or weakness of the legs on either side when she is up doing things.  She denies any back pain.  She has had MRI of the lumbar spine that is essentially normal.  She was set up for MRI of the hip, but became claustrophobic and could not complete the study.  The patient reports no persistent pain or numbness.  She has not had any weakness of the legs, she denies any difficulty balance or difficulty controlling the bowels or the bladder.  The patient did have a vascular work-up that showed 50% stenosis of the left common iliac artery and complete occlusion of the right common iliac vessel.  She has been seen through vascular surgery, surgery was not recommended.  Past Medical History:  Diagnosis Date  . 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 GERD (gastroesophageal reflux disease)   . Hyperlipidemia   . Hypertension     Past Surgical History:  Procedure Laterality Date  . BREAST SURGERY     l breast biopsy  . CARDIAC VALVE REPLACEMENT    . CESAREAN SECTION    . TUBAL LIGATION       Family History  Problem Relation Age of Onset  . Cancer Sister     Social history:  reports that she has been smoking cigarettes.  She has been smoking about 0.50 packs per day. She has never used smokeless tobacco. She reports that she drinks alcohol. She reports that she does not use drugs.  Medications:  Prior to Admission medications   Medication Sig Start Date End Date Taking? Authorizing Provider  Cholecalciferol (VITAMIN D3) 2000 units TABS Take 1 tablet by mouth daily.   Yes [provider]  hydrochlorothiazide (HYDRODIURIL) 25 MG tablet TAKE 1 TABLET BY MOUTH ONCE DAILY 10/02/17  Yes Baxley, Cresenciano Lick, MD  metoprolol succinate (TOPROL-XL) 50 MG 24 hr tablet TAKE 1 TABLET BY MOUTH ONCE DAILY 12/07/17  Yes Baxley, Cresenciano Lick, MD  polyethylene glycol (MIRALAX / GLYCOLAX) packet Take 17 g by mouth daily.   Yes [provider]  rosuvastatin (CRESTOR) 10 MG tablet TAKE 1 TABLET BY MOUTH ONCE DAILY 01/05/18  Yes Baxley, Cresenciano Lick, MD  clotrimazole-betamethasone (LOTRISONE) cream Apply 1 application topically 2 (two) times daily. Patient not taking: Reported on 01/18/2018 01/17/16   Elby Showers, MD  ibuprofen (ADVIL,MOTRIN) 200 MG tablet Take 200 mg by mouth every 6 (six) hours as needed.    [provider]  Allergies  Allergen Reactions  . Penicillins Hives    ROS:  Out of a complete 14 system review of symptoms, the patient complains only of the following symptoms, and all other reviewed systems are negative.  Aching muscles Numbness  Blood pressure (!) 147/76, pulse 78, height 5\' 7"  (1.702 m), weight 176 lb (79.8 kg).  Physical Exam  General: The patient is alert and cooperative at the time of the examination.  Eyes: Pupils are equal, round, and reactive to light. Discs are flat bilaterally.  Neck: The neck is supple, no carotid bruits are noted.  Respiratory: The respiratory examination is clear.  Cardiovascular: The cardiovascular examination  reveals a regular rate and rhythm, no obvious murmurs or rubs are noted.  Neuromuscular: Range move the low back is full.  Internal and external rotation of the hips resulted in no discomfort.  Skin: Extremities are without significant edema.  Neurologic Exam  Mental status: The patient is alert and oriented x 3 at the time of the examination. The patient has apparent normal recent and remote memory, with an apparently normal attention span and concentration ability.  Cranial nerves: Facial symmetry is present. There is good sensation of the face to pinprick and soft touch bilaterally. The strength of the facial muscles and the muscles to head turning and shoulder shrug are normal bilaterally. Speech is well enunciated, no aphasia or dysarthria is noted. Extraocular movements are full. Visual fields are full. The tongue is midline, and the patient has symmetric elevation of the soft palate. No obvious hearing deficits are noted.  Motor: The motor testing reveals 5 over 5 strength of all 4 extremities. Good symmetric motor tone is noted throughout.  Sensory: Sensory testing is intact to pinprick, soft touch, vibration sensation, and position sense on all 4 extremities. No evidence of extinction is noted.  Coordination: Cerebellar testing reveals good finger-nose-finger and heel-to-shin bilaterally.  Gait and station: Gait is normal. Tandem gait is normal. Romberg is negative. No drift is seen.  Reflexes: Deep tendon reflexes are symmetric and normal bilaterally. Toes are downgoing bilaterally.   Assessment/Plan:  1.  Claudication syndrome, right common iliac artery occlusion  2.  Right leg discomfort and paresthesias  The patient appears to have a claudication syndrome that likely correlates with the right iliac artery occlusion.  It is not clear why the patient is having intermittent right hip pain and numbness in the right leg when laying on her right side, MRI of the lumbar spine does  not reveal evidence of nerve root impingement.  We have discussed the possibility of performing EMG nerve conduction study evaluation, the patient does not wish to pursue this study as this is likely to be of low yield.  I do think that getting an MRI of the hip area is reasonable, the patient may need to get this study rescheduled.  The patient will follow-up through this office if needed, I have once again encouraged her to completely stop smoking.  Jill Alexanders MD 01/18/2018 3:32 PM  [facility] [address] [place], [place] [address]  Phone [phone] Fax [phone]

## 2018-03-23 ENCOUNTER — Encounter: Payer: Self-pay | Admitting: Vascular Surgery

## 2018-03-23 ENCOUNTER — Ambulatory Visit: Payer: 59 | Admitting: Vascular Surgery

## 2018-03-23 ENCOUNTER — Ambulatory Visit (HOSPITAL_COMMUNITY)
Admission: RE | Admit: 2018-03-23 | Discharge: 2018-03-23 | Disposition: A | Discharge status: Home / Self Care | Payer: 59 | Source: Ambulatory Visit | Attending: Vascular Surgery | Admitting: Vascular Surgery

## 2018-03-23 VITALS — BP 148/79 | HR 66 | Temp 98.2°F | Resp 16 | Ht 67.0 in | Wt 177.0 lb

## 2018-03-23 DIAGNOSIS — I70213 Atherosclerosis of native arteries of extremities with intermittent claudication, bilateral legs: Secondary | ICD-10-CM | POA: Diagnosis not present

## 2018-03-23 DIAGNOSIS — R0989 Other specified symptoms and signs involving the circulatory and respiratory systems: Secondary | ICD-10-CM | POA: Insufficient documentation

## 2018-03-23 DIAGNOSIS — R9389 Abnormal findings on diagnostic imaging of other specified body structures: Secondary | ICD-10-CM | POA: Diagnosis not present

## 2018-03-23 DIAGNOSIS — I10 Essential (primary) hypertension: Secondary | ICD-10-CM | POA: Insufficient documentation

## 2018-03-23 DIAGNOSIS — E785 Hyperlipidemia, unspecified: Secondary | ICD-10-CM | POA: Insufficient documentation

## 2018-03-23 DIAGNOSIS — F17200 Nicotine dependence, unspecified, uncomplicated: Secondary | ICD-10-CM | POA: Diagnosis not present

## 2018-03-23 NOTE — Progress Notes (Signed)
Vascular and Vein Specialist of [place]  Patient name: Kristy Black MRN: [id number] DOB: Feb 21, 1956 Sex: female  REASON FOR VISIT: Continued discussion right leg claudication  HPI: Kristy Black is a 62 y.o. female here for continued discussion of right leg claudication.  She reports that this is no worse than it has been in the past.  It is very limiting to her but she has had no tissue loss and no rest pain.  She reports that with a very short distance of walking she has gained sensation in her right leg.  Minimal discomfort in her left leg.  This is relieved by rest.  She also reports typical nocturnal cramping in her right foot and I explained that this is not related to arterial insufficiency.  She also continues to have a persistent discomfort in her right inguinal region not related to exercise.  Does continue to smoke but reports that she is decreased her cigarette use dramatically  Past Medical History:  Diagnosis Date  . Claudication of gluteal region ([place]) 01/18/2018  . 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 GERD (gastroesophageal reflux disease)   . Hyperlipidemia   . Hypertension     Family History  Problem Relation Age of Onset  . Cancer Sister     SOCIAL HISTORY: Social History   Tobacco Use  . Smoking status: Current Every Day Smoker    Packs/day: 0.50    Types: Cigarettes  . Smokeless tobacco: Never Used  . Tobacco comment: 10 cigarettes/day  Substance Use Topics  . Alcohol use: Yes    Comment: socially    Allergies  Allergen Reactions  . Penicillins Hives    Current Outpatient Medications  Medication Sig Dispense Refill  . Cholecalciferol (VITAMIN D3) 2000 units TABS Take 1 tablet by mouth daily.    . hydrochlorothiazide (HYDRODIURIL) 25 MG tablet TAKE 1 TABLET BY MOUTH ONCE DAILY 90 tablet 1  . ibuprofen (ADVIL,MOTRIN) 200 MG tablet Take 200 mg by mouth every 6 (six) hours as needed.    . metoprolol succinate  (TOPROL-XL) 50 MG 24 hr tablet TAKE 1 TABLET BY MOUTH ONCE DAILY 90 tablet 1  . polyethylene glycol (MIRALAX / GLYCOLAX) packet Take 17 g by mouth daily.    . rosuvastatin (CRESTOR) 10 MG tablet TAKE 1 TABLET BY MOUTH ONCE DAILY 90 tablet 0  . clotrimazole-betamethasone (LOTRISONE) cream Apply 1 application topically 2 (two) times daily. (Patient not taking: Reported on 01/18/2018) 30 g 1   No current facility-administered medications for this visit.     REVIEW OF SYSTEMS:  [X]  denotes positive finding, [ ]  denotes negative finding Cardiac  Comments:  Chest pain or chest pressure:    Shortness of breath upon exertion:    Short of breath when lying flat:    Irregular heart rhythm:        Vascular    Pain in calf, thigh, or hip brought on by ambulation: x   Pain in feet at night that wakes you up from your sleep:  x   Blood clot in your veins:    Leg swelling:           PHYSICAL EXAM: Vitals:   03/23/18 1149 03/23/18 1152  BP: (!) 150/74 (!) 148/79  Pulse: 66   Resp: 16   Temp: 98.2 F (36.8 C)   SpO2: 95%   Weight: 177 lb (80.3 kg)   Height: 5\' 7"  (1.702 m)     GENERAL: The patient is a well-nourished  female, in no acute distress. The vital signs are documented above. CARDIOVASCULAR: Carotid arteries without bruits bilaterally.  2+ radial pulses bilaterally some tenderness in her right groin with no mass noted.  No palpable femoral pulses bilaterally PULMONARY: There is good air exchange  MUSCULOSKELETAL: There are no major deformities or cyanosis. NEUROLOGIC: No focal weakness or paresthesias are detected. SKIN: There are no ulcers or rashes noted. PSYCHIATRIC: The patient has a normal affect.  DATA:  Noninvasive studies today reveal ankle arm index of 0.42 on the right and 0.54 on the left with monophasic waveforms in the foot  MEDICAL ISSUES: Discussed this at length with patient again.  I did explain that her aortoiliac occlusive disease is the cause for her walking  symptoms.  Again explained that her resting symptoms could not be explained by the arterial insufficiency.  She reports that she is comfortable with her current level of claudication and wishes continued observation only.  She will notify should she develop any worsening symptoms or nonhealing ulcerations on her feet.  Otherwise we will see her again with repeat noninvasive studies in 1 year    Rosetta Posner, MD [facility] and Ve[facility] Specialists of [facility] [place] Tel [phone] Pager [phone]

## 2018-04-06 ENCOUNTER — Other Ambulatory Visit: Payer: Self-pay | Admitting: Internal Medicine

## 2018-04-12 ENCOUNTER — Telehealth: Payer: Self-pay | Admitting: Internal Medicine

## 2018-04-12 NOTE — Telephone Encounter (Signed)
Made patient CPE for October.  Would you please refill her HCTZ and Crestor?  She took her last pills today.    Pharmacy:  [facility]  Refills to get her through to the end of October.  She'll get her years worth at her CPE I'm sure.    Thank you so much.

## 2018-04-13 MED ORDER — HYDROCHLOROTHIAZIDE 25 MG PO TABS: 25.0000 mg | ORAL_TABLET | Freq: Every day | ORAL | 0 refills | Status: DC

## 2018-04-13 NOTE — Telephone Encounter (Signed)
Escribed

## 2018-04-15 ENCOUNTER — Other Ambulatory Visit: Payer: Self-pay | Admitting: Internal Medicine

## 2018-05-22 DIAGNOSIS — Z23 Encounter for immunization: Secondary | ICD-10-CM | POA: Diagnosis not present

## 2018-06-07 ENCOUNTER — Other Ambulatory Visit: Payer: Self-pay | Admitting: Internal Medicine

## 2018-06-07 DIAGNOSIS — Z8659 Personal history of other mental and behavioral disorders: Secondary | ICD-10-CM

## 2018-06-07 DIAGNOSIS — E781 Pure hyperglyceridemia: Secondary | ICD-10-CM

## 2018-06-07 DIAGNOSIS — Z0000 Encounter for general adult medical examination without abnormal findings: Secondary | ICD-10-CM

## 2018-06-07 DIAGNOSIS — K219 Gastro-esophageal reflux disease without esophagitis: Secondary | ICD-10-CM

## 2018-06-07 DIAGNOSIS — M7918 Myalgia, other site: Secondary | ICD-10-CM

## 2018-06-07 DIAGNOSIS — N6019 Diffuse cystic mastopathy of unspecified breast: Secondary | ICD-10-CM

## 2018-06-07 DIAGNOSIS — K5904 Chronic idiopathic constipation: Secondary | ICD-10-CM

## 2018-06-10 ENCOUNTER — Other Ambulatory Visit: Payer: 59 | Admitting: Internal Medicine

## 2018-06-10 DIAGNOSIS — K5904 Chronic idiopathic constipation: Secondary | ICD-10-CM

## 2018-06-10 DIAGNOSIS — M7918 Myalgia, other site: Secondary | ICD-10-CM | POA: Diagnosis not present

## 2018-06-10 DIAGNOSIS — Z8659 Personal history of other mental and behavioral disorders: Secondary | ICD-10-CM

## 2018-06-10 DIAGNOSIS — E781 Pure hyperglyceridemia: Secondary | ICD-10-CM

## 2018-06-10 DIAGNOSIS — N6019 Diffuse cystic mastopathy of unspecified breast: Secondary | ICD-10-CM

## 2018-06-10 DIAGNOSIS — K219 Gastro-esophageal reflux disease without esophagitis: Secondary | ICD-10-CM

## 2018-06-10 DIAGNOSIS — Z0000 Encounter for general adult medical examination without abnormal findings: Secondary | ICD-10-CM

## 2018-06-11 LAB — COMPLETE METABOLIC PANEL WITH GFR
AG Ratio: 1.5 (calc) (ref 1.0–2.5)
ALKALINE PHOSPHATASE (APISO): 85 U/L (ref 33–130)
ALT: 19 U/L (ref 6–29)
AST: 21 U/L (ref 10–35)
Albumin: 4.1 g/dL (ref 3.6–5.1)
BUN: 10 mg/dL (ref 7–25)
CO2: 28 mmol/L (ref 20–32)
CREATININE: 0.68 mg/dL (ref 0.50–1.05)
Calcium: 9.8 mg/dL (ref 8.6–10.4)
Chloride: 99 mmol/L (ref 98–110)
GFR, EST AFRICAN AMERICAN: 111 mL/min/{1.73_m2} (ref >=60)
GFR, Est Non African American: 96 mL/min/{1.73_m2} (ref >=60)
GLUCOSE: 100 mg/dL — AB (ref 65–99)
Globulin: 2.7 g/dL (calc) (ref 1.9–3.7)
Potassium: 4.3 mmol/L (ref 3.5–5.3)
Sodium: 138 mmol/L (ref 135–146)
TOTAL PROTEIN: 6.8 g/dL (ref 6.1–8.1)
Total Bilirubin: 0.6 mg/dL (ref 0.2–1.2)

## 2018-06-11 LAB — LIPID PANEL
Cholesterol: 130 mg/dL
HDL: 33 mg/dL — ABNORMAL LOW
LDL Cholesterol (Calc): 73 mg/dL
Non-HDL Cholesterol (Calc): 97 mg/dL
Total CHOL/HDL Ratio: 3.9 (calc)
Triglycerides: 154 mg/dL — ABNORMAL HIGH

## 2018-06-11 LAB — CBC WITH DIFFERENTIAL/PLATELET
Basophils Absolute: 61 {cells}/uL (ref 0–200)
Basophils Relative: 0.5 %
Eosinophils Absolute: 145 {cells}/uL (ref 15–500)
Eosinophils Relative: 1.2 %
HCT: 47.9 % — ABNORMAL HIGH (ref 35.0–45.0)
Hemoglobin: 16.2 g/dL — ABNORMAL HIGH (ref 11.7–15.5)
Lymphs Abs: 3908 {cells}/uL — ABNORMAL HIGH (ref 850–3900)
MCH: 28.9 pg (ref 27.0–33.0)
MCHC: 33.8 g/dL (ref 32.0–36.0)
MCV: 85.5 fL (ref 80.0–100.0)
MPV: 10.1 fL (ref 7.5–12.5)
Monocytes Relative: 6.8 %
Neutro Abs: 7163 {cells}/uL (ref 1500–7800)
Neutrophils Relative %: 59.2 %
Platelets: 302 10*3/uL (ref 140–400)
RBC: 5.6 Million/uL — ABNORMAL HIGH (ref 3.80–5.10)
RDW: 13.2 % (ref 11.0–15.0)
Total Lymphocyte: 32.3 %
WBC mixed population: 823 {cells}/uL (ref 200–950)
WBC: 12.1 10*3/uL — ABNORMAL HIGH (ref 3.8–10.8)

## 2018-06-11 LAB — TSH: TSH: 1.75 m[IU]/L (ref 0.40–4.50)

## 2018-06-11 LAB — VITAMIN D 25 HYDROXY (VIT D DEFICIENCY, FRACTURES): Vit D, 25-Hydroxy: 44 ng/mL (ref 30–100)

## 2018-06-15 ENCOUNTER — Ambulatory Visit (INDEPENDENT_AMBULATORY_CARE_PROVIDER_SITE_OTHER): Payer: 59 | Admitting: Internal Medicine

## 2018-06-15 ENCOUNTER — Encounter: Payer: Self-pay | Admitting: Internal Medicine

## 2018-06-15 VITALS — BP 130/70 | HR 76 | Ht 67.0 in | Wt 179.0 lb

## 2018-06-15 DIAGNOSIS — K219 Gastro-esophageal reflux disease without esophagitis: Secondary | ICD-10-CM

## 2018-06-15 DIAGNOSIS — E7800 Pure hypercholesterolemia, unspecified: Secondary | ICD-10-CM

## 2018-06-15 DIAGNOSIS — K5904 Chronic idiopathic constipation: Secondary | ICD-10-CM | POA: Diagnosis not present

## 2018-06-15 DIAGNOSIS — Z0000 Encounter for general adult medical examination without abnormal findings: Secondary | ICD-10-CM

## 2018-06-15 DIAGNOSIS — N6019 Diffuse cystic mastopathy of unspecified breast: Secondary | ICD-10-CM

## 2018-06-15 DIAGNOSIS — F17200 Nicotine dependence, unspecified, uncomplicated: Secondary | ICD-10-CM

## 2018-06-15 DIAGNOSIS — J22 Unspecified acute lower respiratory infection: Secondary | ICD-10-CM

## 2018-06-15 DIAGNOSIS — Z8659 Personal history of other mental and behavioral disorders: Secondary | ICD-10-CM | POA: Diagnosis not present

## 2018-06-15 DIAGNOSIS — I739 Peripheral vascular disease, unspecified: Secondary | ICD-10-CM

## 2018-06-15 DIAGNOSIS — I10 Essential (primary) hypertension: Secondary | ICD-10-CM

## 2018-06-15 DIAGNOSIS — M7918 Myalgia, other site: Secondary | ICD-10-CM

## 2018-06-15 LAB — POCT URINALYSIS DIPSTICK
Appearance: NORMAL
Bilirubin, UA: NEGATIVE
Glucose, UA: NEGATIVE
KETONES UA: NEGATIVE
LEUKOCYTES UA: NEGATIVE
NITRITE UA: NEGATIVE
ODOR: NORMAL
PH UA: 6.5 (ref 5.0–8.0)
PROTEIN UA: NEGATIVE
Spec Grav, UA: 1.01 (ref 1.010–1.025)
UROBILINOGEN UA: 0.2 U/dL

## 2018-06-15 MED ORDER — LEVOFLOXACIN 500 MG PO TABS: 500.0000 mg | ORAL_TABLET | Freq: Every day | ORAL | 0 refills | Status: DC

## 2018-06-15 NOTE — Progress Notes (Signed)
Subjective:    Patient ID: Kristy Black, female    DOB: [birth year], 62 y.o.   MRN: [id number]  HPI 62 year old Female for health maintenance exam and evaluation of medical issues.  History of smoking and hypertension.  Long-standing history of constipation treated with MiraLAX.  History of anxiety.  History of hyperlipidemia.  History of GE reflux.  Continues to smoke and is smoked for well over 20 years.  Smoking cessation has been discussed in the past and patient had not expressed interest but recently has cut back considerably.  Left breast fibroadenoma removed in 1995, right breast fibroadenoma removed in 2000.  C-section 1991.  Bilateral tubal ligation 1995.  Benign left breast biopsy 2007.  Tetanus immunization was done through the health department in the fall 2012.  Social history: She is retired from the health department.  She is married.  Has 1 adult daughter.  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  She has 1 granddaughter.  Family history: Father with history of diabetes, coronary artery disease, hypertension died at age 32 of a sudden MI.  Mother recently passed away at age 67 with history of dementia, hypertension, congestive heart failure.  She had a pacemaker.  Patient has 2 brothers.  One sister died at age 54 of promyelocytic leukemia.  One sister living in good health.    Review of Systems  Constitutional: Negative.   Respiratory:       Has come down with recent respiratory infection and is coughing profusely in the office.  Husband had the illness initially.  Cardiovascular: Negative.   Neurological: Negative.   Psychiatric/Behavioral: Negative.    back in the Spring of 2018, we began to evaluate her for complaint of right hip pain.  Dr. Donnetta Hutching continues to follow her for right leg claudication.  He reports it is very limiting.  She says she can only walk a short distance without discomfort.  Discomfort is relieved by rest.  Has right  foot cramp at night.  I do not think this is related nor does Dr. Donnetta Hutching think so either. She had an MRI of the LS spine after 3 months of right hip/back pain and this proved to be negative.   Hip and LS-spine films were unremarkable.  She saw orthopedist.  Due for mammogram-last on file was 2017   Objective:   Physical Exam  Constitutional: She is oriented to person, place, and time. No distress.  HENT:  Head: Normocephalic and atraumatic.  Pharynx slightly injected.  TMs slightly full.  Eyes: Pupils are equal, round, and reactive to light. Conjunctivae are normal. Right eye exhibits no discharge. Left eye exhibits no discharge. No scleral icterus.  Neck: No JVD present. No thyromegaly present.  Cardiovascular: Normal rate, regular rhythm and normal heart sounds.  No murmur heard. Pulmonary/Chest: Effort normal and breath sounds normal. She has no wheezes. She has no rales.  Breasts normal female  Abdominal: Soft. Bowel sounds are normal. She exhibits no distension and no mass. There is no tenderness. There is no rebound and no guarding.  Genitourinary:  Genitourinary Comments: Pap taken in 2018.  Bimanual normal.  Musculoskeletal: She exhibits no edema.  Straight leg raising is negative at 90 degrees bilaterally  Lymphadenopathy:    She has no cervical adenopathy.  Neurological: She is alert and oriented to person, place, and time. She displays normal reflexes. No cranial nerve deficit. She exhibits normal muscle tone. Coordination normal.  Skin: Skin is warm and  dry. No rash noted. She is not diaphoretic.  Psychiatric: She has a normal mood and affect. Her behavior is normal. Judgment and thought content normal.  Vitals reviewed.         Assessment & Plan:  Right hip/buttock pain- has had negative MRI of LS-spine and negative hip films  Minimal right lower extremity claudication followed by Dr. Donnetta Hutching  History of smoking- has cut back considerably  Acute lower respiratory  infection-coughing a lot-Levaquin prescribed  History of anxiety  History of vitamin D deficiency  Essential hypertension-continues on current medications  Hyperlipidemia-continue statin medication.  Total cholesterol and LDL cholesterol normal.  Very slight elevation of triglycerides at 154 but better than several months ago when it was 210  GE reflux  Functional constipation  History of fibrocystic breast disease  Plan: Levaquin 500 mg daily for 10 days.  Recommend annual mammogram.  Continue vitamin D supplement.  Continue follow-up with Dr. Donnetta Hutching.  Continue to monitor blood pressure at home.  Return in April for 30-month recheck and follow-up on hyperlipidemia and other medical issues.

## 2018-07-05 ENCOUNTER — Other Ambulatory Visit: Payer: Self-pay | Admitting: Internal Medicine

## 2018-07-28 ENCOUNTER — Other Ambulatory Visit: Payer: Self-pay | Admitting: Internal Medicine

## 2018-08-16 ENCOUNTER — Telehealth: Payer: Self-pay | Admitting: Internal Medicine

## 2018-08-16 MED ORDER — LEVOFLOXACIN 500 MG PO TABS: 500.0000 mg | ORAL_TABLET | Freq: Every day | ORAL | 1 refills | Status: DC

## 2018-08-16 NOTE — Telephone Encounter (Addendum)
Patient has sinus infection x 1 week w/draining sinuses and headache.  No fever, slight sore throat.    Pharm[facility]:  [facility] [address]  She said to remind you that she cannot do Penicillins.    Phone:  [phone] (she only needs to hear back from [place] IF we cannot call something in for her).  Called in as requested with one refill

## 2018-09-29 DIAGNOSIS — M205X1 Other deformities of toe(s) (acquired), right foot: Secondary | ICD-10-CM | POA: Diagnosis not present

## 2018-09-29 DIAGNOSIS — M79675 Pain in left toe(s): Secondary | ICD-10-CM | POA: Diagnosis not present

## 2018-09-29 DIAGNOSIS — M205X2 Other deformities of toe(s) (acquired), left foot: Secondary | ICD-10-CM | POA: Diagnosis not present

## 2018-10-01 ENCOUNTER — Other Ambulatory Visit: Payer: Self-pay | Admitting: Internal Medicine

## 2018-10-06 ENCOUNTER — Encounter: Payer: Self-pay | Admitting: Internal Medicine

## 2018-10-06 ENCOUNTER — Telehealth: Payer: Self-pay | Admitting: Internal Medicine

## 2018-10-06 MED ORDER — GABAPENTIN 100 MG PO CAPS: ORAL_CAPSULE | ORAL | 0 refills | Status: DC

## 2018-10-06 NOTE — Telephone Encounter (Signed)
Saw podistrist who did not want to treat second toe. Has toe nail fungus great toe. Second toe likely involved as well as it is thickened and raised. She thinks it is ingrown. Refer to [facility]. She has claudication under the care of Dr. Donnetta Hutching but surgery not needed at this time per Dr. Donnetta Hutching. See report in Epic.  Has numbness right lateral leg when on toilet or in bed at night. No lumbar disc. 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 Try gabapentin 100- 300 mg hs

## 2018-11-07 ENCOUNTER — Other Ambulatory Visit: Payer: Self-pay | Admitting: Internal Medicine

## 2018-12-21 ENCOUNTER — Other Ambulatory Visit: Payer: 59 | Admitting: Internal Medicine

## 2018-12-21 ENCOUNTER — Other Ambulatory Visit: Payer: Self-pay

## 2018-12-21 DIAGNOSIS — E785 Hyperlipidemia, unspecified: Secondary | ICD-10-CM | POA: Diagnosis not present

## 2018-12-22 LAB — HEPATIC FUNCTION PANEL
AG Ratio: 1.6 (calc) (ref 1.0–2.5)
ALT: 19 U/L (ref 6–29)
AST: 21 U/L (ref 10–35)
Albumin: 4.1 g/dL (ref 3.6–5.1)
Alkaline phosphatase (APISO): 84 U/L (ref 37–153)
Bilirubin, Direct: 0.1 mg/dL (ref 0.0–0.2)
Globulin: 2.6 g/dL (calc) (ref 1.9–3.7)
Indirect Bilirubin: 0.4 mg/dL (calc) (ref 0.2–1.2)
Total Bilirubin: 0.5 mg/dL (ref 0.2–1.2)
Total Protein: 6.7 g/dL (ref 6.1–8.1)

## 2018-12-22 LAB — LIPID PANEL
Cholesterol: 133 mg/dL (ref <200)
HDL: 33 mg/dL — ABNORMAL LOW (ref >=50)
LDL Cholesterol (Calc): 67 mg/dL (calc)
Non-HDL Cholesterol (Calc): 100 mg/dL (calc) (ref <130)
Total CHOL/HDL Ratio: 4 (calc) (ref <5.0)
Triglycerides: 236 mg/dL — ABNORMAL HIGH (ref <150)

## 2018-12-23 ENCOUNTER — Encounter: Payer: Self-pay | Admitting: Internal Medicine

## 2018-12-23 ENCOUNTER — Ambulatory Visit (INDEPENDENT_AMBULATORY_CARE_PROVIDER_SITE_OTHER): Payer: 59 | Admitting: Internal Medicine

## 2018-12-23 VITALS — BP 134/73 | HR 75 | Wt 170.0 lb

## 2018-12-23 DIAGNOSIS — Z87891 Personal history of nicotine dependence: Secondary | ICD-10-CM

## 2018-12-23 DIAGNOSIS — G609 Hereditary and idiopathic neuropathy, unspecified: Secondary | ICD-10-CM

## 2018-12-23 DIAGNOSIS — E782 Mixed hyperlipidemia: Secondary | ICD-10-CM | POA: Diagnosis not present

## 2018-12-23 DIAGNOSIS — I10 Essential (primary) hypertension: Secondary | ICD-10-CM | POA: Diagnosis not present

## 2018-12-23 DIAGNOSIS — M25551 Pain in right hip: Secondary | ICD-10-CM | POA: Diagnosis not present

## 2018-12-23 NOTE — Progress Notes (Addendum)
   Subjective:    Patient ID: Kristy Black, female    DOB: 1956/04/07, 63 y.o.   MRN: [id number]  HPI 63 year old Female in today for 66-month follow-up of multiple medical issues including hypertension, hyperlipidemia, GE reflux and chronic back pain/vascular disease.    She has had extensive work-up including vascular consultation with Dr. Donnetta Hutching who did not recommend surgery.  She has had an MRI ordered of her hip but has not had that done.  She did have an MRI of the LS spine ordered by orthopedist which was normal.  She had a plain x-ray of the right hip that showed no significant arthritic changes.  Patient seen by audio and video telecommunications today.  2 identifiers were used to identify the patient as Kristy Black, a longstanding patient in this practice.  Patient agrees to visit in this format today due to the Coronavirus pandemic.   She complains bitterly of inability to walk long distances and numbness in her feet. She is thought to have some element of claudication due to smoking.  However she has not been found to have lumbar disc disease causing symptoms.  She does not have macrocytosis.  She does not have anemia.  However I do not see that B12 level has been checked.  Folate level can be checked as well due to peripheral neuropathy symptoms.  Sometimes she does have numbness in her feet but not all the time.  At night she has considerable pain in her right buttock and hip area.  At this point I am not quite sure how to proceed other than with gabapentin which she did not want to take.  We can check B12 and folate levels.  With regard to hypertension is stable on current regimen and we will not make any changes.  She is on Crestor 10 mg daily.  In October triglycerides were 154 and they are now 236.  Probably not getting much exercise and staying at home more.  Total cholesterol is 133, she has a low HDL cholesterol of 33, LDL cholesterol is normal at 67.  Previously in October  triglycerides were 154 so I think this may be a matter of diet and exercise.  We will continue to monitor.   Review of Systems in February gabapentin was prescribed for pain control but she is not taking this.     Objective:   Physical Exam Not examined as she is seen in virtual format today.  Long discussion regarding her back pain and claudication symptoms and how to go from here.  Is noting numbness in feet at times.      Assessment & Plan:  ?  Peripheral neuropathy  History of smoking and probable claudication  Right hip pain  Essential hypertension  Hypertriglyceridemia despite current statin therapy.  Needs to watch diet and get some exercise if possible.  Plan: Needs B12 and folate levels checked.  Would like for her to try gabapentin and increase dose gradually.  I think that might help her pain.  Continue statin medication and antihypertensive medication.  Follow-up in the Fall.  Encourage follow-up with Dr. Donnetta Hutching  25 minutes spent with patient discussing the symptoms listed above including treatment and evaluation that has been done previously and could be done in the future.  Time spent reviewing records in addition to time spent with her during office visit

## 2018-12-30 NOTE — Patient Instructions (Signed)
Have B12 and folate levels checked.  Follow-up at time of physical exam in 6 months.  Consider neurology evaluation and possibly?  Nerve conduction studies if neuropathy symptoms persist

## 2018-12-31 ENCOUNTER — Telehealth: Payer: Self-pay | Admitting: Internal Medicine

## 2018-12-31 NOTE — Telephone Encounter (Signed)
Kristy Black called back and scheduled labs for Monday and will set up virtual visit for OV and also schedule CPE and labs for after 04/16/19

## 2018-12-31 NOTE — Telephone Encounter (Signed)
Lvm to CB and schedule appointment for labs B12, folate level due to foot numbness Dx: periplevel neuropathy and CPE and CPE Labs after 10.15.20

## 2019-01-03 ENCOUNTER — Other Ambulatory Visit: Payer: Self-pay | Admitting: Internal Medicine

## 2019-01-03 ENCOUNTER — Other Ambulatory Visit: Payer: 59 | Admitting: Internal Medicine

## 2019-01-04 ENCOUNTER — Other Ambulatory Visit: Payer: Self-pay

## 2019-01-04 ENCOUNTER — Other Ambulatory Visit: Payer: 59 | Admitting: Internal Medicine

## 2019-01-04 DIAGNOSIS — R200 Anesthesia of skin: Secondary | ICD-10-CM | POA: Diagnosis not present

## 2019-01-04 DIAGNOSIS — R202 Paresthesia of skin: Secondary | ICD-10-CM | POA: Diagnosis not present

## 2019-01-05 LAB — VITAMIN B12: Vitamin B-12: 392 pg/mL (ref 200–1100)

## 2019-01-05 LAB — FOLATE: Folate: 20.3 ng/mL

## 2019-02-08 ENCOUNTER — Other Ambulatory Visit: Payer: Self-pay

## 2019-02-08 ENCOUNTER — Ambulatory Visit: Payer: 59 | Admitting: Podiatry

## 2019-02-08 ENCOUNTER — Encounter: Payer: Self-pay | Admitting: Podiatry

## 2019-02-08 DIAGNOSIS — L603 Nail dystrophy: Secondary | ICD-10-CM

## 2019-02-08 NOTE — Progress Notes (Signed)
  Subjective:  Patient ID: Kristy Black, female    DOB: [birth year],  MRN: [id number] HPI Chief Complaint  Patient presents with  . Toe Pain    Hallux right - medial border, nail is thick and discolored, tender if nail is pressed, tried trimming, also 2nd toes bilateral are ingrown and sometimes tender  . New Patient (Initial Visit)    63 y.o. female presents with the above complaint.   ROS: Denies fever chills nausea vomiting muscle aches pains calf pain back pain chest pain shortness of breath.  Relates a history of peripheral vascular disease she sees Dr. Sherren Mocha Early.  Past Medical History:  Diagnosis Date  . Claudication of gluteal region ([place]) 01/18/2018  . 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 GERD (gastroesophageal reflux disease)   . Hyperlipidemia   . Hypertension    Past Surgical History:  Procedure Laterality Date  . BREAST SURGERY     l breast biopsy  . CARDIAC VALVE REPLACEMENT    . CESAREAN SECTION    . TUBAL LIGATION      Current Outpatient Medications:  .  Cholecalciferol (VITAMIN D3) 2000 units TABS, Take 1 tablet by mouth daily., Disp: , Rfl:  .  hydrochlorothiazide (HYDRODIURIL) 25 MG tablet, TAKE 1 TABLET BY MOUTH ONCE DAILY, Disp: 90 tablet, Rfl: 3 .  ibuprofen (ADVIL,MOTRIN) 200 MG tablet, Take 200 mg by mouth every 6 (six) hours as needed., Disp: , Rfl:  .  metoprolol succinate (TOPROL-XL) 50 MG 24 hr tablet, Take 1 tablet by mouth once daily, Disp: 90 tablet, Rfl: 3 .  rosuvastatin (CRESTOR) 10 MG tablet, Take 1 tablet by mouth once daily, Disp: 90 tablet, Rfl: 3  Allergies  Allergen Reactions  . Penicillins Hives   Review of Systems Objective:   Vitals:   02/08/19 0839  BP: 129/65  Pulse: 71  Resp: 16  Temp: (!) 97.3 F (36.3 C)    General: Well developed, nourished, in no acute distress, alert and oriented x3   Dermatological: Skin is warm, dry and supple bilateral. Nails x 10 are well maintained; remaining integument appears unremarkable  at this time. There are no open sores, no preulcerative lesions, no rash or signs of infection present.  Sharp incurvated nail margins thickening of the tibial nail margin hallux right exquisitely tender on palpation.  Vascular: Dorsalis Pedis artery and Posterior Tibial artery pedal pulses are 0/4 bilateral with immedate capillary fill time.  Slightly cyanotic toes.  Pedal hair growth present. No varicosities and no lower extremity edema present bilateral.   Neruologic: Grossly intact via light touch bilateral. Vibratory intact via tuning fork bilateral. Protective threshold with Semmes Wienstein monofilament intact to all pedal sites bilateral. Patellar and Achilles deep tendon reflexes 2+ bilateral. No Babinski or clonus noted bilateral.   Musculoskeletal: No gross boney pedal deformities bilateral. No pain, crepitus, or limitation noted with foot and ankle range of motion bilateral. Muscular strength 5/5 in all groups tested bilateral.  Gait: Unassisted, Nonantalgic.    Radiographs:  None taken  Assessment & Plan:   Assessment: Peripheral vascular disease secondary to smoking with painful ingrown possibly mycotic nail hallux right.  Plan: Samples of the nail and skin were taken today to be sent for pathologic evaluation we will follow-up with her in 1 month     Azizi Bally T. [place], [place]

## 2019-03-08 ENCOUNTER — Telehealth: Payer: Self-pay | Admitting: *Deleted

## 2019-03-08 NOTE — Telephone Encounter (Signed)
Left message informing pt Dr. Milinda Pointer had reviewed the results and would discuss at her 03/17/2019 8:15AM appt.

## 2019-03-08 NOTE — Telephone Encounter (Signed)
-----   Message from Garrel Ridgel, [place] sent at 02/23/2019  4:31 PM EDT ----- Positive for fungus

## 2019-03-17 ENCOUNTER — Other Ambulatory Visit: Payer: Self-pay

## 2019-03-17 ENCOUNTER — Ambulatory Visit: Payer: 59 | Admitting: Podiatry

## 2019-03-17 ENCOUNTER — Other Ambulatory Visit: Payer: Self-pay | Admitting: Vascular Surgery

## 2019-03-17 ENCOUNTER — Encounter: Payer: Self-pay | Admitting: Podiatry

## 2019-03-17 VITALS — Temp 97.8°F

## 2019-03-17 DIAGNOSIS — L603 Nail dystrophy: Secondary | ICD-10-CM

## 2019-03-17 MED ORDER — TERBINAFINE HCL 250 MG PO TABS: 250.0000 mg | ORAL_TABLET | Freq: Every day | ORAL | 0 refills | Status: DC

## 2019-03-17 NOTE — Patient Instructions (Signed)
Terbinafine tablets What is this medicine? TERBINAFINE (TER bin a feen) is an antifungal medicine. It is used to treat certain kinds of fungal or yeast infections. This medicine may be used for other purposes; ask your health care provider or pharmacist if you have questions. COMMON BRAND NAME(S): Lamisil, Terbinex What should I tell my health care provider before I take this medicine? They need to know if you have any of these conditions:  drink alcoholic beverages  kidney disease  liver disease  an unusual or allergic reaction to terbinafine, other medicines, foods, dyes, or preservatives  pregnant or trying to get pregnant  breast-feeding How should I use this medicine? Take this medicine by mouth with a full glass of water. Follow the directions on the prescription label. You can take this medicine with food or on an empty stomach. Take your medicine at regular intervals. Do not take your medicine more often than directed. Do not skip doses or stop your medicine early even if you feel better. Do not stop taking except on your doctor's advice. Talk to your pediatrician regarding the use of this medicine in children. Special care may be needed. Overdosage: If you think you have taken too much of this medicine contact a poison control center or emergency room at once. NOTE: This medicine is only for you. Do not share this medicine with others. What if I miss a dose? If you miss a dose, take it as soon as you can. If it is almost time for your next dose, take only that dose. Do not take double or extra doses. What may interact with this medicine? Do not take this medicine with any of the following medications:  thioridazine This medicine may also interact with the following medications:  beta-blockers  caffeine  cimetidine  cyclosporine  medicines for depression, anxiety, or psychotic disturbances  medicines for fungal infections like fluconazole and ketoconazole  medicines  for irregular heartbeat like amiodarone, flecainide and propafenone  rifampin  warfarin This list may not describe all possible interactions. Give your health care provider a list of all the medicines, herbs, non-prescription drugs, or dietary supplements you use. Also tell them if you smoke, drink alcohol, or use illegal drugs. Some items may interact with your medicine. What should I watch for while using this medicine? Visit your doctor or health care provider regularly. Tell your doctor right away if you have nausea or vomiting, loss of appetite, stomach pain on your right upper side, yellow skin, dark urine, light stools, or are over tired. Some fungal infections need many weeks or months of treatment to cure. If you are taking this medicine for a long time, you will need to have important blood work done. This medicine may cause serious skin reactions. They can happen weeks to months after starting the medicine. Contact your health care provider right away if you notice fevers or flu-like symptoms with a rash. The rash may be red or purple and then turn into blisters or peeling of the skin. Or, you might notice a red rash with swelling of the face, lips or lymph nodes in your neck or under your arms. What side effects may I notice from receiving this medicine? Side effects that you should report to your doctor or health care professional as soon as possible:  allergic reactions like skin rash or hives, swelling of the face, lips, or tongue  changes in vision  dark urine  fever or infection  general ill feeling or flu-like symptoms    light-colored stools  loss of appetite, nausea  rash, fever, and swollen lymph nodes  redness, blistering, peeling or loosening of the skin, including inside the mouth  right upper belly pain  unusually weak or tired  yellowing of the eyes or skin Side effects that usually do not require medical attention (report to your doctor or health care  professional if they continue or are bothersome):  changes in taste  diarrhea  hair loss  muscle or joint pain  stomach gas  stomach upset This list may not describe all possible side effects. Call your doctor for medical advice about side effects. You may report side effects to FDA at 1-800-FDA-1088. Where should I keep my medicine? Keep out of the reach of children. Store at room temperature below 25 degrees C (77 degrees F). Protect from light. Throw away any unused medicine after the expiration date. NOTE: This sheet is a summary. It may not cover all possible information. If you have questions about this medicine, talk to your doctor, pharmacist, or health care provider.  2020 Elsevier/Gold Standard (2018-11-26 15:37:07)  

## 2019-03-19 NOTE — Progress Notes (Signed)
She presents today for follow-up of her nail dystrophy.  She relates no changes.  Objective: Vital signs are stable she is alert and oriented x3.  Pulses are palpable.  No change on physical exam regarding the nail plates.  Pathology result does demonstrate subungual pattern of growth with rare fungal elements seen GMS stain demonstrates fungal elements and PCR test does demonstrate onychomycosis dermatophyte origin.  Assessment: Onychomycosis.  Plan: I have already reviewed recent blood work which demonstrates the liver and kidney function is normal.  At this point we will start her on Lamisil 250 mg tablets 1 p.o. daily x30 tablets I will follow-up with her in 1 month at which time another liver profile will be performed and we will dispense 90 tablets at that time.  She will call with questions or concerns.  We did discuss other alternatives such as topical therapy and laser therapy.  We did discuss the possible side effects of this medication she understands that and is amenable to it.

## 2019-04-28 ENCOUNTER — Ambulatory Visit: Payer: 59 | Admitting: Podiatry

## 2019-05-17 ENCOUNTER — Ambulatory Visit: Payer: 59 | Admitting: Podiatry

## 2019-05-24 ENCOUNTER — Ambulatory Visit: Payer: 59 | Admitting: Podiatry

## 2019-06-14 ENCOUNTER — Other Ambulatory Visit: Payer: Self-pay

## 2019-06-14 ENCOUNTER — Other Ambulatory Visit: Payer: 59 | Admitting: Internal Medicine

## 2019-06-14 DIAGNOSIS — G609 Hereditary and idiopathic neuropathy, unspecified: Secondary | ICD-10-CM

## 2019-06-14 DIAGNOSIS — E782 Mixed hyperlipidemia: Secondary | ICD-10-CM

## 2019-06-14 DIAGNOSIS — K5904 Chronic idiopathic constipation: Secondary | ICD-10-CM

## 2019-06-14 DIAGNOSIS — Z8659 Personal history of other mental and behavioral disorders: Secondary | ICD-10-CM

## 2019-06-14 DIAGNOSIS — K219 Gastro-esophageal reflux disease without esophagitis: Secondary | ICD-10-CM

## 2019-06-14 DIAGNOSIS — I10 Essential (primary) hypertension: Secondary | ICD-10-CM

## 2019-06-14 DIAGNOSIS — Z0000 Encounter for general adult medical examination without abnormal findings: Secondary | ICD-10-CM

## 2019-06-14 DIAGNOSIS — F17200 Nicotine dependence, unspecified, uncomplicated: Secondary | ICD-10-CM

## 2019-06-15 LAB — COMPLETE METABOLIC PANEL WITH GFR
AG Ratio: 1.6 (calc) (ref 1.0–2.5)
ALT: 17 U/L (ref 6–29)
AST: 18 U/L (ref 10–35)
Albumin: 4.2 g/dL (ref 3.6–5.1)
Alkaline phosphatase (APISO): 94 U/L (ref 37–153)
BUN: 10 mg/dL (ref 7–25)
CO2: 31 mmol/L (ref 20–32)
Calcium: 10 mg/dL (ref 8.6–10.4)
Chloride: 100 mmol/L (ref 98–110)
Creat: 0.69 mg/dL (ref 0.50–0.99)
GFR, Est African American: 110 mL/min/{1.73_m2} (ref >=60)
GFR, Est Non African American: 95 mL/min/{1.73_m2} (ref >=60)
Globulin: 2.7 g/dL (calc) (ref 1.9–3.7)
Glucose, Bld: 100 mg/dL — ABNORMAL HIGH (ref 65–99)
Potassium: 4 mmol/L (ref 3.5–5.3)
Sodium: 137 mmol/L (ref 135–146)
Total Bilirubin: 0.5 mg/dL (ref 0.2–1.2)
Total Protein: 6.9 g/dL (ref 6.1–8.1)

## 2019-06-15 LAB — CBC WITH DIFFERENTIAL/PLATELET
Absolute Monocytes: 896 cells/uL (ref 200–950)
Basophils Absolute: 64 cells/uL (ref 0–200)
Basophils Relative: 0.5 %
Eosinophils Absolute: 166 cells/uL (ref 15–500)
Eosinophils Relative: 1.3 %
HCT: 47.7 % — ABNORMAL HIGH (ref 35.0–45.0)
Hemoglobin: 15.9 g/dL — ABNORMAL HIGH (ref 11.7–15.5)
Lymphs Abs: 3802 cells/uL (ref 850–3900)
MCH: 28.6 pg (ref 27.0–33.0)
MCHC: 33.3 g/dL (ref 32.0–36.0)
MCV: 85.8 fL (ref 80.0–100.0)
MPV: 10 fL (ref 7.5–12.5)
Monocytes Relative: 7 %
Neutro Abs: 7872 cells/uL — ABNORMAL HIGH (ref 1500–7800)
Neutrophils Relative %: 61.5 %
Platelets: 312 10*3/uL (ref 140–400)
RBC: 5.56 10*6/uL — ABNORMAL HIGH (ref 3.80–5.10)
RDW: 13 % (ref 11.0–15.0)
Total Lymphocyte: 29.7 %
WBC: 12.8 10*3/uL — ABNORMAL HIGH (ref 3.8–10.8)

## 2019-06-15 LAB — VITAMIN D 25 HYDROXY (VIT D DEFICIENCY, FRACTURES): Vit D, 25-Hydroxy: 48 ng/mL (ref 30–100)

## 2019-06-15 LAB — LIPID PANEL
Cholesterol: 126 mg/dL (ref <200)
HDL: 33 mg/dL — ABNORMAL LOW (ref >=50)
LDL Cholesterol (Calc): 64 mg/dL (calc)
Non-HDL Cholesterol (Calc): 93 mg/dL (calc) (ref <130)
Total CHOL/HDL Ratio: 3.8 (calc) (ref <5.0)
Triglycerides: 218 mg/dL — ABNORMAL HIGH (ref <150)

## 2019-06-15 LAB — TSH: TSH: 2.08 mIU/L (ref 0.40–4.50)

## 2019-06-17 ENCOUNTER — Ambulatory Visit: Payer: 59 | Admitting: Internal Medicine

## 2019-06-17 ENCOUNTER — Encounter: Payer: Self-pay | Admitting: Internal Medicine

## 2019-06-17 ENCOUNTER — Other Ambulatory Visit: Payer: Self-pay

## 2019-06-17 VITALS — BP 130/70 | HR 78 | Temp 98.0°F | Ht 67.0 in | Wt 171.0 lb

## 2019-06-17 DIAGNOSIS — E782 Mixed hyperlipidemia: Secondary | ICD-10-CM

## 2019-06-17 DIAGNOSIS — I739 Peripheral vascular disease, unspecified: Secondary | ICD-10-CM

## 2019-06-17 DIAGNOSIS — Z0000 Encounter for general adult medical examination without abnormal findings: Secondary | ICD-10-CM

## 2019-06-17 DIAGNOSIS — D72829 Elevated white blood cell count, unspecified: Secondary | ICD-10-CM

## 2019-06-17 DIAGNOSIS — F17200 Nicotine dependence, unspecified, uncomplicated: Secondary | ICD-10-CM | POA: Diagnosis not present

## 2019-06-17 DIAGNOSIS — I10 Essential (primary) hypertension: Secondary | ICD-10-CM | POA: Diagnosis not present

## 2019-06-17 DIAGNOSIS — K5904 Chronic idiopathic constipation: Secondary | ICD-10-CM

## 2019-06-17 DIAGNOSIS — R8290 Unspecified abnormal findings in urine: Secondary | ICD-10-CM | POA: Diagnosis not present

## 2019-06-17 DIAGNOSIS — M7918 Myalgia, other site: Secondary | ICD-10-CM

## 2019-06-17 DIAGNOSIS — N6019 Diffuse cystic mastopathy of unspecified breast: Secondary | ICD-10-CM

## 2019-06-17 DIAGNOSIS — Z8659 Personal history of other mental and behavioral disorders: Secondary | ICD-10-CM | POA: Diagnosis not present

## 2019-06-17 LAB — POCT URINALYSIS DIPSTICK
Appearance: NEGATIVE
Bilirubin, UA: NEGATIVE
Glucose, UA: NEGATIVE
Ketones, UA: NEGATIVE
Nitrite, UA: NEGATIVE
Odor: NEGATIVE
Protein, UA: NEGATIVE
Spec Grav, UA: 1.01 (ref 1.010–1.025)
Urobilinogen, UA: 0.2 E.U./dL
pH, UA: 6 (ref 5.0–8.0)

## 2019-06-17 MED ORDER — ROSUVASTATIN CALCIUM 20 MG PO TABS: 20.0000 mg | ORAL_TABLET | Freq: Every day | ORAL | 3 refills | Status: DC

## 2019-06-17 MED ORDER — CLINDAMYCIN HCL 300 MG PO CAPS: 300.0000 mg | ORAL_CAPSULE | Freq: Three times a day (TID) | ORAL | 0 refills | Status: DC

## 2019-06-17 NOTE — Progress Notes (Signed)
Subjective:    Patient ID: Kristy Black, female    DOB: [birth year], 63 y.o.   MRN: [id number]  HPI  63 year old Female for prolonged health maintenance exam and evaluation of medical issues.  History of smoking and hypertension.  Longstanding history of constipation treated with MiraLAX.  History of anxiety.  History of hyperlipidemia.  History of GE reflux.  Continues to smoke and is smoked for well over 20 years.  Smoking cessation has been discussed in the past and patient has not expressed interest in this but recently has cut back considerably.  Left breast fibroadenoma removed in 1995.  Right breast fibroadenoma removed in 2000.  C-section 1991.  Bilateral tubal ligation 1995.  Benign left breast biopsy 2007.  Tetanus immunization given at health department in  Fall of 2012.  Social history: She is retired from the health department.  She is married.  Has 1 adult daughter.  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  She has 1 granddaughter.  Family history: Father with history of diabetes, coronary artery disease, hypertension died at age 73 of a sudden MI.  Mother passed away at age 51 with history of dementia, hypertension, congestive heart failure.  She had a pacemaker.  Patient has 2 brothers.  1 sister died at age 41 of promyelocytic leukemia.  1 sister living in good health.  Brothers have glucose intolerance.  She has considerable issues with right hip and buttock pain.  Has had a negative MRI of the LS spine and negative hip films.  She has been followed by Dr. Donnetta Hutching for peripheral vascular disease.  Have discussed at length once again.  Had MRI of the LS spine in 2018 that did not reveal any spinal stenosis or neural impingement or disc herniation.  Has hyperlipidemia treated with statin medication.       Review of Systems  Constitutional: Negative.   Eyes: Negative.   Respiratory: Negative.   Cardiovascular: Negative.  Negative for chest  pain and palpitations.  Gastrointestinal:       History of constipation  Genitourinary: Negative for difficulty urinating and dysuria.  Neurological: Negative.   Psychiatric/Behavioral:       History of anxiety   see above-Main complaint is left buttock pain which bothers her at night     Objective:   Physical Exam Vitals signs reviewed.  Constitutional:      General: She is not in acute distress.    Appearance: Normal appearance.  HENT:     Head: Normocephalic.     Right Ear: Tympanic membrane normal.     Left Ear: Tympanic membrane normal.     Nose: Nose normal.  Eyes:     General: No scleral icterus.    Extraocular Movements: Extraocular movements intact.     Conjunctiva/sclera: Conjunctivae normal.     Pupils: Pupils are equal, round, and reactive to light.  Neck:     Musculoskeletal: Neck supple. No neck rigidity.     Vascular: No carotid bruit.     Comments: No thyromegaly Cardiovascular:     Rate and Rhythm: Normal rate and regular rhythm.     Heart sounds: Normal heart sounds. No murmur.  Pulmonary:     Effort: Pulmonary effort is normal. No respiratory distress.     Breath sounds: Normal breath sounds. No wheezing.     Comments: Breast without masses Abdominal:     Palpations: Abdomen is soft. There is no mass.     Tenderness:  There is no abdominal tenderness. There is no guarding or rebound.  Genitourinary:    Comments: Pap taken in 2018.  Bimanual normal. Musculoskeletal:        General: No deformity.     Right lower leg: No edema.     Left lower leg: No edema.  Skin:    General: Skin is dry.  Neurological:     General: No focal deficit present.     Mental Status: She is alert and oriented to person, place, and time.     Comments: Straight leg raising is negative at 90 degrees   Psychiatric:        Mood and Affect: Mood normal.        Thought Content: Thought content normal.        Judgment: Judgment normal.           Assessment & Plan:   Persistent right hip and buttock pain that is particularly bothersome at night.  Followed by Dr. Donnetta Hutching for minimal claudication.  Not sure what causes his chronic hip pain.  Has had a negative MRI of the LS spine.  It aggravates her at night and interferes with sleep.  She cannot get any relief.  Have considered gabapentin but she does not want to take that.  Recommend follow-up with Dr. Donnetta Hutching.  History of smoking but has cut back considerably-not ready to quit  History of anxiety -not on medication  History of vitamin D deficiency-level is stable at 48  Essential hypertension-stable on HCTZ and metoprolol  Hyperlipidemia treated with Crestor 20 mg daily.  Triglycerides are 218 and previously were 236 in April but a year ago were 154.  Total cholesterol was normal at 126 with an LDL of 64.  Continue diet efforts.  Exercise causes exacerbation of her buttock pain.  GE reflux-currently not taking PPI  Functional constipation-not on prescription medication  History of fibrocystic breast disease  Abnormal urine dipstick-culture sent but revealed mixed flora  Elevated white blood cell count-for some reason her white blood cell count was 12,400 but it could be related to a dental infection that she has had.  It was also elevated in 2019 at 12,100.  On October 13 it was 12,800.  I am giving her clindamycin 300 mg 3 times a day for 10 days in case dental infection is causing elevated white blood cell count.  She will have repeat CBC October 26 with pathology review of smear  Addendum: Repeat white blood cell count October 26 12,400-basically no change with treatment with clindamycin.  Pathologist review of smear however shows absolute lymphocytes and favors reactive process.  Plan: Suggest we repeat CBC January 2021.  If white blood cell count remains elevated, consider Hematology referral.  She has 52-month recheck is scheduled for April 2021.

## 2019-06-18 LAB — URINE CULTURE
MICRO NUMBER:: 998452
SPECIMEN QUALITY:: ADEQUATE

## 2019-06-24 ENCOUNTER — Other Ambulatory Visit: Payer: 59 | Admitting: Internal Medicine

## 2019-06-27 ENCOUNTER — Other Ambulatory Visit: Payer: Self-pay

## 2019-06-27 ENCOUNTER — Other Ambulatory Visit: Payer: 59 | Admitting: Internal Medicine

## 2019-06-27 DIAGNOSIS — D72829 Elevated white blood cell count, unspecified: Secondary | ICD-10-CM

## 2019-06-28 ENCOUNTER — Other Ambulatory Visit: Payer: Self-pay

## 2019-06-28 DIAGNOSIS — D72829 Elevated white blood cell count, unspecified: Secondary | ICD-10-CM

## 2019-06-29 LAB — CBC WITH DIFFERENTIAL/PLATELET
Absolute Monocytes: 806 {cells}/uL (ref 200–950)
Basophils Absolute: 62 {cells}/uL (ref 0–200)
Basophils Relative: 0.5 %
Eosinophils Absolute: 112 {cells}/uL (ref 15–500)
Eosinophils Relative: 0.9 %
HCT: 44.7 % (ref 35.0–45.0)
Hemoglobin: 15.1 g/dL (ref 11.7–15.5)
Lymphs Abs: 4452 {cells}/uL — ABNORMAL HIGH (ref 850–3900)
MCH: 28.8 pg (ref 27.0–33.0)
MCHC: 33.8 g/dL (ref 32.0–36.0)
MCV: 85.1 fL (ref 80.0–100.0)
MPV: 10 fL (ref 7.5–12.5)
Monocytes Relative: 6.5 %
Neutro Abs: 6969 {cells}/uL (ref 1500–7800)
Neutrophils Relative %: 56.2 %
Platelets: 311 Thousand/uL (ref 140–400)
RBC: 5.25 Million/uL — ABNORMAL HIGH (ref 3.80–5.10)
RDW: 12.7 % (ref 11.0–15.0)
Total Lymphocyte: 35.9 %
WBC: 12.4 Thousand/uL — ABNORMAL HIGH (ref 3.8–10.8)

## 2019-06-29 LAB — TEST AUTHORIZATION

## 2019-06-29 LAB — PATHOLOGIST SMEAR REVIEW

## 2019-07-15 ENCOUNTER — Other Ambulatory Visit: Payer: Self-pay | Admitting: Internal Medicine

## 2019-07-30 NOTE — Patient Instructions (Signed)
Repeating CBC with BX with peripheral review of smear late October with further instructions to follow.  Continue Crestor and antihypertensive hypertensive meds.  Have annual mammogram.  Continue vitamin D supplement.  Consider smoking cessation.

## 2019-08-03 ENCOUNTER — Telehealth: Payer: Self-pay

## 2019-08-03 NOTE — Telephone Encounter (Signed)
Needs to come back in January for LABS (CBC) then OV with Dr. Renold Genta after we get results.

## 2019-11-08 LAB — HM MAMMOGRAPHY

## 2019-11-10 ENCOUNTER — Encounter: Payer: Self-pay | Admitting: Internal Medicine

## 2019-11-10 ENCOUNTER — Ambulatory Visit: Payer: 59 | Attending: Internal Medicine

## 2019-11-10 DIAGNOSIS — Z23 Encounter for immunization: Secondary | ICD-10-CM

## 2019-11-10 NOTE — Progress Notes (Signed)
   Covid-19 Vaccination Clinic  Name:  Kristy Black    MRN: [id number] DOB: 1956/02/04  11/10/2019  [name] was observed post Covid-19 immunization for 30 minutes based on pre-vaccination screening without incident. She was provided with Vaccine Information Sheet and instruction to access the V-Safe system.   [name] was instructed to call 911 with any severe reactions post vaccine: Marland Kitchen Difficulty breathing  . Swelling of face and throat  . A fast heartbeat  . A bad rash all over body  . Dizziness and weakness   Immunizations Administered    Name Date Dose VIS Date Route   Pfizer COVID-19 Vaccine 11/10/2019  3:10 PM 0.3 mL 08/12/2019 Intramuscular   Manufacturer: [place]   Lot: [id number]   [employer]: [id number]

## 2019-12-01 ENCOUNTER — Other Ambulatory Visit: Payer: Self-pay | Admitting: *Deleted

## 2019-12-01 DIAGNOSIS — I70213 Atherosclerosis of native arteries of extremities with intermittent claudication, bilateral legs: Secondary | ICD-10-CM

## 2019-12-05 ENCOUNTER — Ambulatory Visit: Payer: 59

## 2019-12-06 ENCOUNTER — Ambulatory Visit (HOSPITAL_COMMUNITY)
Admission: RE | Admit: 2019-12-06 | Discharge: 2019-12-06 | Disposition: A | Discharge status: Home / Self Care | Payer: 59 | Source: Ambulatory Visit | Attending: Surgery | Admitting: Surgery

## 2019-12-06 ENCOUNTER — Encounter: Payer: Self-pay | Admitting: Vascular Surgery

## 2019-12-06 ENCOUNTER — Ambulatory Visit: Payer: 59 | Admitting: Vascular Surgery

## 2019-12-06 ENCOUNTER — Other Ambulatory Visit: Payer: Self-pay

## 2019-12-06 VITALS — BP 154/71 | HR 67 | Temp 97.6°F | Resp 20 | Ht 67.0 in | Wt 172.0 lb

## 2019-12-06 DIAGNOSIS — I70213 Atherosclerosis of native arteries of extremities with intermittent claudication, bilateral legs: Secondary | ICD-10-CM

## 2019-12-06 NOTE — Progress Notes (Signed)
[facility] [place]  Patient name: LENNIS DYNES MRN: [id number] DOB: [birth year] Sex: female  REASON FOR VISIT: Continued follow-up of lower extremity claudication  HPI: Kristy Black is a 64 y.o. female here today for follow-up.  She reports that she continues to have buttock claudication.  This does limit her to some degree but she is able to do her usual activities.  She has had no progression to any lower extremity tissue loss.  She has no other major health issues.  She does continue to smoke half pack cigarettes per day.  Past Medical History:  Diagnosis Date  . Claudication of gluteal region ([place]) 01/18/2018  . Elevated homocysteine   . GERD (gastroesophageal reflux disease)   . Hyperlipidemia   . Hypertension     Family History  Problem Relation Age of Onset  . Cancer Sister     SOCIAL HISTORY: Social History   Tobacco Use  . Smoking status: Current Every Day Smoker    Packs/day: 0.50    Types: Cigarettes  . Smokeless tobacco: Never Used  . Tobacco comment: 10 cigarettes/day  Substance Use Topics  . Alcohol use: Yes    Comment: socially    Allergies  Allergen Reactions  . Penicillins Hives    Current Outpatient Medications  Medication Sig Dispense Refill  . Cholecalciferol (VITAMIN D3) 2000 units TABS Take 1 tablet by mouth daily.    . hydrochlorothiazide (HYDRODIURIL) 25 MG tablet Take 1 tablet by mouth once daily 90 tablet 2  . ibuprofen (ADVIL,MOTRIN) 200 MG tablet Take 200 mg by mouth every 6 (six) hours as needed.    . metoprolol succinate (TOPROL-XL) 50 MG 24 hr tablet Take 1 tablet by mouth once daily 90 tablet 2  . rosuvastatin (CRESTOR) 20 MG tablet Take 1 tablet (20 mg total) by mouth daily. 90 tablet 3   No current facility-administered medications for this visit.    REVIEW OF SYSTEMS:  [X]  denotes positive finding, [ ]  denotes negative finding Cardiac  Comments:  Chest pain or  chest pressure:    Shortness of breath upon exertion:    Short of breath when lying flat:    Irregular heart rhythm:        Vascular    Pain in calf, thigh, or hip brought on by ambulation: x   Pain in feet at night that wakes you up from your sleep:     Blood clot in your veins:    Leg swelling:           PHYSICAL EXAM: Vitals:   12/06/19 0930  BP: (!) 154/71  Pulse: 67  Resp: 20  Temp: 97.6 F (36.4 C)  SpO2: 99%  Weight: 172 lb (78 kg)  Height: 5\' 7"  (1.702 m)    GENERAL: The patient is a well-nourished female, in no acute distress. The vital signs are documented above. CARDIOVASCULAR: Do not palpate femoral or distal pulses PULMONARY: There is good air exchange  MUSCULOSKELETAL: There are no major deformities or cyanosis. NEUROLOGIC: No focal weakness or paresthesias are detected. SKIN: There are no ulcers or rashes noted. PSYCHIATRIC: The patient has a normal affect.  DATA:  Noninvasive studies today reveal ankle arm index of point 4 3 bilaterally.  This is slightly down from her study 1 year ago  MEDICAL ISSUES: I had a very long discussion with the patient regarding the significance of her intermittent claudication.  She does not have any symptoms of critical limb ischemia  and has not had any tissue loss.  I do feel that she would be high likelihood that she could have endovascular treatment for improvement in her symptoms.  I did explain that she is not limb threatening from this and if she is comfortable with her current level of claudication that it is perfectly safe to continue observation only.  She wishes to continue her walking program.  Is not particular interested in smoking cessation.  I have suggested that on her next visit with her she obtain a CT scan to further determine what would be required for correction of her claudication.  We will schedule her for CT angiogram of abdomen pelvis and runoff and follow-up in 6 months    Rosetta Posner, MD  [facility] Tel [phone] Pager [phone]

## 2019-12-13 ENCOUNTER — Ambulatory Visit: Payer: 59 | Attending: Internal Medicine

## 2019-12-13 DIAGNOSIS — Z23 Encounter for immunization: Secondary | ICD-10-CM

## 2019-12-13 NOTE — Progress Notes (Signed)
   Covid-19 Vaccination Clinic  Name:  Kristy Black    MRN: [id number] DOB: 12-14-1955  12/13/2019  [name] was observed post Covid-19 immunization for 15 minutes without incident. She was provided with Vaccine Information Sheet and instruction to access the V-Safe system.   [name] was instructed to call 911 with any severe reactions post vaccine: Marland Kitchen Difficulty breathing  . Swelling of face and throat  . A fast heartbeat  . A bad rash all over body  . Dizziness and weakness   Immunizations Administered    Name Date Dose VIS Date Route   Pfizer COVID-19 Vaccine 12/13/2019  3:31 PM 0.3 mL 08/12/2019 Intramuscular   Manufacturer: [place]   Lot: [id number]   [place]: [id number]

## 2019-12-20 ENCOUNTER — Other Ambulatory Visit: Payer: Self-pay

## 2019-12-20 ENCOUNTER — Other Ambulatory Visit: Payer: 59 | Admitting: Internal Medicine

## 2019-12-20 DIAGNOSIS — E782 Mixed hyperlipidemia: Secondary | ICD-10-CM

## 2019-12-20 LAB — HEPATIC FUNCTION PANEL
AG Ratio: 1.6 (calc) (ref 1.0–2.5)
ALT: 17 U/L (ref 6–29)
AST: 18 U/L (ref 10–35)
Albumin: 4.1 g/dL (ref 3.6–5.1)
Alkaline phosphatase (APISO): 89 U/L (ref 37–153)
Bilirubin, Direct: 0.1 mg/dL (ref 0.0–0.2)
Globulin: 2.5 g/dL (calc) (ref 1.9–3.7)
Indirect Bilirubin: 0.3 mg/dL (calc) (ref 0.2–1.2)
Total Bilirubin: 0.4 mg/dL (ref 0.2–1.2)
Total Protein: 6.6 g/dL (ref 6.1–8.1)

## 2019-12-20 LAB — LIPID PANEL
Cholesterol: 116 mg/dL (ref <200)
HDL: 31 mg/dL — ABNORMAL LOW (ref >=50)
LDL Cholesterol (Calc): 63 mg/dL (calc)
Non-HDL Cholesterol (Calc): 85 mg/dL (calc) (ref <130)
Total CHOL/HDL Ratio: 3.7 (calc) (ref <5.0)
Triglycerides: 134 mg/dL (ref <150)

## 2019-12-22 ENCOUNTER — Other Ambulatory Visit: Payer: Self-pay

## 2019-12-22 ENCOUNTER — Ambulatory Visit: Payer: 59 | Admitting: Internal Medicine

## 2019-12-22 ENCOUNTER — Encounter: Payer: Self-pay | Admitting: Internal Medicine

## 2019-12-22 VITALS — BP 130/70 | HR 78 | Temp 98.0°F | Ht 67.0 in | Wt 172.0 lb

## 2019-12-22 DIAGNOSIS — F17200 Nicotine dependence, unspecified, uncomplicated: Secondary | ICD-10-CM | POA: Diagnosis not present

## 2019-12-22 DIAGNOSIS — I739 Peripheral vascular disease, unspecified: Secondary | ICD-10-CM

## 2019-12-22 DIAGNOSIS — E786 Lipoprotein deficiency: Secondary | ICD-10-CM

## 2019-12-22 DIAGNOSIS — I10 Essential (primary) hypertension: Secondary | ICD-10-CM | POA: Diagnosis not present

## 2019-12-22 DIAGNOSIS — Z0000 Encounter for general adult medical examination without abnormal findings: Secondary | ICD-10-CM | POA: Diagnosis not present

## 2019-12-22 DIAGNOSIS — E782 Mixed hyperlipidemia: Secondary | ICD-10-CM

## 2019-12-22 DIAGNOSIS — M7918 Myalgia, other site: Secondary | ICD-10-CM

## 2019-12-22 LAB — POCT URINALYSIS DIPSTICK
Appearance: NEGATIVE
Bilirubin, UA: NEGATIVE
Blood, UA: NEGATIVE
Glucose, UA: NEGATIVE
Ketones, UA: NEGATIVE
Leukocytes, UA: NEGATIVE
Nitrite, UA: NEGATIVE
Odor: NEGATIVE
Protein, UA: NEGATIVE
Spec Grav, UA: 1.01 (ref 1.010–1.025)
Urobilinogen, UA: 0.2 E.U./dL
pH, UA: 6.5 (ref 5.0–8.0)

## 2019-12-22 NOTE — Progress Notes (Signed)
   Subjective:    Patient ID: Kristy Black, female    DOB: 1956/04/13, 64 y.o.   MRN: [id number]  HPI 64 year old Female for 45-month recheck.  She has a history of smoking, complicated symptoms, hypertension, GE reflux, anxiety and hyperlipidemia.  She saw Dr. Donnetta Hutching April 6 for follow-up on claudication.  It is noted she continues to smoke half pack of cigarettes daily.  Discussion was held with her regarding continuing to live with symptoms for having endovascular treatment to improve symptoms.  Patient decided to continue her walking program.  Has not been interested in smoking cessation.  A CT scan was suggested at next visit for further evaluation of claudication.  This will be done in 6 months and will be a CT angiogram of abdomen and pelvis and runoff.  She has a low HDL of 31.  Total cholesterol was 116, triglycerides 134 and LDL cholesterol 63.  Lipid panel was normal.  Remains on HCTZ, metoprolol and rosuvastatin.  Review of Systems see above     Objective:   Physical Exam Blood pressure is 130/70 pulse 78 temperature 98 degrees orally pulse oximetry 98% weight 172 pounds BMI 26.94 neck is supple without JVD thyromegaly or carotid bruits.  Chest clear to auscultation without rales or wheezing.  Cardiac exam regular rate and rhythm normal S1 and S2.  Lower extremities without pitting edema.       Assessment & Plan:  Claudication affecting buttock-being seen by Dr. Donnetta Hutching and is to have follow-up CT angiogram in 6 months  Hyperlipidemia-stable on statin medication  Low HDL  Essential hypertension stable on current medication  History of smoking-not willing to quit  Plan: Continue current medications.  Patient will be due for health maintenance exam October 2021.

## 2019-12-30 NOTE — Patient Instructions (Signed)
Continue current medications and follow-up in 6 months.  It was a pleasure to see you today.  Please consider stopping smoking.

## 2020-04-11 ENCOUNTER — Other Ambulatory Visit: Payer: Self-pay | Admitting: Internal Medicine

## 2020-06-08 ENCOUNTER — Other Ambulatory Visit: Payer: Self-pay

## 2020-06-08 DIAGNOSIS — I70213 Atherosclerosis of native arteries of extremities with intermittent claudication, bilateral legs: Secondary | ICD-10-CM

## 2020-06-14 ENCOUNTER — Other Ambulatory Visit: Payer: 59 | Admitting: Internal Medicine

## 2020-06-14 ENCOUNTER — Other Ambulatory Visit: Payer: Self-pay

## 2020-06-14 DIAGNOSIS — I10 Essential (primary) hypertension: Secondary | ICD-10-CM

## 2020-06-14 DIAGNOSIS — K219 Gastro-esophageal reflux disease without esophagitis: Secondary | ICD-10-CM

## 2020-06-14 DIAGNOSIS — E782 Mixed hyperlipidemia: Secondary | ICD-10-CM

## 2020-06-14 DIAGNOSIS — G609 Hereditary and idiopathic neuropathy, unspecified: Secondary | ICD-10-CM

## 2020-06-14 DIAGNOSIS — Z0000 Encounter for general adult medical examination without abnormal findings: Secondary | ICD-10-CM

## 2020-06-14 DIAGNOSIS — F17200 Nicotine dependence, unspecified, uncomplicated: Secondary | ICD-10-CM

## 2020-06-15 LAB — LIPID PANEL
Cholesterol: 124 mg/dL (ref <200)
HDL: 34 mg/dL — ABNORMAL LOW (ref >=50)
LDL Cholesterol (Calc): 63 mg/dL (calc)
Non-HDL Cholesterol (Calc): 90 mg/dL (calc) (ref <130)
Total CHOL/HDL Ratio: 3.6 (calc) (ref <5.0)
Triglycerides: 195 mg/dL — ABNORMAL HIGH (ref <150)

## 2020-06-15 LAB — CBC WITH DIFFERENTIAL/PLATELET
Absolute Monocytes: 823 cells/uL (ref 200–950)
Basophils Absolute: 73 cells/uL (ref 0–200)
Basophils Relative: 0.6 %
Eosinophils Absolute: 85 cells/uL (ref 15–500)
Eosinophils Relative: 0.7 %
HCT: 47.2 % — ABNORMAL HIGH (ref 35.0–45.0)
Hemoglobin: 16.1 g/dL — ABNORMAL HIGH (ref 11.7–15.5)
Lymphs Abs: 3775 cells/uL (ref 850–3900)
MCH: 29.5 pg (ref 27.0–33.0)
MCHC: 34.1 g/dL (ref 32.0–36.0)
MCV: 86.6 fL (ref 80.0–100.0)
MPV: 9.8 fL (ref 7.5–12.5)
Monocytes Relative: 6.8 %
Neutro Abs: 7345 cells/uL (ref 1500–7800)
Neutrophils Relative %: 60.7 %
Platelets: 330 10*3/uL (ref 140–400)
RBC: 5.45 10*6/uL — ABNORMAL HIGH (ref 3.80–5.10)
RDW: 13.4 % (ref 11.0–15.0)
Total Lymphocyte: 31.2 %
WBC: 12.1 10*3/uL — ABNORMAL HIGH (ref 3.8–10.8)

## 2020-06-15 LAB — COMPLETE METABOLIC PANEL WITH GFR
AG Ratio: 1.7 (calc) (ref 1.0–2.5)
ALT: 29 U/L (ref 6–29)
AST: 25 U/L (ref 10–35)
Albumin: 4.3 g/dL (ref 3.6–5.1)
Alkaline phosphatase (APISO): 95 U/L (ref 37–153)
BUN/Creatinine Ratio: 8 (calc) (ref 6–22)
BUN: 5 mg/dL — ABNORMAL LOW (ref 7–25)
CO2: 30 mmol/L (ref 20–32)
Calcium: 9.7 mg/dL (ref 8.6–10.4)
Chloride: 98 mmol/L (ref 98–110)
Creat: 0.66 mg/dL (ref 0.50–0.99)
GFR, Est African American: 110 mL/min/{1.73_m2} (ref >=60)
GFR, Est Non African American: 95 mL/min/{1.73_m2} (ref >=60)
Globulin: 2.5 g/dL (calc) (ref 1.9–3.7)
Glucose, Bld: 100 mg/dL — ABNORMAL HIGH (ref 65–99)
Potassium: 3.8 mmol/L (ref 3.5–5.3)
Sodium: 138 mmol/L (ref 135–146)
Total Bilirubin: 0.6 mg/dL (ref 0.2–1.2)
Total Protein: 6.8 g/dL (ref 6.1–8.1)

## 2020-06-15 LAB — VITAMIN D 25 HYDROXY (VIT D DEFICIENCY, FRACTURES): Vit D, 25-Hydroxy: 55 ng/mL (ref 30–100)

## 2020-06-15 LAB — TSH: TSH: 1.9 mIU/L (ref 0.40–4.50)

## 2020-06-18 ENCOUNTER — Other Ambulatory Visit: Payer: Self-pay

## 2020-06-18 ENCOUNTER — Encounter: Payer: Self-pay | Admitting: Internal Medicine

## 2020-06-18 ENCOUNTER — Other Ambulatory Visit (HOSPITAL_COMMUNITY)
Admission: RE | Admit: 2020-06-18 | Discharge: 2020-06-18 | Disposition: A | Discharge status: Home / Self Care | Payer: 59 | Source: Ambulatory Visit | Attending: Internal Medicine | Admitting: Internal Medicine

## 2020-06-18 ENCOUNTER — Ambulatory Visit: Payer: 59 | Admitting: Internal Medicine

## 2020-06-18 VITALS — BP 130/82 | HR 72 | Ht 67.0 in | Wt 167.0 lb

## 2020-06-18 DIAGNOSIS — Z124 Encounter for screening for malignant neoplasm of cervix: Secondary | ICD-10-CM | POA: Diagnosis not present

## 2020-06-18 DIAGNOSIS — I739 Peripheral vascular disease, unspecified: Secondary | ICD-10-CM

## 2020-06-18 DIAGNOSIS — F17200 Nicotine dependence, unspecified, uncomplicated: Secondary | ICD-10-CM

## 2020-06-18 DIAGNOSIS — E782 Mixed hyperlipidemia: Secondary | ICD-10-CM

## 2020-06-18 DIAGNOSIS — M25551 Pain in right hip: Secondary | ICD-10-CM

## 2020-06-18 DIAGNOSIS — M7918 Myalgia, other site: Secondary | ICD-10-CM | POA: Diagnosis not present

## 2020-06-18 DIAGNOSIS — R87610 Atypical squamous cells of undetermined significance on cytologic smear of cervix (ASC-US): Secondary | ICD-10-CM

## 2020-06-18 DIAGNOSIS — E786 Lipoprotein deficiency: Secondary | ICD-10-CM

## 2020-06-18 DIAGNOSIS — K5904 Chronic idiopathic constipation: Secondary | ICD-10-CM

## 2020-06-18 DIAGNOSIS — Z0000 Encounter for general adult medical examination without abnormal findings: Secondary | ICD-10-CM | POA: Diagnosis not present

## 2020-06-18 DIAGNOSIS — Z1211 Encounter for screening for malignant neoplasm of colon: Secondary | ICD-10-CM | POA: Diagnosis not present

## 2020-06-18 DIAGNOSIS — I10 Essential (primary) hypertension: Secondary | ICD-10-CM

## 2020-06-18 LAB — POCT URINALYSIS DIPSTICK
Appearance: NEGATIVE
Bilirubin, UA: NEGATIVE
Blood, UA: NEGATIVE
Glucose, UA: NEGATIVE
Ketones, UA: NEGATIVE
Leukocytes, UA: NEGATIVE
Nitrite, UA: NEGATIVE
Odor: NEGATIVE
Protein, UA: NEGATIVE
Spec Grav, UA: 1.01 (ref 1.010–1.025)
Urobilinogen, UA: 0.2 E.U./dL
pH, UA: 6.5 (ref 5.0–8.0)

## 2020-06-18 NOTE — Progress Notes (Signed)
   Subjective:    Patient ID: Kristy Black, female    DOB: 1956-05-11, 64 y.o.   MRN: [id number]  HPI 64 year old Female seen for Annual health maintenance exam and evaluation of medical issues.  She has a history of smoking, hypertension, constipation treated with MiraLAX, anxiety, hyperlipidemia, GE reflux.  She has smoked for well over 20 years. Smoking cessation has been discussed in the past. Patient has cut back considerably.  Left breast fibroadenoma removed in 1995. Right breast fibroadenoma removed in 2000. C-section 1991. Bilateral tubal ligation 1995. Benign left breast biopsy 2007.  Had tetanus immunization given at health department in the Fall 2012. Has had two COVID-19 vaccines.  She has considerable issues with right hip and buttock pain. Has had a negative MRI of the LS spine and negative hip films. Has been followed by Dr. Donnetta Hutching for peripheral vascular disease. Had MRI of the LS spine in twenty eighteen that did not reveal any spinal stenosis, neural impingement or even disc herniation. This pain bothers her every night and interferes with her sleep. She will be seeing Dr. Monica Martinez, vascular surgeon for evaluation of this issue soon.  Her triglycerides are significantly elevated at 195 despite being on Crestor. She has a low HDL of thirty-four.  Hypertension treated with metoprolol and HCTZ.  Family history: Mother died with complications of dementia but had history of coronary artery disease. Father died with an MI. One brother with history of diabetes. Another brother with history of hyperlipidemia, impaired glucose tolerance and kidney stones. One sister with hypertension. One sister died with acute leukemia.  Social history: She is retired from the [employer]. Married. One adult daughter. One grandchild. 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      Review of Systems no new complaints but see above for issues       Objective:   Physical Exam Blood pressure 130/82 pulse seventy-two pulse oximetry 96% weight 167 pounds height 5 feet 7 inches BMI 26.16  Skin warm and dry. No cervical adenopathy. No carotid bruits. Chest is clear to auscultation. Cardiac exam: Regular rate and rhythm normal S1 and S2 without murmurs or gallops. Abdomen is soft nondistended without hepatosplenomegaly masses or tenderness. Breasts are without masses. Extremities without pitting edema. Muscle strength in the lower extremities is normal. Straight leg raising is negative at 90 degrees bilaterally. Neuro intact without focal deficits. Affect thought and judgment are normal. Pap taken. Bimanual normal.       Assessment & Plan:  Essential hypertension-stable on current medication  Hyperlipidemia treated with statin medication  History of smoking and continues to smoke  Chronic pain in right buttock and leg-worse at night being evaluated by vascular surgeon for vascular disease. This is been present for several years. MRI of the LS spine in  2018 showed no evidence of final stenosis or disc herniation or neural impingement.  Plan: Follow-up here in 6 months and continue current medications.  Addendum: Patient has abnormal Pap smear and will be referred to GYN for evaluation.

## 2020-06-21 ENCOUNTER — Other Ambulatory Visit: Payer: Self-pay | Admitting: Internal Medicine

## 2020-06-21 LAB — CYTOLOGY - PAP
Comment: NEGATIVE
Diagnosis: UNDETERMINED — AB
High risk HPV: POSITIVE — AB

## 2020-07-01 NOTE — Patient Instructions (Addendum)
Follow-up with vascular surgeon soon. Continue statin medication. Please continue smoking cessation efforts. Continue medication for hypertension. Follow-up in 6 months.  Addendum: Patient has abnormal Pap smear and will be referred to GYN for evaluation

## 2020-07-06 ENCOUNTER — Ambulatory Visit
Admission: RE | Admit: 2020-07-06 | Discharge: 2020-07-06 | Disposition: A | Discharge status: Home / Self Care | Payer: 59 | Source: Ambulatory Visit | Attending: Vascular Surgery | Admitting: Vascular Surgery

## 2020-07-06 DIAGNOSIS — I70213 Atherosclerosis of native arteries of extremities with intermittent claudication, bilateral legs: Secondary | ICD-10-CM

## 2020-07-06 MED ORDER — IOPAMIDOL (ISOVUE-370) INJECTION 76%
125.0000 mL | Freq: Once | INTRAVENOUS | Status: AC | PRN
  Administered 2020-07-06: 125 mL via INTRAVENOUS

## 2020-07-10 ENCOUNTER — Encounter: Payer: Self-pay | Admitting: Vascular Surgery

## 2020-07-10 ENCOUNTER — Other Ambulatory Visit: Payer: Self-pay

## 2020-07-10 ENCOUNTER — Ambulatory Visit: Payer: 59 | Admitting: Vascular Surgery

## 2020-07-10 VITALS — BP 155/81 | HR 62 | Temp 97.3°F | Resp 16 | Ht 67.0 in | Wt 166.0 lb

## 2020-07-10 DIAGNOSIS — I739 Peripheral vascular disease, unspecified: Secondary | ICD-10-CM

## 2020-07-10 NOTE — Progress Notes (Signed)
Patient name: DEVANY AJA MRN: [id number] DOB: Aug 30, 1956 Sex: female  REASON FOR VISIT: 72-month follow-up for bilateral lower extremity buttock claudication  HPI: GOLDIE TREGONING is a 64 y.o. female with history of hypertension, hyperlipidemia, tobacco abuse that presents for 55-month follow-up for bilateral lower extremity buttock claudication.  She has been followed by Dr. Donnetta Hutching for at least the last 3 years.  She was last seen approximately 6 months ago.  States her symptoms may be slightly improved since last visit.  She is now walking about 200 feet before she has to stop.  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  Does not feel this is any worse on today's visit.  She does have some odd symptoms in her legs at night at times but no other overt rest pain symptoms.  No tissue loss at this time.  Still smoking half pack a day.  No previous interventions.  Did get a CTA prior to today's visit ordered by Dr. Donnetta Hutching last time.  Past Medical History:  Diagnosis Date  . Claudication of gluteal region ([place]) 01/18/2018  . Elevated homocysteine   . GERD (gastroesophageal reflux disease)   . Hyperlipidemia   . Hypertension     Past Surgical History:  Procedure Laterality Date  . BREAST SURGERY     l breast biopsy  . CARDIAC VALVE REPLACEMENT    . CESAREAN SECTION    . TUBAL LIGATION      Family History  Problem Relation Age of Onset  . Cancer Sister     SOCIAL HISTORY: Social History   Tobacco Use  . Smoking status: Current Every Day Smoker    Packs/day: 0.50    Types: Cigarettes  . Smokeless tobacco: Never Used  . Tobacco comment: 10 cigarettes/day  Substance Use Topics  . Alcohol use: Yes    Comment: socially    Allergies  Allergen Reactions  . Penicillins Hives    Current Outpatient Medications  Medication Sig Dispense Refill  . Cholecalciferol (VITAMIN D3) 2000 units TABS Take 1 tablet by mouth daily.    . hydrochlorothiazide  (HYDRODIURIL) 25 MG tablet Take 1 tablet by mouth once daily 90 tablet 0  . ibuprofen (ADVIL,MOTRIN) 200 MG tablet Take 200 mg by mouth every 6 (six) hours as needed.    . metoprolol succinate (TOPROL-XL) 50 MG 24 hr tablet Take 1 tablet by mouth once daily 90 tablet 0  . rosuvastatin (CRESTOR) 20 MG tablet Take 1 tablet by mouth once daily 90 tablet 3   No current facility-administered medications for this visit.    REVIEW OF SYSTEMS:  [X]  denotes positive finding, [ ]  denotes negative finding Cardiac  Comments:  Chest pain or chest pressure:    Shortness of breath upon exertion:    Short of breath when lying flat:    Irregular heart rhythm:        Vascular    Pain in calf, thigh, or hip brought on by ambulation: x   Pain in feet at night that wakes you up from your sleep:     Blood clot in your veins:    Leg swelling:         Pulmonary    Oxygen at home:    Productive cough:     Wheezing:         Neurologic    Sudden weakness in arms or legs:     Sudden numbness in arms or legs:  Sudden onset of difficulty speaking or slurred speech:    Temporary loss of vision in one eye:     Problems with dizziness:         Gastrointestinal    Blood in stool:     Vomited blood:         Genitourinary    Burning when urinating:     Blood in urine:        Psychiatric    Major depression:         Hematologic    Bleeding problems:    Problems with blood clotting too easily:        Skin    Rashes or ulcers:        Constitutional    Fever or chills:      PHYSICAL EXAM: Vitals:   07/10/20 1035  BP: (!) 155/81  Pulse: 62  Resp: 16  Temp: (!) 97.3 F (36.3 C)  TempSrc: Temporal  SpO2: 95%  Weight: 166 lb (75.3 kg)  Height: 5\' 7"  (1.702 m)    GENERAL: The patient is a well-nourished female, in no acute distress. The vital signs are documented above. CARDIAC: There is a regular rate and rhythm.  VASCULAR:  Unable to appreciate any femoral or distal pulses No active  tissue loss PULMONARY: No respiratory distress. ABDOMEN: Soft and non-tender with normal pitched bowel sounds.  MUSCULOSKELETAL: There are no major deformities or cyanosis. NEUROLOGIC: No focal weakness or paresthesias are detected. SKIN: There are no ulcers or rashes noted. PSYCHIATRIC: The patient has a normal affect.  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  Assessment/Plan:  64 year old female with history of tobacco abuse that presents with bilateral lower extremity buttock claudication.  In review of her CTA she does have large calcified plaque in the distal abdominal aorta as well as significant bilateral common iliac artery disease.  Fortunately her claudication symptoms are stable and she has had no progression over the last 6 months.  We discussed that claudication is not typically a limb threatening situation.  Discussed that there should only be a role for intervention if she feels her symptoms are worsening and unable to tolerate and become lifestyle limiting.  We discussed the importance of continue walking therapies as well as smoking cessation.  I will see her back in 6 months.  She knows to call with questions or concerns.  I think she does have an option for endovascular repair with likely distal infrarenal aortic stent and kissing iliac stents.   Marty Heck, MD [facility] [place] Office: [phone]

## 2020-07-17 ENCOUNTER — Other Ambulatory Visit: Payer: Self-pay

## 2020-07-17 ENCOUNTER — Encounter: Payer: Self-pay | Admitting: Obstetrics and Gynecology

## 2020-07-17 ENCOUNTER — Ambulatory Visit (INDEPENDENT_AMBULATORY_CARE_PROVIDER_SITE_OTHER): Payer: 59 | Admitting: Obstetrics and Gynecology

## 2020-07-17 ENCOUNTER — Other Ambulatory Visit (HOSPITAL_COMMUNITY)
Admission: RE | Admit: 2020-07-17 | Discharge: 2020-07-17 | Disposition: A | Discharge status: Home / Self Care | Payer: 59 | Source: Ambulatory Visit | Attending: Obstetrics and Gynecology | Admitting: Obstetrics and Gynecology

## 2020-07-17 VITALS — BP 118/68 | HR 88 | Ht 67.0 in | Wt 167.0 lb

## 2020-07-17 DIAGNOSIS — R87610 Atypical squamous cells of undetermined significance on cytologic smear of cervix (ASC-US): Secondary | ICD-10-CM | POA: Insufficient documentation

## 2020-07-17 DIAGNOSIS — B977 Papillomavirus as the cause of diseases classified elsewhere: Secondary | ICD-10-CM | POA: Insufficient documentation

## 2020-07-17 NOTE — Progress Notes (Signed)
64 y.o. [id number] Married White or Caucasian Not Hispanic or Latino female here for a consultation from Dr Tedra Senegal for an abnormal pap results.  Pap from 06/18/20 was ASCUS, +HPV  She has been married for over 30 years. They haven't been sexually active for years secondary to pain. She tried having sex last month, it was painful and she noticed some bleeding after. Didn't use a lubricant. They are okay being sexually active without penetration.     No LMP recorded. Patient is postmenopausal.          Sexually active: No.  The current method of family planning is post menopausal status.    Exercising: Yes.    walking  Smoker:  yes  Health Maintenance: Pap:  06/18/20 ASCUS HPV + History of abnormal Pap:  yes MMG:  3/9/21density C Bi-rads 2 benign  BMD:   None  Colonoscopy: none  TDaP:  02/14/11 Gardasil: NA   reports that she has been smoking cigarettes. She has been smoking about 0.50 packs per day. She has never used smokeless tobacco. She reports current alcohol use. She reports that she does not use drugs. Her daughter is 72 and her granddaughter is 39, they are local.   Past Medical History:  Diagnosis Date  . Claudication of gluteal region ([place]) 01/18/2018  . Elevated homocysteine   . GERD (gastroesophageal reflux disease)   . Hyperlipidemia   . Hypertension     Past Surgical History:  Procedure Laterality Date  . BREAST SURGERY     l breast biopsy  . CESAREAN SECTION    . TUBAL LIGATION      Current Outpatient Medications  Medication Sig Dispense Refill  . Cholecalciferol (VITAMIN D3) 2000 units TABS Take 1 tablet by mouth daily.    . hydrochlorothiazide (HYDRODIURIL) 25 MG tablet Take 1 tablet by mouth once daily 90 tablet 0  . ibuprofen (ADVIL,MOTRIN) 200 MG tablet Take 200 mg by mouth every 6 (six) hours as needed.    . metoprolol succinate (TOPROL-XL) 50 MG 24 hr tablet Take 1 tablet by mouth once daily 90 tablet 0  . rosuvastatin (CRESTOR) 20 MG tablet Take 1  tablet by mouth once daily 90 tablet 3   No current facility-administered medications for this visit.    Family History  Problem Relation Age of Onset  . Cancer Sister   . Breast cancer Sister   . Breast cancer Paternal Aunt     Review of Systems  Genitourinary: Positive for vaginal bleeding.  All other systems reviewed and are negative.   Exam:   BP 118/68   Pulse 88   Ht 5\' 7"  (1.702 m)   Wt 167 lb (75.8 kg)   SpO2 98%   BMI 26.16 kg/m   Weight change: @WEIGHTCHANGE @ Height:   Height: 5\' 7"  (170.2 cm)  Ht Readings from Last 3 Encounters:  07/17/20 5\' 7"  (1.702 m)  07/10/20 5\' 7"  (1.702 m)  06/18/20 5\' 7"  (1.702 m)    General appearance: alert, cooperative and appears stated age   Pelvic: External genitalia:  no lesions              Urethra:  normal appearing urethra with no masses, tenderness or lesions              Bartholins and Skenes: normal                 Vagina: atrophic appearing vagina with normal color and discharge, no lesions  Cervix: no gross lesions                Colposcopy: unsatisfactory, no aceto-white changes on the cervix, ? Whitening on the left vaginal side wall. Lugols examination of the cervix and vagina. Area's of decreased lugols uptake on both lateral vaginal walls near the apex. Vaginal biopsies at 3 and 9 o'clock. ECC done  Gae Dry chaperoned for the exam.  A:  Well Woman with normal exam  Vaginal atrophy  Dyspareunia with an episode of post coital bleeding  P:   Vaginal biopsies at 3 and 9 o'clock, ECC  Further plans depending on results.  Discussed vaginal lubrication with intercourse, discussed the option of vaginal estrogen. She declines estrogen at this time  CC: Dr Renold Genta Note sent

## 2020-07-17 NOTE — Patient Instructions (Signed)

## 2020-07-20 LAB — SURGICAL PATHOLOGY

## 2020-07-23 ENCOUNTER — Other Ambulatory Visit: Payer: Self-pay | Admitting: Internal Medicine

## 2020-07-31 LAB — COLOGUARD
Cologuard: NEGATIVE
Cologuard: NEGATIVE

## 2020-08-01 ENCOUNTER — Encounter: Payer: Self-pay | Admitting: Internal Medicine

## 2020-10-26 ENCOUNTER — Other Ambulatory Visit: Payer: Self-pay | Admitting: Internal Medicine

## 2020-11-13 LAB — HM MAMMOGRAPHY

## 2020-11-15 ENCOUNTER — Encounter: Payer: Self-pay | Admitting: Internal Medicine

## 2020-12-20 ENCOUNTER — Other Ambulatory Visit: Payer: 59 | Admitting: Internal Medicine

## 2020-12-20 ENCOUNTER — Other Ambulatory Visit: Payer: Self-pay

## 2020-12-20 DIAGNOSIS — E782 Mixed hyperlipidemia: Secondary | ICD-10-CM

## 2020-12-21 ENCOUNTER — Encounter: Payer: Self-pay | Admitting: Internal Medicine

## 2020-12-21 ENCOUNTER — Ambulatory Visit: Payer: 59 | Admitting: Internal Medicine

## 2020-12-21 VITALS — BP 130/60 | HR 69 | Ht 67.0 in | Wt 162.0 lb

## 2020-12-21 DIAGNOSIS — F17200 Nicotine dependence, unspecified, uncomplicated: Secondary | ICD-10-CM | POA: Diagnosis not present

## 2020-12-21 DIAGNOSIS — E786 Lipoprotein deficiency: Secondary | ICD-10-CM | POA: Diagnosis not present

## 2020-12-21 DIAGNOSIS — Z8659 Personal history of other mental and behavioral disorders: Secondary | ICD-10-CM

## 2020-12-21 DIAGNOSIS — M7918 Myalgia, other site: Secondary | ICD-10-CM

## 2020-12-21 DIAGNOSIS — I10 Essential (primary) hypertension: Secondary | ICD-10-CM

## 2020-12-21 DIAGNOSIS — I739 Peripheral vascular disease, unspecified: Secondary | ICD-10-CM

## 2020-12-21 DIAGNOSIS — I700 Atherosclerosis of aorta: Secondary | ICD-10-CM

## 2020-12-21 DIAGNOSIS — E782 Mixed hyperlipidemia: Secondary | ICD-10-CM

## 2020-12-21 DIAGNOSIS — K5904 Chronic idiopathic constipation: Secondary | ICD-10-CM

## 2020-12-21 DIAGNOSIS — I708 Atherosclerosis of other arteries: Secondary | ICD-10-CM

## 2020-12-21 LAB — CBC WITH DIFFERENTIAL/PLATELET
Absolute Monocytes: 882 cells/uL (ref 200–950)
Basophils Absolute: 76 cells/uL (ref 0–200)
Basophils Relative: 0.6 %
Eosinophils Absolute: 113 cells/uL (ref 15–500)
Eosinophils Relative: 0.9 %
HCT: 48.4 % — ABNORMAL HIGH (ref 35.0–45.0)
Hemoglobin: 16 g/dL — ABNORMAL HIGH (ref 11.7–15.5)
Lymphs Abs: 4007 cells/uL — ABNORMAL HIGH (ref 850–3900)
MCH: 29.6 pg (ref 27.0–33.0)
MCHC: 33.1 g/dL (ref 32.0–36.0)
MCV: 89.6 fL (ref 80.0–100.0)
MPV: 9.6 fL (ref 7.5–12.5)
Monocytes Relative: 7 %
Neutro Abs: 7522 cells/uL (ref 1500–7800)
Neutrophils Relative %: 59.7 %
Platelets: 316 10*3/uL (ref 140–400)
RBC: 5.4 10*6/uL — ABNORMAL HIGH (ref 3.80–5.10)
RDW: 13.1 % (ref 11.0–15.0)
Total Lymphocyte: 31.8 %
WBC: 12.6 10*3/uL — ABNORMAL HIGH (ref 3.8–10.8)

## 2020-12-21 LAB — LIPID PANEL
Cholesterol: 128 mg/dL (ref <200)
HDL: 36 mg/dL — ABNORMAL LOW (ref >=50)
LDL Cholesterol (Calc): 66 mg/dL (calc)
Non-HDL Cholesterol (Calc): 92 mg/dL (calc) (ref <130)
Total CHOL/HDL Ratio: 3.6 (calc) (ref <5.0)
Triglycerides: 190 mg/dL — ABNORMAL HIGH (ref <150)

## 2020-12-21 MED ORDER — HYDROCORTISONE ACE-PRAMOXINE 1-1 % EX CREA: 1.0000 "application " | TOPICAL_CREAM | Freq: Two times a day (BID) | CUTANEOUS | 99 refills | Status: DC

## 2020-12-21 NOTE — Progress Notes (Signed)
   Subjective:    Patient ID: Kristy Black, female    DOB: Jan 24, 1956, 65 y.o.   MRN: [id number]  HPI 65 year old Female returns for 60-month follow-up.  She has a history of smoking, hypertension, constipation treated with MiraLAX, anxiety, hyperlipidemia and GE reflux.  Continues to smoke and has smoked for well over 20 years.  She has cut back considerably.  History of fibrocystic breast disease and is status post 2 surgeries for fibroadenomas and benign left breast biopsy in 2007.  Main issue is considerable right hip and buttock pain despite negative MRI of the LS spine and negative hip films.  Is being seen by Dr. Monica Martinez for claudication in the gluteal region.  She is on Crestor.  She has a low HDL of 36.  Triglycerides are still high at 190.  She does not want to take additional medication.  Total cholesterol is 128 and LDL is 66.  She did Cologuard test for colon cancer screening in November 2021 which was negative.  History of hypertension treated with metoprolol and HCTZ.  She has had an MRI of the LS spine in 2018 that did not reveal any spinal stenosis or neural impingement or disc herniation.  The pain bothers her every night and interferes with her sleep.  She has a history of calcified plaque in the distal abdominal aorta as well as significant bilateral common iliac artery disease.  Dr. Carlis Abbott has discussed vascular surgery, walking therapy and smoking cessation.   Review of Systems see above-due to claudication symptoms it is difficult for her to exercise.  She does not sleep well due to buttock pain     Objective:   Physical Exam Blood pressure 130/60 pulse 69 regular pulse oximetry 98% weight 162 pounds BMI 25.37  Skin: Warm and dry.  No cervical adenopathy.  Chest clear to auscultation.  Cardiac exam regular rate and rhythm normal S1 and S2.  No lower extremity pitting edema.  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 Talbert Nan in November 2021.  Had colposcopy showing low-grade dysplasia CIN-1 in 2 specimens of vaginal biopsies at 3:00 and 9:00.  Endocervical curettage was negative for dysplasia.  Plan: She will be seeing Dr. Carlis Abbott soon for follow-up.  As always advised her to quit smoking.  Return in 6 months and continue current medications.

## 2021-01-04 ENCOUNTER — Other Ambulatory Visit: Payer: Self-pay | Admitting: *Deleted

## 2021-01-04 DIAGNOSIS — I70213 Atherosclerosis of native arteries of extremities with intermittent claudication, bilateral legs: Secondary | ICD-10-CM

## 2021-01-22 ENCOUNTER — Other Ambulatory Visit: Payer: Self-pay

## 2021-01-22 ENCOUNTER — Encounter: Payer: Self-pay | Admitting: Vascular Surgery

## 2021-01-22 ENCOUNTER — Ambulatory Visit: Payer: 59 | Admitting: Vascular Surgery

## 2021-01-22 ENCOUNTER — Ambulatory Visit (HOSPITAL_COMMUNITY)
Admission: RE | Admit: 2021-01-22 | Discharge: 2021-01-22 | Disposition: A | Discharge status: Home / Self Care | Payer: 59 | Source: Ambulatory Visit | Attending: Vascular Surgery | Admitting: Vascular Surgery

## 2021-01-22 DIAGNOSIS — I739 Peripheral vascular disease, unspecified: Secondary | ICD-10-CM | POA: Insufficient documentation

## 2021-01-22 DIAGNOSIS — I70213 Atherosclerosis of native arteries of extremities with intermittent claudication, bilateral legs: Secondary | ICD-10-CM | POA: Diagnosis present

## 2021-01-22 NOTE — Progress Notes (Signed)
Patient name: Kristy Black MRN: [id number] DOB: 07-Jun-1956 Sex: female  REASON FOR VISIT: 19-month follow-up for bilateral lower extremity buttock claudication  HPI: Kristy Black is a 65 y.o. female with history of hypertension, hyperlipidemia, tobacco abuse that presents for 66-month follow-up for bilateral lower extremity buttock claudication.  She has previously been followed by Dr. Donnetta Hutching.  She was last seen by me approximately 6 months ago.  She feels that her symptoms are stable.  States she can walk about 150 feet without stopping.  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  She previously had a CTA at last visit 6 months ago that showed significant distal aortic and iliac disease.  Still smoking half pack a day.  No classic rest pain symptoms at night according to the patient but does get some cramping.  No wounds.  Past Medical History:  Diagnosis Date  . Claudication of gluteal region ([place]) 01/18/2018  . Elevated homocysteine   . GERD (gastroesophageal reflux disease)   . Hyperlipidemia   . Hypertension     Past Surgical History:  Procedure Laterality Date  . BREAST SURGERY     l breast biopsy  . CESAREAN SECTION    . TUBAL LIGATION      Family History  Problem Relation Age of Onset  . Cancer Sister   . Breast cancer Sister   . Breast cancer Paternal Aunt     SOCIAL HISTORY: Social History   Tobacco Use  . Smoking status: Current Every Day Smoker    Packs/day: 0.50    Types: Cigarettes  . Smokeless tobacco: Never Used  . Tobacco comment: 10 cigarettes/day  Substance Use Topics  . Alcohol use: Yes    Comment: socially    Allergies  Allergen Reactions  . Penicillins Hives    Current Outpatient Medications  Medication Sig Dispense Refill  . Cholecalciferol (VITAMIN D3) 2000 units TABS Take 1 tablet by mouth daily.    . hydrochlorothiazide (HYDRODIURIL) 25 MG tablet Take 1 tablet by mouth once daily 90 tablet 3  . ibuprofen  (ADVIL,MOTRIN) 200 MG tablet Take 200 mg by mouth every 6 (six) hours as needed.    . metoprolol succinate (TOPROL-XL) 50 MG 24 hr tablet Take 1 tablet by mouth once daily 90 tablet 3  . pramoxine-hydrocortisone (PROCTOCREAM-HC) 1-1 % rectal cream Place 1 application rectally 2 (two) times daily. 30 g prn  . rosuvastatin (CRESTOR) 20 MG tablet Take 1 tablet by mouth once daily 90 tablet 3   No current facility-administered medications for this visit.    REVIEW OF SYSTEMS:  [X]  denotes positive finding, [ ]  denotes negative finding Cardiac  Comments:  Chest pain or chest pressure:    Shortness of breath upon exertion:    Short of breath when lying flat:    Irregular heart rhythm:        Vascular    Pain in calf, thigh, or hip brought on by ambulation: x   Pain in feet at night that wakes you up from your sleep:     Blood clot in your veins:    Leg swelling:         Pulmonary    Oxygen at home:    Productive cough:     Wheezing:         Neurologic    Sudden weakness in arms or legs:     Sudden numbness in arms or legs:     Sudden onset of  difficulty speaking or slurred speech:    Temporary loss of vision in one eye:     Problems with dizziness:         Gastrointestinal    Blood in stool:     Vomited blood:         Genitourinary    Burning when urinating:     Blood in urine:        Psychiatric    Major depression:         Hematologic    Bleeding problems:    Problems with blood clotting too easily:        Skin    Rashes or ulcers:        Constitutional    Fever or chills:      PHYSICAL EXAM: Vitals:   01/22/21 1526  BP: 136/74  Pulse: (!) 59  Resp: 16  Temp: 98 F (36.7 C)  SpO2: 98%  Weight: 162 lb 11.2 oz (73.8 kg)  Height: 5\' 7"  (1.702 m)    GENERAL: The patient is a well-nourished female, in no acute distress. The vital signs are documented above. CARDIAC: There is a regular rate and rhythm.  VASCULAR:  Unable to appreciate any femoral or  distal pulses No active tissue loss PULMONARY: No respiratory distress. ABDOMEN: Soft and non-tender. MUSCULOSKELETAL: There are no major deformities or cyanosis. NEUROLOGIC: No focal weakness or paresthesias are detected. SKIN: There are no ulcers or rashes noted. PSYCHIATRIC: The patient has a normal affect.  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  ABIs 0.36 on the right monophasic and 0.39 on the left monophasic  Assessment/Plan:  65 year old female with history of tobacco abuse that presents for 6 month follow-up of her bilateral lower extremity buttock claudication.  Prior CTA showed large calcified plaque in the distal abdominal aorta as well as significant bilateral common iliac artery disease.  Her ABIs have dropped as I discussed with her today.  That being said she feels her symptoms are very stable.  Discussed would proceed with aortogram if and when she decides her symptoms are debilitating.  Would attempt endovascular intervention first.  Discussed if this fails or was not durable would lielyl; need aortobifemoral bypass.  Ultimately she wants to return in 6 months.  I discussed she let me know if her symptoms worsen.   Marty Heck, MD [facility] Office: [phone]

## 2021-01-23 NOTE — Progress Notes (Signed)
Agree with you. Thank you for seeing her.

## 2021-01-25 ENCOUNTER — Other Ambulatory Visit: Payer: Self-pay

## 2021-01-25 DIAGNOSIS — I739 Peripheral vascular disease, unspecified: Secondary | ICD-10-CM

## 2021-01-27 NOTE — Patient Instructions (Signed)
It was a pleasure to see you today.  Continue current medications and follow-up in 6 months.  Please quit smoking.

## 2021-03-16 IMAGING — CT CT ANGIO AOBIFEM WO/W CM
1 of 2 series · 13 of 32 positions shown, 17 images · IV contrast (APPLIED)
Comparison: None.

CLINICAL DATA: 63-year-old with claudication. Pain in hips with
walking.

EXAM:
CT ANGIOGRAPHY OF ABDOMINAL AORTA WITH ILIOFEMORAL RUNOFF
TECHNIQUE: Multidetector CT imaging of the abdomen, pelvis and lower
extremities was performed using the standard protocol during bolus
administration of intravenous contrast. Multiplanar CT image
reconstructions and MIPs were obtained to evaluate the vascular
anatomy.
CONTRAST:  125mL 7G052X-PWD IOPAMIDOL (7G052X-PWD) INJECTION 76%

[Series 5: angioao-bifem 3mm · axial · 0.86mm/px · z∈[-1264,-49]mm · 13 of 273 slices shown, 17 images]
[im 20/273  soft-tissue]
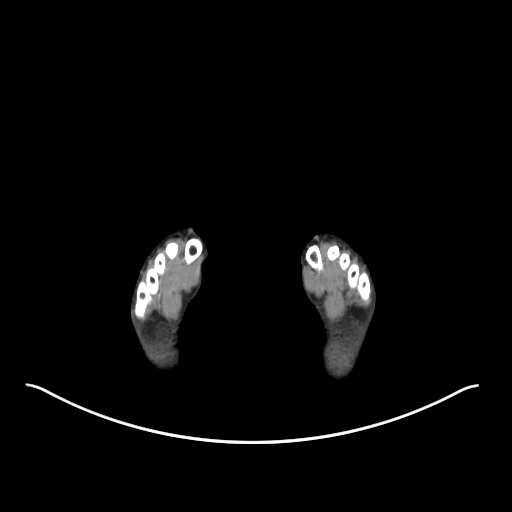
[im 20/273  bone]
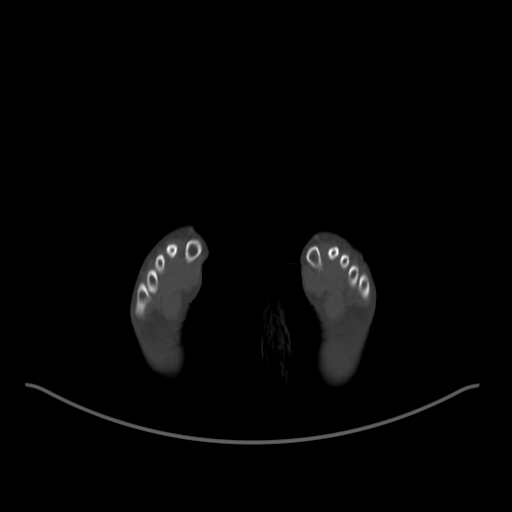
[im 39/273  soft-tissue]
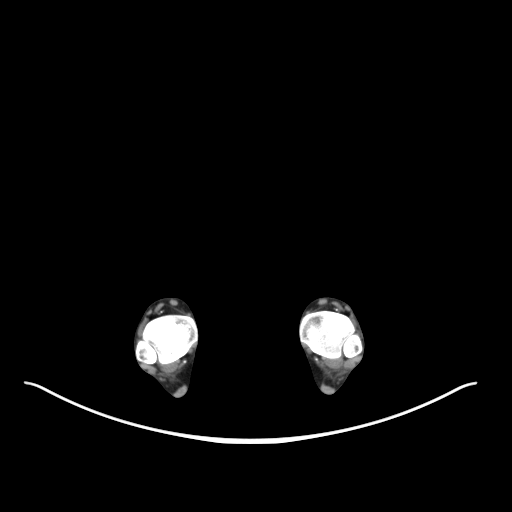
[im 69/273  soft-tissue]
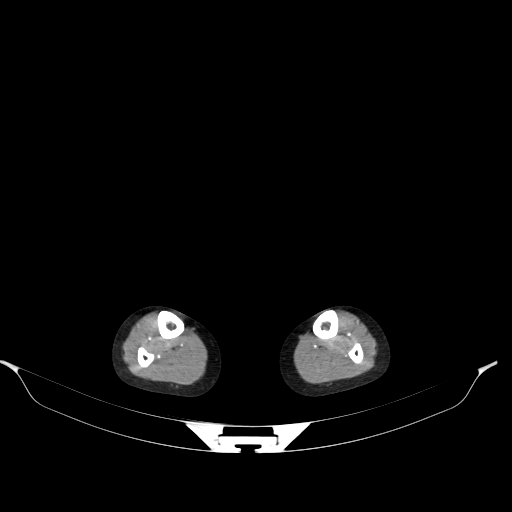
[im 88/273  soft-tissue]
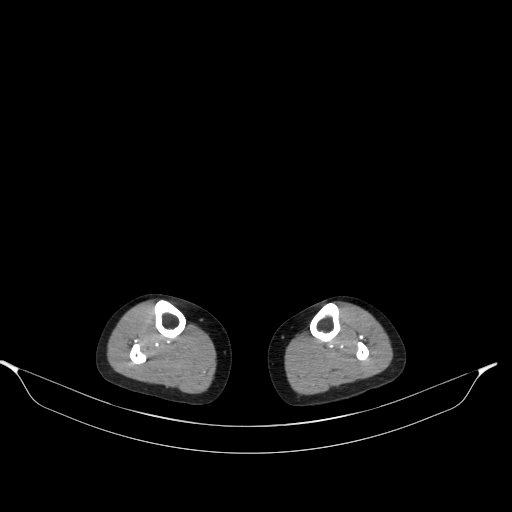
[im 117/273  soft-tissue]
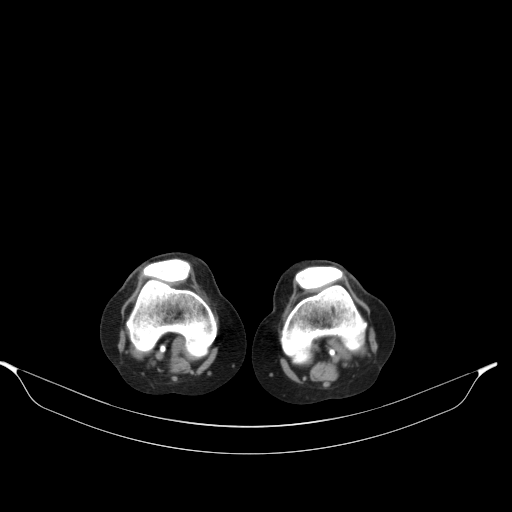
[im 137/273  soft-tissue]
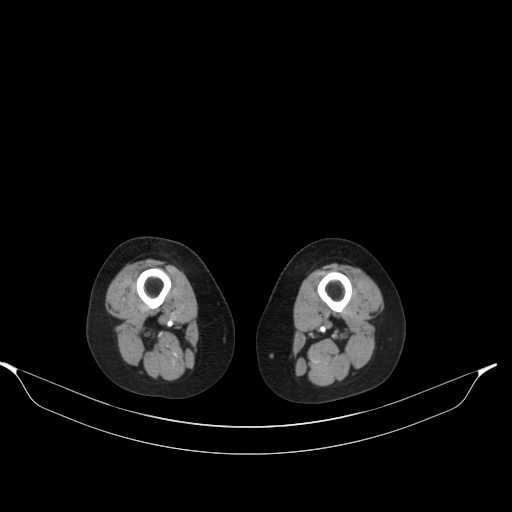
[im 156/273  soft-tissue]
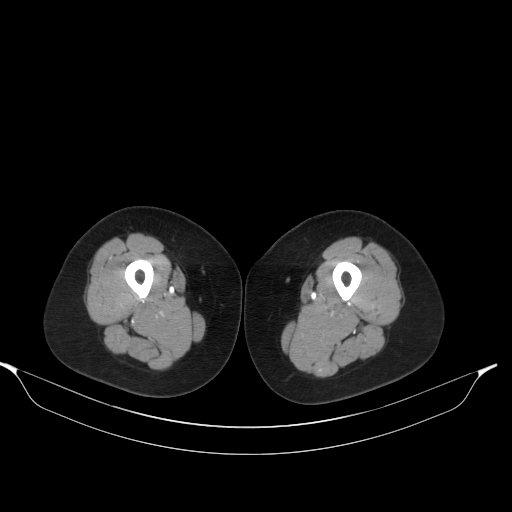
[im 185/273  soft-tissue]
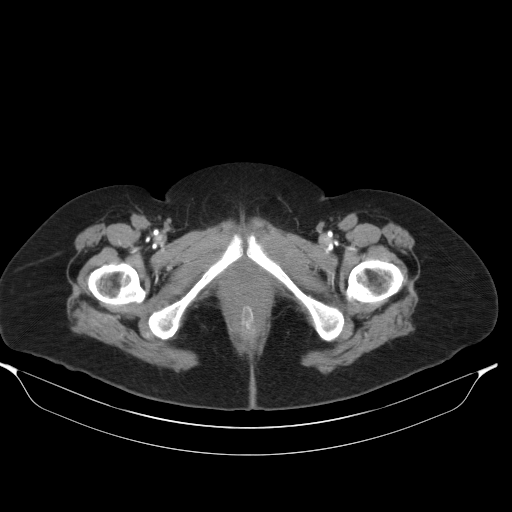
[im 204/273  soft-tissue]
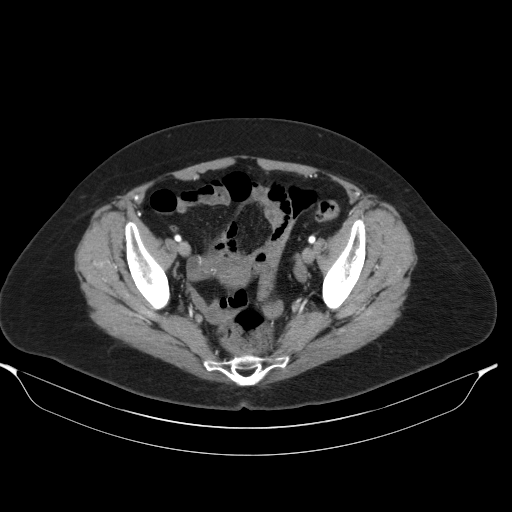
[im 204/273  bone]
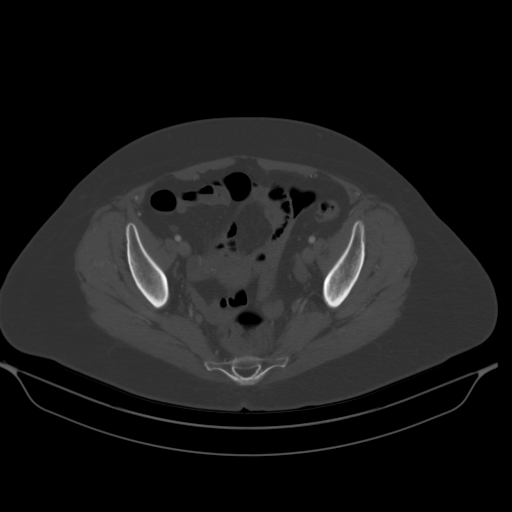
[im 234/273  soft-tissue]
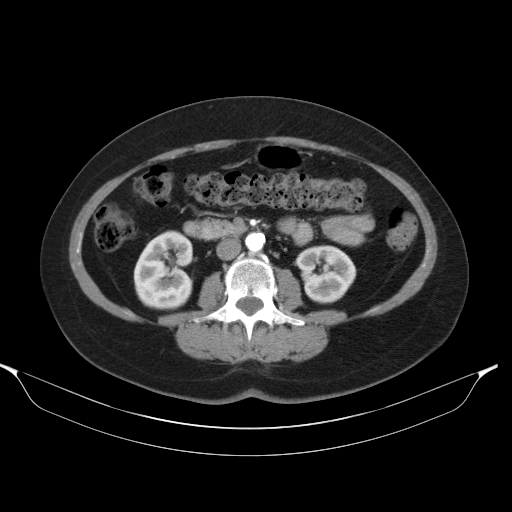
[im 234/273  lung]
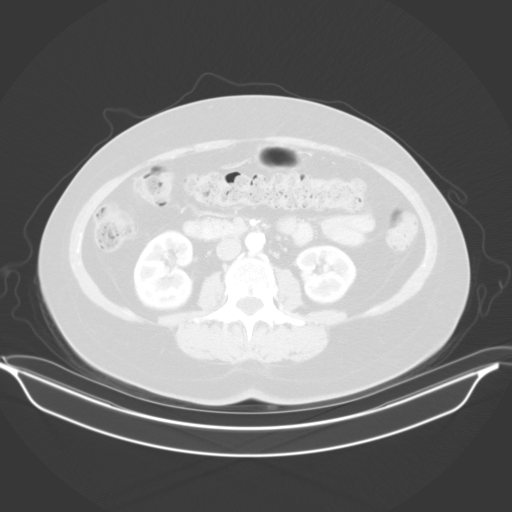
[im 243/273  lung]
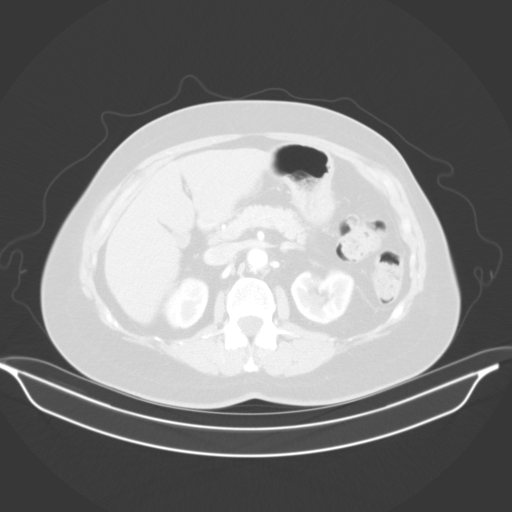
[im 253/273  soft-tissue]
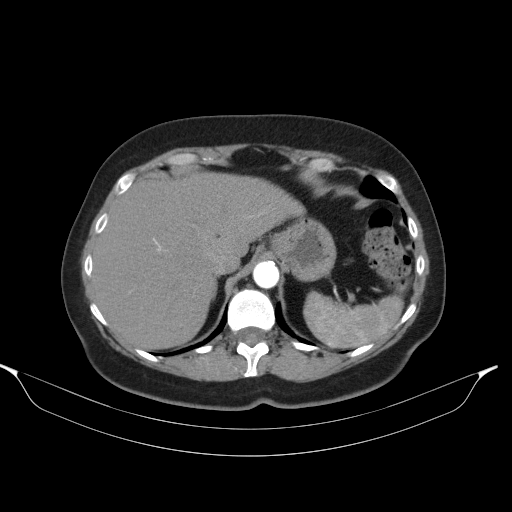
[im 253/273  lung]
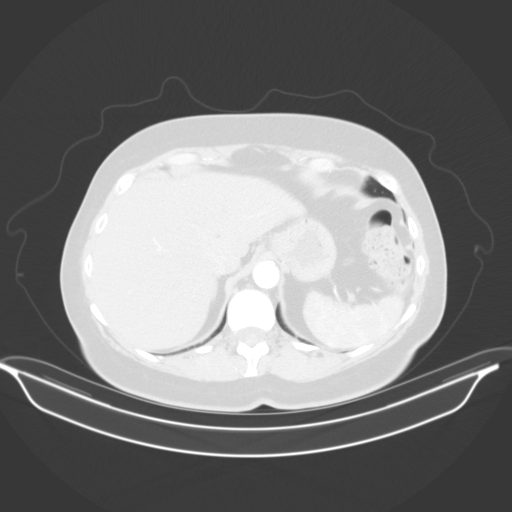
[im 263/273  lung]
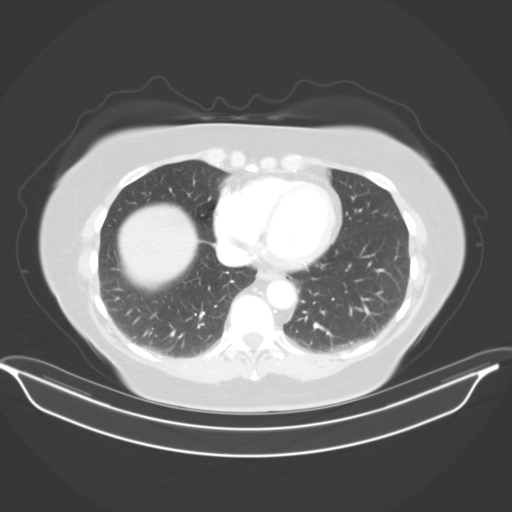

[13 of 32 positions shown; findings below may reference images not displayed]

FINDINGS: VASCULAR

Aorta: Distal descending thoracic aorta measures 2.3 cm. Small
amount of posterior mural thrombus involving the suprarenal aorta
without aneurysm or dissection. Large amount of calcified plaque in
the infrarenal abdominal aorta with a large focus of calcified
plaque in the central lumen approximately 4 cm above the
bifurcation. Diffuse narrowing of the distal abdominal aorta at the
aortic bifurcation.

Celiac: Patent without evidence of aneurysm, dissection, vasculitis
or significant stenosis.

SMA: Patent without evidence of aneurysm, dissection, vasculitis or
significant stenosis.

Renals: Both renal arteries are patent without evidence of aneurysm,
dissection, vasculitis, fibromuscular dysplasia or significant
stenosis.

IMA: Patent

RIGHT Lower Extremity

Inflow: Large amount of calcified plaque at the aortic bifurcation
and the origin of the right common iliac artery. Evidence for severe
stenosis at the origin of the right common iliac artery. Distal
right common iliac artery is small measuring approximately 4 mm in
diameter. Right internal and right external iliac arteries are
patent.

Outflow: Right common femoral artery is patent with a small amount
of plaque. Right profunda femoral arteries are patent. Right SFA is
widely patent without plaque or stenosis. Right popliteal artery is
widely patent without plaque or stenosis.

Runoff: Anterior tibial artery and peroneal artery are patent to the
ankle. Posterior tibial artery is very small in the mid/distal calf
with collateral flow from the distal peroneal artery.

LEFT Lower Extremity

Inflow: Severe stenosis at the aortic bifurcation and origin of the
left common iliac artery. Left common iliac artery is patent. Distal
left common iliac artery measures to 6 mm. Left internal and left
external iliac arteries are patent. Mild focal dilatation of the
proximal left internal iliac artery measuring up to 7 mm.

Outflow: Common, superficial and profunda femoral arteries and the
popliteal artery are patent without evidence of aneurysm,
dissection, vasculitis or significant stenosis.

Runoff: Anterior tibial artery and posterior tibial artery are
patent to the ankle. Distal peroneal artery is small but patent.

Veins: No obvious venous abnormality within the limitations of this
arterial phase study.

Review of the MIP images confirms the above findings.

NON-VASCULAR

Lower chest: Lung bases are clear. Small linear density along the
pleural surface of the right middle lobe on sequence 5, image 18
likely represents atelectasis or scarring. No pleural effusions.

Hepatobiliary: Normal appearance of the liver and gallbladder. No
biliary dilatation.

Pancreas: Unremarkable. No pancreatic ductal dilatation or
surrounding inflammatory changes.

Spleen: Normal in size without focal abnormality.

Adrenals/Urinary Tract: Normal appearance of the adrenal glands. 8
mm hypodensity in left kidney lower pole is too small to
definitively characterize. No hydronephrosis. Normal appearance of
the urinary bladder.

Stomach/Bowel: Stomach is within normal limits. Appendix appears
normal. No evidence of bowel wall thickening, distention, or
inflammatory changes.

Lymphatic: No abdominopelvic lymphadenopathy.

Reproductive: Uterus and bilateral adnexa are unremarkable.

Other: No ascites.  Small umbilical hernia containing fat.

Musculoskeletal: No acute or significant osseous findings.
IMPRESSION: VASCULAR

1. Bilateral inflow disease. Severe atherosclerotic disease
involving the infrarenal abdominal aorta, particularly at the aortic
bifurcation. Severe stenosis involving the origin of the common
iliac arteries bilaterally.
2. Bilateral outflow and runoff vessels are patent.

NON-VASCULAR

1. No acute abnormality in the abdomen or pelvis.

## 2021-03-28 ENCOUNTER — Encounter: Payer: Self-pay | Admitting: Internal Medicine

## 2021-03-28 ENCOUNTER — Other Ambulatory Visit: Payer: Self-pay

## 2021-03-28 ENCOUNTER — Telehealth: Payer: Self-pay | Admitting: Internal Medicine

## 2021-03-28 ENCOUNTER — Ambulatory Visit: Payer: 59 | Admitting: Internal Medicine

## 2021-03-28 VITALS — BP 140/80 | HR 72 | Temp 98.8°F | Wt 157.0 lb

## 2021-03-28 DIAGNOSIS — L97521 Non-pressure chronic ulcer of other part of left foot limited to breakdown of skin: Secondary | ICD-10-CM | POA: Diagnosis not present

## 2021-03-28 DIAGNOSIS — R21 Rash and other nonspecific skin eruption: Secondary | ICD-10-CM

## 2021-03-28 DIAGNOSIS — L97511 Non-pressure chronic ulcer of other part of right foot limited to breakdown of skin: Secondary | ICD-10-CM

## 2021-03-28 DIAGNOSIS — L309 Dermatitis, unspecified: Secondary | ICD-10-CM | POA: Diagnosis not present

## 2021-03-28 LAB — CBC WITH DIFFERENTIAL/PLATELET
Absolute Monocytes: 965 cells/uL — ABNORMAL HIGH (ref 200–950)
Basophils Absolute: 54 cells/uL (ref 0–200)
Basophils Relative: 0.4 %
Eosinophils Absolute: 121 cells/uL (ref 15–500)
Eosinophils Relative: 0.9 %
HCT: 47.8 % — ABNORMAL HIGH (ref 35.0–45.0)
Hemoglobin: 16.5 g/dL — ABNORMAL HIGH (ref 11.7–15.5)
Lymphs Abs: 4596 cells/uL — ABNORMAL HIGH (ref 850–3900)
MCH: 29.5 pg (ref 27.0–33.0)
MCHC: 34.5 g/dL (ref 32.0–36.0)
MCV: 85.4 fL (ref 80.0–100.0)
MPV: 10.1 fL (ref 7.5–12.5)
Monocytes Relative: 7.2 %
Neutro Abs: 7665 cells/uL (ref 1500–7800)
Neutrophils Relative %: 57.2 %
Platelets: 332 10*3/uL (ref 140–400)
RBC: 5.6 10*6/uL — ABNORMAL HIGH (ref 3.80–5.10)
RDW: 13 % (ref 11.0–15.0)
Total Lymphocyte: 34.3 %
WBC: 13.4 10*3/uL — ABNORMAL HIGH (ref 3.8–10.8)

## 2021-03-28 MED ORDER — KETOCONAZOLE 2 % EX CREA: 1.0000 "application " | TOPICAL_CREAM | Freq: Every day | CUTANEOUS | 0 refills | Status: DC

## 2021-03-28 MED ORDER — DOXYCYCLINE HYCLATE 100 MG PO TABS: 100.0000 mg | ORAL_TABLET | Freq: Two times a day (BID) | ORAL | 0 refills | Status: DC

## 2021-03-28 NOTE — Progress Notes (Signed)
   Subjective:    Patient ID: Kristy Black, female    DOB: [birth year], 65 y.o.   MRN: [id number]  HPI 65 year old Female went to [place] some 3 or 4 weeks ago for vacation. Had some uncomfortable shoes and was walking a fair amount. Developed an ulceration dorsum ot left great toe. It has improved after changing shoes but not totally healed. No drainage from this ulceration.Subsequently with new shoes, developed erythema of foot  on dorsal aspect  with areas having red raised border suspicious for fungal infection. Patient has hx of peripheral vascular disease and is concerned about here foot from that aspect. CBc with diff drawn today and results are pending.Told her this looks more like dermatitis or fungal infection with superficial toe ulcer.  Patient has a history of smoking, essential hypertension, peripheral artery disease followed by vascular surgery.  History of GE reflux and constipation.  History of hyperlipidemia.    Review of Systems no fever or chills. No significant pain left foot- just irritated feeling     Objective:   Physical Exam  Blood pressure 140/80 pulse 72 temperature 98.8 degrees pulse oximetry 96% weight 157 pounds BMI 24.59  Left great toe has superficial ulceration mid proximal phalange that is not draining.  There is surrounding erythema of the great toe noted.  Able to bend toe.  There is erythema dorsum of left foot with a bordered area that is very distinct and erythematous and would suggest a fungal infection medial left foot and there is confluent erythema of the left foot with another area with a sharp border that would suggest a fungal infection.      Assessment & Plan:  Superficial ulceration left great toe  Dermatitis left foot-?  Fungal etiology  History of hypertension  History of peripheral vascular disease  History of smoking  History of hyperlipidemia  Plan: CBC with differential drawn and pending.  Doxycycline 100 mg twice daily for 7  days.  Nizoral cream to foot area daily.  Keep area clean and dry.  Suggest wearing a sock and a tennis shoe until area has improved and healed.  Follow-up August 1.

## 2021-03-28 NOTE — Telephone Encounter (Signed)
Kristy Black [phone]  [name] called to say she has a rash on her left foot for the last 2 weeks, would like to come in for you to look at it.

## 2021-03-28 NOTE — Telephone Encounter (Signed)
Scheduled at 4:30pm.

## 2021-03-28 NOTE — Patient Instructions (Addendum)
Nizoral cream to dermatitis on foot.  Keep superficial ulceration clean warm and dry.  Take doxycycline 100 mg twice daily for 7 days.  Worse thought content issue left foot until area has improved and healed.  Follow-up August 1.  CBC with differential drawn and pending.

## 2021-04-01 ENCOUNTER — Encounter: Payer: Self-pay | Admitting: Internal Medicine

## 2021-04-01 ENCOUNTER — Other Ambulatory Visit: Payer: Self-pay

## 2021-04-01 ENCOUNTER — Ambulatory Visit: Payer: 59 | Admitting: Internal Medicine

## 2021-04-01 VITALS — BP 130/80 | HR 64 | Temp 98.1°F | Ht 67.0 in | Wt 157.0 lb

## 2021-04-01 DIAGNOSIS — L309 Dermatitis, unspecified: Secondary | ICD-10-CM

## 2021-04-01 DIAGNOSIS — D72820 Lymphocytosis (symptomatic): Secondary | ICD-10-CM

## 2021-04-01 DIAGNOSIS — L97521 Non-pressure chronic ulcer of other part of left foot limited to breakdown of skin: Secondary | ICD-10-CM

## 2021-04-01 DIAGNOSIS — I708 Atherosclerosis of other arteries: Secondary | ICD-10-CM | POA: Diagnosis not present

## 2021-04-02 NOTE — Progress Notes (Signed)
   Subjective:    Patient ID: Kristy Black, female    DOB: May 10, 1956, 66 y.o.   MRN: [id number]  HPI 65 year old Female for follow up on left foot dermatitis. At last visit white blood cell count was 13,400.  Has been running white blood cell counts in the 12,000 range since 2019.  Her MCV is normal.  Absolute lymphocytes have been elevated at times.  Neutrophils have only been elevated once in 2020.  Patient is a smoker and hemoglobin is generally elevated due to that.  MCV has been normal.  It seemed that she likely had some type of fungal infection of her left foot and was treated with Nizoral cream and doxycycline for possible cellulitis.  The rash/irritation of the dorsum of her left foot has improved considerably. There was a toe ulcer left great toe that seems to be improving  Review of Systems no new complaints.     Objective:   Physical Exam  Blood pressure 130/80 pulse 64 temperature 98.1 degrees pulse oximetry 98% weight 157 pounds BMI 24.59.  Left great toe ulcer seems to be healing.  There is some shedding of the skin dorsum of left foot with less erythema.      Assessment & Plan:  Dermatitis left foot-improving with Nizoral cream  Small ulcer on left great toe noted to be improved  Plan: Finish course of doxycycline as directed to complete a 10-day course and continue using Nizoral cream on dorsum of foot.  Her white blood cell count is elevated at 13,400 and for the past few years has been in the 12,000 range but she also has high hemoglobin due to smoking.  It does not explain the high white blood cell count.  She does have peripheral vascular disease.  The last 2 CBCs have showed a lymphocytosis.    Addendum:I will have her return here in about 3 weeks for follow-up.  Would like to repeat CBC at that time- specifically August 23rd. I have called her on August 2nd to explain these results.

## 2021-04-02 NOTE — Patient Instructions (Signed)
Complete course of doxycycline and continue using Nizoral cream.  White blood cell count is elevated and we will need to follow-up on that.  Would like to see you again in 3 weeks for follow-up.

## 2021-04-23 ENCOUNTER — Other Ambulatory Visit: Payer: Self-pay

## 2021-04-23 ENCOUNTER — Other Ambulatory Visit: Payer: 59 | Admitting: Internal Medicine

## 2021-04-23 DIAGNOSIS — D72829 Elevated white blood cell count, unspecified: Secondary | ICD-10-CM

## 2021-04-24 LAB — PATHOLOGIST SMEAR REVIEW

## 2021-04-24 LAB — CBC WITH DIFFERENTIAL/PLATELET
Absolute Monocytes: 756 cells/uL (ref 200–950)
Basophils Absolute: 72 cells/uL (ref 0–200)
Basophils Relative: 0.6 %
Eosinophils Absolute: 108 cells/uL (ref 15–500)
Eosinophils Relative: 0.9 %
HCT: 47.1 % — ABNORMAL HIGH (ref 35.0–45.0)
Hemoglobin: 15.7 g/dL — ABNORMAL HIGH (ref 11.7–15.5)
Lymphs Abs: 4680 cells/uL — ABNORMAL HIGH (ref 850–3900)
MCH: 29 pg (ref 27.0–33.0)
MCHC: 33.3 g/dL (ref 32.0–36.0)
MCV: 86.9 fL (ref 80.0–100.0)
MPV: 10.1 fL (ref 7.5–12.5)
Monocytes Relative: 6.3 %
Neutro Abs: 6384 cells/uL (ref 1500–7800)
Neutrophils Relative %: 53.2 %
Platelets: 299 10*3/uL (ref 140–400)
RBC: 5.42 10*6/uL — ABNORMAL HIGH (ref 3.80–5.10)
RDW: 13.1 % (ref 11.0–15.0)
Total Lymphocyte: 39 %
WBC: 12 10*3/uL — ABNORMAL HIGH (ref 3.8–10.8)

## 2021-04-26 ENCOUNTER — Telehealth: Payer: Self-pay | Admitting: Hematology and Oncology

## 2021-04-26 NOTE — Telephone Encounter (Signed)
Received a new hem referral from Dr. Renold Genta for elevated lymphocytes. Kristy Black has been scheduled to see Dr. Chryl Heck on 8/29 at 11:20am. Pt aware to arrive 20 minutes early.

## 2021-04-29 ENCOUNTER — Inpatient Hospital Stay: Payer: 59

## 2021-04-29 ENCOUNTER — Other Ambulatory Visit: Payer: Self-pay

## 2021-04-29 ENCOUNTER — Encounter: Payer: Self-pay | Admitting: Hematology and Oncology

## 2021-04-29 ENCOUNTER — Inpatient Hospital Stay: Payer: 59 | Attending: Hematology and Oncology | Admitting: Hematology and Oncology

## 2021-04-29 ENCOUNTER — Telehealth: Payer: Self-pay | Admitting: Hematology and Oncology

## 2021-04-29 VITALS — BP 153/77 | HR 68 | Temp 98.3°F | Resp 18 | Ht 67.0 in | Wt 167.9 lb

## 2021-04-29 DIAGNOSIS — K5909 Other constipation: Secondary | ICD-10-CM | POA: Diagnosis not present

## 2021-04-29 DIAGNOSIS — D72829 Elevated white blood cell count, unspecified: Secondary | ICD-10-CM | POA: Insufficient documentation

## 2021-04-29 DIAGNOSIS — I10 Essential (primary) hypertension: Secondary | ICD-10-CM | POA: Diagnosis not present

## 2021-04-29 DIAGNOSIS — Z780 Asymptomatic menopausal state: Secondary | ICD-10-CM | POA: Diagnosis not present

## 2021-04-29 DIAGNOSIS — F17210 Nicotine dependence, cigarettes, uncomplicated: Secondary | ICD-10-CM | POA: Diagnosis not present

## 2021-04-29 DIAGNOSIS — D72820 Lymphocytosis (symptomatic): Secondary | ICD-10-CM

## 2021-04-29 DIAGNOSIS — D751 Secondary polycythemia: Secondary | ICD-10-CM | POA: Insufficient documentation

## 2021-04-29 DIAGNOSIS — E785 Hyperlipidemia, unspecified: Secondary | ICD-10-CM | POA: Diagnosis not present

## 2021-04-29 DIAGNOSIS — I739 Peripheral vascular disease, unspecified: Secondary | ICD-10-CM | POA: Diagnosis not present

## 2021-04-29 DIAGNOSIS — Z803 Family history of malignant neoplasm of breast: Secondary | ICD-10-CM | POA: Diagnosis not present

## 2021-04-29 LAB — CMP (CANCER CENTER ONLY)
ALT: 30 U/L (ref 0–44)
AST: 27 U/L (ref 15–41)
Albumin: 3.9 g/dL (ref 3.5–5.0)
Alkaline Phosphatase: 102 U/L (ref 38–126)
Anion gap: 12 (ref 5–15)
BUN: 8 mg/dL (ref 8–23)
CO2: 26 mmol/L (ref 22–32)
Calcium: 9.9 mg/dL (ref 8.9–10.3)
Chloride: 103 mmol/L (ref 98–111)
Creatinine: 0.69 mg/dL (ref 0.44–1.00)
GFR, Estimated: >60 mL/min (ref >60)
Glucose, Bld: 97 mg/dL (ref 70–99)
Potassium: 3.5 mmol/L (ref 3.5–5.1)
Sodium: 141 mmol/L (ref 135–145)
Total Bilirubin: 0.5 mg/dL (ref 0.3–1.2)
Total Protein: 7.5 g/dL (ref 6.5–8.1)

## 2021-04-29 LAB — CBC WITH DIFFERENTIAL/PLATELET
Abs Immature Granulocytes: 0.05 10*3/uL (ref 0.00–0.07)
Basophils Absolute: 0 10*3/uL (ref 0.0–0.1)
Basophils Relative: 0 %
Eosinophils Absolute: 0.2 10*3/uL (ref 0.0–0.5)
Eosinophils Relative: 1 %
HCT: 45.2 % (ref 36.0–46.0)
Hemoglobin: 15.3 g/dL — ABNORMAL HIGH (ref 12.0–15.0)
Immature Granulocytes: 0 %
Lymphocytes Relative: 35 %
Lymphs Abs: 4.3 10*3/uL — ABNORMAL HIGH (ref 0.7–4.0)
MCH: 29.1 pg (ref 26.0–34.0)
MCHC: 33.8 g/dL (ref 30.0–36.0)
MCV: 85.9 fL (ref 80.0–100.0)
Monocytes Absolute: 0.8 10*3/uL (ref 0.1–1.0)
Monocytes Relative: 7 %
Neutro Abs: 6.8 10*3/uL (ref 1.7–7.7)
Neutrophils Relative %: 57 %
Platelets: 270 10*3/uL (ref 150–400)
RBC: 5.26 MIL/uL — ABNORMAL HIGH (ref 3.87–5.11)
RDW: 14.4 % (ref 11.5–15.5)
WBC: 12.1 10*3/uL — ABNORMAL HIGH (ref 4.0–10.5)
nRBC: 0 % (ref 0.0–0.2)

## 2021-04-29 LAB — LACTATE DEHYDROGENASE: LDH: 163 U/L (ref 98–192)

## 2021-04-29 NOTE — Telephone Encounter (Signed)
Scheduled appointment per 08/29 sch msg. Patient is aware. 

## 2021-04-29 NOTE — Progress Notes (Signed)
[facility] CONSULT NOTE  Patient Care Team: Baxley, Cresenciano Lick, [name]  CHIEF COMPLAINTS/PURPOSE OF CONSULTATION:  Lymphocytosis  ASSESSMENT & PLAN:   This is a very pleasant 65 year old female patient with hypertension and dyslipidemia referred to hematology for evaluation of lymphocytosis.  Patient denies any new health complaints except for chronic constipation, hot flashes, some circulation issues in her leg possibly related to smoking.  Physical examination healthy-appearing female patient, no palpable lymphadenopathy or hepatosplenomegaly.  We have discussed the following details regarding lymphocytosis.  Lymphocytosis refers to an increase of peripheral blood lymphocytes, which for adults corresponds to >4000 lymphocytes/microL in most clinical laboratories. Common causes of lymphocytosis are viral illness, drug hypersensitivity reaction, stress, polyclonal B cell lymphocytosis in people who smoke, asplenia, and some clonal B cell process including lymphomas and leukemia. Initial evaluation usually consists of CBC, H and P, review of blood smear,occasionally flow cytometry. Today we have decided to proceed with evaluation as mentioned above.  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 She will return to clinic for video visit to review labs and to discuss any additional recommendations.  Age-appropriate cancer screening recommended.  #2.  Family history of breast cancer, sister had breast cancer at the age of 63.  We will send a referral to genetics.  Patient agreeable to this consult.  HISTORY OF PRESENTING ILLNESS:   Kristy Black 65 y.o. female is here because of lymphocytosis.  This is a very pleasant 65 year old female patient with past medical history significant for hypertension and dyslipidemia referred to hematology for evaluation of  lymphocytosis.  Patient arrived to the appointment today by herself.  She denies any new health complaints except for some chronic abnormal white blood cell count.  No B symptoms except for hot flashes which she attributes to menopause.  No fevers, drenching night sweats, appetite loss or weight loss. She has chronic constipation for which she uses MiraLAX on a.  Basis.  She also has intermittent claudication and some circulation issues likely related to her smoking.  She denies any changes in breathing, urinary habits.  No new neurological complaints.  No history of chronic viral infections such as hepatitis, known autoimmune diseases, new medications.  Rest of the pertinent 10 point ROS reviewed and negative.  REVIEW OF SYSTEMS:   Constitutional: Denies fevers, chills or abnormal night sweats Eyes: Denies blurriness of vision, double vision or watery eyes Ears, nose, mouth, throat, and face: Denies mucositis or sore throat Respiratory: Denies cough, dyspnea or wheezes Cardiovascular: Denies palpitation, chest discomfort or lower extremity swelling Gastrointestinal:  Denies nausea, heartburn or change in bowel habits Skin: Denies abnormal skin rashes Lymphatics: Denies new lymphadenopathy or easy bruising Neurological:Denies numbness, tingling or new weaknesses Behavioral/Psych: Mood is stable, no new changes  All other systems were reviewed with the patient and are negative.  MEDICAL HISTORY:  Past Medical History:  Diagnosis Date   Claudication of gluteal region [facility]) 01/18/2018   Elevated homocysteine    GERD (gastroesophageal reflux disease)    Hyperlipidemia    Hypertension     SURGICAL HISTORY: Past Surgical History:  Procedure Laterality Date   BREAST SURGERY     l breast biopsy   CESAREAN SECTION     TUBAL LIGATION      SOCIAL HISTORY: Social History   Socioeconomic History   Marital status: Married    Spouse name: Not  on file   Number of children: 1   Years of  education: some college   Highest education level: Not on file  Occupational History   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  Tobacco Use   Smoking status: Every Day    Packs/day: 0.50    Types: Cigarettes   Smokeless tobacco: Never   Tobacco comments:    10 cigarettes/day  Vaping Use   Vaping Use: Never used  Substance and Sexual Activity   Alcohol use: Yes    Comment: socially   Drug use: No   Sexual activity: Not Currently    Birth control/protection: Post-menopausal  Other Topics Concern   Not on file  Social History Narrative   Lives  W/ husband   Caffeine use: 1x/week coffee   Daily tea   Right handed    Social Determinants of Health   Financial Resource Strain: Not on file  Food Insecurity: Not on file  Transportation Needs: Not on file  Physical Activity: Not on file  Stress: Not on file  Social Connections: Not on file  Intimate Partner Violence: Not on file    FAMILY HISTORY: Family History  Problem Relation Age of Onset   Cancer Sister    Breast cancer Sister    Breast cancer Paternal Aunt     ALLERGIES:  is allergic to penicillins.  MEDICATIONS:  Current Outpatient Medications  Medication Sig Dispense Refill   Cholecalciferol (VITAMIN D3) 2000 units TABS Take 1 tablet by mouth daily.     doxycycline (VIBRA-TABS) 100 MG tablet Take 1 tablet (100 mg total) by mouth 2 (two) times daily. 14 tablet 0   hydrochlorothiazide (HYDRODIURIL) 25 MG tablet Take 1 tablet by mouth once daily 90 tablet 3   ibuprofen (ADVIL,MOTRIN) 200 MG tablet Take 200 mg by mouth every 6 (six) hours as needed.     ketoconazole (NIZORAL) 2 % cream Apply 1 application topically daily. 15 g 0   metoprolol succinate (TOPROL-XL) 50 MG 24 hr tablet Take 1 tablet by mouth once daily 90 tablet 3   pramoxine-hydrocortisone (PROCTOCREAM-HC) 1-1 % rectal cream Place 1 application rectally 2 (two) times daily. 30 g prn   rosuvastatin (CRESTOR) 20 MG tablet Take 1 tablet by mouth once daily 90 tablet  3   No current facility-administered medications for this visit.    PHYSICAL EXAMINATION: ECOG PERFORMANCE STATUS: 0 - Asymptomatic  Vitals:   04/29/21 1125  BP: (!) 153/77  Pulse: 68  Resp: 18  Temp: 98.3 F (36.8 C)  SpO2: 97%   Filed Weights   04/29/21 1125  Weight: 167 lb 14.4 oz (76.2 kg)    GENERAL:alert, no distress and comfortable SKIN: skin color, texture, turgor are normal, no rashes or significant lesions EYES: normal, conjunctiva are pink and non-injected, sclera clear OROPHARYNX:no exudate, no erythema and lips, buccal mucosa, and tongue normal  NECK: supple, thyroid normal size, non-tender, without nodularity LYMPH:  no palpable lymphadenopathy in the cervical, axillary or inguinal LUNGS: clear to auscultation and percussion with normal breathing effort HEART: regular rate & rhythm and no murmurs and no lower extremity edema ABDOMEN:abdomen soft, non-tender and normal bowel sounds Musculoskeletal:no cyanosis of digits and no clubbing  PSYCH: alert & oriented x 3 with fluent speech NEURO: no focal motor/sensory deficits  LABORATORY DATA:  I have reviewed the data as listed Lab Results  Component Value Date   WBC 12.0 (H) 04/23/2021   HGB 15.7 (H) 04/23/2021   HCT 47.1 (H) 04/23/2021   MCV 86.9  04/23/2021   PLT 299 04/23/2021     Chemistry      Component Value Date/Time   NA 138 06/14/2020 0917   K 3.8 06/14/2020 0917   CL 98 06/14/2020 0917   CO2 30 06/14/2020 0917   BUN 5 (L) 06/14/2020 0917   CREATININE 0.66 06/14/2020 0917      Component Value Date/Time   CALCIUM 9.7 06/14/2020 0917   ALKPHOS 90 01/19/2017 1014   AST 25 06/14/2020 0917   ALT 29 06/14/2020 0917   BILITOT 0.6 06/14/2020 0917     Have reviewed pertinent CBCs for the past 2 years.  She has had mild leukocytosis with lymphocytosis and slight polycythemia.   RADIOGRAPHIC STUDIES: I have personally reviewed the radiological images as listed and agreed with the findings in the  report. No results found.  All questions were answered. The patient knows to call the clinic with any problems, questions or concerns. I spent 45 minutes in the care of this patient including H and P, review of records, counseling and coordination of care.     Benay Pike, MD 04/29/2021 11:26 AM

## 2021-05-01 LAB — SURGICAL PATHOLOGY

## 2021-05-02 LAB — FLOW CYTOMETRY

## 2021-05-16 ENCOUNTER — Other Ambulatory Visit: Payer: Self-pay

## 2021-05-16 ENCOUNTER — Inpatient Hospital Stay: Payer: 59 | Attending: Hematology and Oncology | Admitting: Genetic Counselor

## 2021-05-16 DIAGNOSIS — Z803 Family history of malignant neoplasm of breast: Secondary | ICD-10-CM

## 2021-05-16 DIAGNOSIS — Z800 Family history of malignant neoplasm of digestive organs: Secondary | ICD-10-CM

## 2021-05-16 DIAGNOSIS — D72820 Lymphocytosis (symptomatic): Secondary | ICD-10-CM | POA: Insufficient documentation

## 2021-05-20 ENCOUNTER — Encounter: Payer: Self-pay | Admitting: Hematology and Oncology

## 2021-05-20 ENCOUNTER — Encounter: Payer: Self-pay | Admitting: Genetic Counselor

## 2021-05-20 ENCOUNTER — Inpatient Hospital Stay (HOSPITAL_BASED_OUTPATIENT_CLINIC_OR_DEPARTMENT_OTHER): Payer: 59 | Admitting: Hematology and Oncology

## 2021-05-20 DIAGNOSIS — Z800 Family history of malignant neoplasm of digestive organs: Secondary | ICD-10-CM

## 2021-05-20 DIAGNOSIS — D72820 Lymphocytosis (symptomatic): Secondary | ICD-10-CM | POA: Diagnosis not present

## 2021-05-20 DIAGNOSIS — Z803 Family history of malignant neoplasm of breast: Secondary | ICD-10-CM

## 2021-05-20 HISTORY — DX: Family history of malignant neoplasm of digestive organs: Z80.0

## 2021-05-20 HISTORY — DX: Family history of malignant neoplasm of breast: Z80.3

## 2021-05-20 NOTE — Progress Notes (Signed)
[facility] CONSULT NOTE  Patient Care Team: Baxley, Cresenciano Lick, [name]  CHIEF COMPLAINTS/PURPOSE OF CONSULTATION:  Lymphocytosis  ASSESSMENT & PLAN:   This is a very pleasant 65 year old female patient with hypertension and dyslipidemia referred to hematology for evaluation of lymphocytosis.  Patient denies any new health complaints except for chronic constipation, hot flashes, some circulation issues in her leg possibly related to smoking.  During her initial visit, she denied any new health complaints except for intermittent claudication and chronic constipation.  Since last visit there are no new health complaints.   She is here for a telehealth visit to review labs.  Labs did not show any presence of monoclonal lymphocytosis.  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  Lactate dehydrogenase is normal.  She continues to feel at baseline. Physical examination deferred today in lieu of video visit. Have recommended that she continue follow-up with hematology on a periodic basis and report to [place] any new B symptoms, recurrent infections, left upper quadrant abdominal pain, early satiety, bloating etc. She will return to clinic in 6 months. #2.  Family history of breast cancer, sister had breast cancer at the age of 22.  Referral to genetics was sent.  HISTORY OF PRESENTING ILLNESS:   Kristy Black 65 y.o. female is here because of lymphocytosis.  This is a very pleasant 65 year old female patient with past medical history significant for hypertension and dyslipidemia referred to hematology for evaluation of lymphocytosis.  She is here for a telehealth visit.  She has been doing quite well since her last visit here, no new complaints.  No interim infections or change in medications. Rest of the pertinent 10 point ROS  reviewed and negative.  REVIEW OF SYSTEMS:   Constitutional: Denies fevers, chills or abnormal night sweats Eyes: Denies blurriness of vision, double vision or watery eyes Ears, nose, mouth, throat, and face: Denies mucositis or sore throat Respiratory: Denies cough, dyspnea or wheezes Cardiovascular: Denies palpitation, chest discomfort or lower extremity swelling Gastrointestinal:  Denies nausea, heartburn or change in bowel habits Skin: Denies abnormal skin rashes Lymphatics: Denies new lymphadenopathy or easy bruising Neurological:Denies numbness, tingling or new weaknesses Behavioral/Psych: Mood is stable, no new changes  All other systems were reviewed with the patient and are negative.  MEDICAL HISTORY:  Past Medical History:  Diagnosis Date   Claudication of gluteal region [facility]) 01/18/2018   Elevated homocysteine    Family history of breast cancer 05/20/2021   Family history of pancreatic cancer 05/20/2021   GERD (gastroesophageal reflux disease)    Hyperlipidemia    Hypertension     SURGICAL HISTORY: Past Surgical History:  Procedure Laterality Date   BREAST SURGERY     l breast biopsy   CESAREAN SECTION     TUBAL LIGATION      SOCIAL HISTORY: Social History   Socioeconomic History   Marital status: Married    Spouse name: Not on file   Number of children: 1   Years of education: some college   Highest education level: Not on file  Occupational History   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  Tobacco Use   Smoking status: Every Day    Packs/day: 0.50    Types: Cigarettes   Smokeless tobacco: Never   Tobacco comments:    10 cigarettes/day  Vaping Use   Vaping Use: Never used  Substance  and Sexual Activity   Alcohol use: Yes    Comment: socially   Drug use: No   Sexual activity: Not Currently    Birth control/protection: Post-menopausal  Other Topics Concern   Not on file  Social History Narrative   Lives  W/ husband   Caffeine use: 1x/week coffee   Daily  tea   Right handed    Social Determinants of Health   Financial Resource Strain: Not on file  Food Insecurity: Not on file  Transportation Needs: Not on file  Physical Activity: Not on file  Stress: Not on file  Social Connections: Not on file  Intimate Partner Violence: Not on file    FAMILY HISTORY: Family History  Problem Relation Age of Onset   Breast cancer Sister 48   Leukemia Sister 70   Lung cancer Maternal Uncle        dx after 74; no smoking hx   Breast cancer Paternal Aunt 60       x2 paternal aunts   Pancreatic cancer Paternal Uncle        d. 10   Cancer Paternal Uncle        unknown type; dx ~50    ALLERGIES:  is allergic to penicillins.  MEDICATIONS:  Current Outpatient Medications  Medication Sig Dispense Refill   Cholecalciferol (VITAMIN D3) 2000 units TABS Take 1 tablet by mouth daily.     hydrochlorothiazide (HYDRODIURIL) 25 MG tablet Take 1 tablet by mouth once daily 90 tablet 3   ibuprofen (ADVIL,MOTRIN) 200 MG tablet Take 200 mg by mouth every 6 (six) hours as needed.     ketoconazole (NIZORAL) 2 % cream Apply 1 application topically daily. 15 g 0   Magnesium 100 MG CAPS Take by mouth.     metoprolol succinate (TOPROL-XL) 50 MG 24 hr tablet Take 1 tablet by mouth once daily 90 tablet 3   Polyethylene Glycol 3350 (MIRALAX PO) Take by mouth daily.     pramoxine-hydrocortisone (PROCTOCREAM-HC) 1-1 % rectal cream Place 1 application rectally 2 (two) times daily. 30 g prn   rosuvastatin (CRESTOR) 20 MG tablet Take 1 tablet by mouth once daily 90 tablet 3   Zinc 30 MG CAPS Take 30 mg by mouth daily.     No current facility-administered medications for this visit.    PHYSICAL EXAMINATION: ECOG PERFORMANCE STATUS: 0 - Asymptomatic  Vital signs and physical exam deferred in lieu of counseling.  LABORATORY DATA:  I have reviewed the data as listed Lab Results  Component Value Date   WBC 12.1 (H) 04/29/2021   HGB 15.3 (H) 04/29/2021   HCT 45.2  04/29/2021   MCV 85.9 04/29/2021   PLT 270 04/29/2021     Chemistry      Component Value Date/Time   NA 141 04/29/2021 1204   K 3.5 04/29/2021 1204   CL 103 04/29/2021 1204   CO2 26 04/29/2021 1204   BUN 8 04/29/2021 1204   CREATININE 0.69 04/29/2021 1204   CREATININE 0.66 06/14/2020 0917      Component Value Date/Time   CALCIUM 9.9 04/29/2021 1204   ALKPHOS 102 04/29/2021 1204   AST 27 04/29/2021 1204   ALT 30 04/29/2021 1204   BILITOT 0.5 04/29/2021 1204     I have reviewed pertinent CBCs for the past 2 years.  She has had mild leukocytosis with lymphocytosis for a while.  RADIOGRAPHIC STUDIES: I have personally reviewed the radiological images as listed and agreed with the findings in the  report. No results found.  All questions were answered. The patient knows to call the clinic with any problems, questions or concerns. I spent 15 minutes in the care of this patient including history, review of records, counseling about symptoms which may require sooner visit, follow-up recommendations   I connected with  Kristy Black on 05/20/21 by a video enabled telemedicine application and verified that I am speaking with the correct person using two identifiers.   I discussed the limitations of evaluation and management by telemedicine. The patient expressed understanding and agreed to proceed.     Benay Pike, MD 05/20/2021 9:39 AM

## 2021-05-20 NOTE — Progress Notes (Signed)
REFERRING PROVIDER: Benay Pike, MD [place],  [place] [address]  PRIMARY PROVIDER:  Elby Showers, MD  PRIMARY REASON FOR VISIT:  1. Family history of breast cancer   2. Family history of pancreatic cancer     HISTORY OF PRESENT ILLNESS:   [name], a 65 y.o. female, was seen for a [facility] cancer genetics consultation at the request of Dr. Chryl Heck due to a family history of cancer.  [name] presents to clinic today to discuss the possibility of a hereditary predisposition to cancer, to discuss genetic testing, and to further clarify her future cancer risks, as well as potential cancer risks for family members.   [name] is a 65 y.o. female with no personal history of cancer.    CANCER HISTORY:  Oncology History   No history exists.    RISK FACTORS:  Menarche was at age 89.  First live birth at age 71.  OCP use for approximately  15  years.  Ovaries intact: yes.  Hysterectomy: no.  Menopausal status: postmenopausal.  HRT use: 0 years. Colonoscopy: no; not examined; negative Cologuard in 2021 Mammogram within the last year: yes. Number of breast biopsies: at least two (L complex sclerosing lesion in 2007; R papilloma in 2002) Up to date with pelvic exams: yes. Any excessive radiation exposure in the past: no  Past Medical History:  Diagnosis Date   Claudication of gluteal region ([facility]) 01/18/2018   Elevated homocysteine    Family history of breast cancer 05/20/2021   Family history of pancreatic cancer 05/20/2021   GERD (gastroesophageal reflux disease)    Hyperlipidemia    Hypertension     Past Surgical History:  Procedure Laterality Date   BREAST SURGERY     l breast biopsy   CESAREAN SECTION     TUBAL LIGATION      Social History   Socioeconomic History   Marital status: Married    Spouse name: Not on file   Number of children: 1   Years of education: some college   Highest education level: Not on file  Occupational History    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  Tobacco Use   Smoking status: Every Day    Packs/day: 0.50    Types: Cigarettes   Smokeless tobacco: Never   Tobacco comments:    10 cigarettes/day  Vaping Use   Vaping Use: Never used  Substance and Sexual Activity   Alcohol use: Yes    Comment: socially   Drug use: No   Sexual activity: Not Currently    Birth control/protection: Post-menopausal  Other Topics Concern   Not on file  Social History Narrative   Lives  W/ husband   Caffeine use: 1x/week coffee   Daily tea   Right handed    Social Determinants of Health   Financial Resource Strain: Not on file  Food Insecurity: Not on file  Transportation Needs: Not on file  Physical Activity: Not on file  Stress: Not on file  Social Connections: Not on file     FAMILY HISTORY:  We obtained a detailed, 4-generation family history.  Significant diagnoses are listed below: Family History  Problem Relation Age of Onset   Breast cancer Sister 16   Leukemia Sister 43   Lung cancer Maternal Uncle        dx after 72; no smoking hx   Breast cancer Paternal Aunt 60       x2 paternal aunts   Pancreatic cancer Paternal  Uncle        d. 39   Cancer Paternal Uncle        unknown type; dx ~50    Ms. Sigel is unaware of previous family history of genetic testing for hereditary cancer risks. There is no reported Ashkenazi Jewish ancestry. There is no known consanguinity.  GENETIC COUNSELING ASSESSMENT: [name] is a 65 y.o. female with a family history of cancer which is somewhat suggestive of a hereditary cancer syndrome and predisposition to cancer given the presence of related cancers in the family and ages at diagnosis. We, therefore, discussed and recommended the following at today's visit.   DISCUSSION: We discussed that 5 - 10% of cancer is hereditary, with most cases of hereditary breast cancer associated with mutations in BRCA1/2.  There are other genes that can be associated with hereditary  breast cancer syndromes.  Type of cancer risk and level of risk are gene-specific.  We discussed that testing is beneficial for several reasons, including knowing about other cancer risks, identifying potential screening and risk-reduction options that may be appropriate, and to understanding if other family members could be at risk for cancer and allowing them to undergo genetic testing.  We reviewed the characteristics, features and inheritance patterns of hereditary cancer syndromes. We also discussed genetic testing, including the appropriate family members to test, the process of testing, insurance coverage and turn-around-time for results. We discussed the implications of a negative, positive, carrier and/or variant of uncertain significant result. We discussed that negative results would be uninformative given that [name] does not have a personal history of cancer. We recommended [name] pursue genetic testing for a panel that contains genes associated with breast and pancreatic.  Based on [name]'s family history of cancer, she meets medical criteria for genetic testing. Despite that she meets criteria, she may still have an out of pocket cost.     We discussed that some people do not want to undergo genetic testing due to fear of genetic discrimination.  A federal law called the Genetic Information Non-Discrimination Act (GINA) of 2008 helps protect individuals against genetic discrimination based on their genetic test results.  It impacts both health insurance and employment.  With health insurance, it protects against increased premiums, being kicked off insurance or being forced to take a test in order to be insured.  For employment it protects against hiring, firing and promoting decisions based on genetic test results.  GINA does not apply to those in the [employer], those who work for companies with less than 15 employees, and new life insurance or long-term disability insurance  policies.  Health status due to a cancer diagnosis is not protected under GINA.  Based on the patient's family history, a statistical model [employer]) was used to estimate her risk of developing breast cancer. This estimates her lifetime risk of developing breast cancer to be approximately 38%. This estimation does not consider any genetic testing results.  The patient's lifetime breast cancer risk is a preliminary estimate based on available information using one of several models endorsed by the [employer] (ACS). The ACS recommends consideration of breast MRI screening as an adjunct to mammography for patients at high risk (defined as 20% or greater lifetime risk).  This risk estimate can change over time and may be repeated to reflect new information in her personal or family history in the future.   Ms. Tusing has been determined to be at high risk for breast cancer.  Therefore, we recommend that annual screening with mammography and breast MRI be performed.  We discussed that Ms. Cozart should discuss her individual situation with her referring physician and determine a breast cancer screening plan with which they are both comfortable.    PLAN: Ms. Mcinnis did not wish to pursue genetic testing at today's visit. We understand this decision and remain available to coordinate genetic testing at any time in the future. We, therefore, recommend Ms. Rise consider breast MRIs based on her estimated lifetime risk for breast cancer and continue to follow the cancer screening guidelines given by her primary healthcare and referring provider.  Based on Ms. Cupples's family history of breast cancer, we recommended first degree relatives of her sister who had a breast cancer history have genetic counseling and testing. Ms. Revels will let us know if we can be of any assistance in coordinating genetic counseling and/or testing for these family members.   Lastly, we encouraged Ms. Wieser to remain  in contact with cancer genetics annually so that we can continuously update the family history and inform her of any changes in cancer genetics and testing that may be of benefit for this family.   Ms. Chaney questions were answered to her satisfaction today. Our contact information was provided should additional questions or concerns arise. Thank you for the referral and allowing [place] to share in the care of your patient.   Leoncio Hansen M. Joette Catching, [place], [facility] Genetic Counselor [email or web address] (P) [phone]   The patient was seen for a total of 40 minutes in face-to-face genetic counseling.  The patient was seen alone.  Drs. Magrinat, Lindi Adie and/or Burr Medico were available to discuss this case as needed.  _______________________________________________________________________ For Office Staff:  Number of people involved in session: 1 Was an Intern/ student involved with case: no

## 2021-05-23 ENCOUNTER — Telehealth: Payer: Self-pay | Admitting: Hematology and Oncology

## 2021-05-23 ENCOUNTER — Other Ambulatory Visit: Payer: Self-pay | Admitting: Hematology and Oncology

## 2021-05-23 NOTE — Telephone Encounter (Signed)
Scheduled appt per 9/21 sch msg. Called pt, no answer. Left msg with appt date and time.

## 2021-05-23 NOTE — Progress Notes (Signed)
I called her, discussed about role of MRI screening in breast cancer given her life time risk of 38%, She wants to think about it She will come see [place] in one week for FU to discuss more and we will also try to discuss about endocrine prevention at the same time.  Kristy Black

## 2021-05-30 ENCOUNTER — Other Ambulatory Visit: Payer: Self-pay

## 2021-05-30 ENCOUNTER — Encounter: Payer: Self-pay | Admitting: Hematology and Oncology

## 2021-05-30 ENCOUNTER — Inpatient Hospital Stay: Payer: 59 | Admitting: Hematology and Oncology

## 2021-05-30 VITALS — BP 146/62 | HR 69 | Temp 96.2°F | Resp 17 | Wt 168.5 lb

## 2021-05-30 DIAGNOSIS — D72820 Lymphocytosis (symptomatic): Secondary | ICD-10-CM | POA: Diagnosis present

## 2021-05-30 DIAGNOSIS — Z803 Family history of malignant neoplasm of breast: Secondary | ICD-10-CM

## 2021-05-30 NOTE — Progress Notes (Signed)
[place] CONSULT NOTE  Patient Care Team: Baxley, Cresenciano Lick, [name]  CHIEF COMPLAINTS/PURPOSE OF CONSULTATION:  Lymphocytosis  ASSESSMENT & PLAN:   This is a very pleasant 65 year old female patient with hypertension and dyslipidemia referred to hematology for evaluation of lymphocytosis.  Labs did not show any presence of monoclonal lymphocytosis.  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  She is hence here for follow-up to discuss about additional measures to screen for breast cancer and other options for treatment.  We discussed her life time risk of breast cancer and 5 yr risk of breast cancer today is 38% and 4.6% respectively.  2. We discussed different risk reducing strategies for future breast cancer risk. We discussed chemoprevention and lifestyle modification. We discussed role of ongoing surveillance with mammograms versus MRIs. Patient would be a good candidate for chemoprevention with  tamoxifen or Evista or aromatase inhibitors. We discussed different medications available. We discussed their particular side effects. With tamoxifen side effects would include but not limited to cataracts, increased risk of DVT/PE/cardiovascular events, uterine malignancy, hot flashes, and mood swings. With Evista patient would and could experience mood swings, hot flashes ,aches and pains , and possibly thrombosis. Aromatase inhibitors side effects also include but not limiting to bone loss, aches and pains, hyperlipidemia, hot flashes and mood swings.  Patient felt more comfortable with starting Arimidex however wants to review some more literature about it.   She was instructed to give [place] a call and let us know about her decision.  We have discussed that chemoprevention does not necessarily prolong survival but can reduce risk of hormone dependent breast cancers.  3. Lifestyle modification: We discussed different interventions exercise at least 5 days a week including both aerobic as well as weight-bearing exercises and strength training. He discussed dietary modification including increasing number of servings of fruit and vegetables as well as decreased in number of servings of meat. We discussed the importance of maintaining a good BMI and limiting alcohol intake.  4. Surveillance: Patient certainly would be a good candidate for ongoing mammograms on a yearly basis. Certainly she could benefit from a 3-D mammogram. We discussed self breast examination as well as ongoing clinical examination.  We have discussed about annual MRIs but for now she would like to defer it since it costs her about $500 for every MRI.  She would like to proceed with mammogram in March and she would like to think after that.  She usually gets mammograms ordered by her primary care physician.  5. Chemoprevention: As discussed above  6. Genetics: Genetic testing , she declined.  7. Followup: Patient will be seen back in 6 months time in followup.  If she decides to proceed with Arimidex, she was however asked to call us immediately so we can get this prescription going.  HISTORY OF PRESENTING ILLNESS:   Kristy Black 65 y.o. female is here because of lymphocytosis.  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  Interval history Since last visit, she denies any new health complaints.  She has been working  on weight loss, changed her diet and lost about 10 pounds.  She does self breast exam and has not noticed any changes in her breast. Rest of the pertinent 10 point ROS reviewed and negative.  MEDICAL HISTORY:  Past Medical History:  Diagnosis Date   Claudication of gluteal region [facility]) 01/18/2018   Elevated homocysteine    Family history of breast cancer 05/20/2021   Family history of pancreatic cancer 05/20/2021   GERD (gastroesophageal reflux disease)    Hyperlipidemia    Hypertension     SURGICAL HISTORY: Past Surgical History:  Procedure Laterality Date   BREAST SURGERY     l breast biopsy   CESAREAN SECTION     TUBAL LIGATION      SOCIAL HISTORY: Social History   Socioeconomic History   Marital status: Married    Spouse name: Not on file   Number of children: 1   Years of education: some college   Highest education level: Not on file  Occupational History   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  Tobacco Use   Smoking status: Every Day    Packs/day: 0.50    Types: Cigarettes   Smokeless tobacco: Never   Tobacco comments:    10 cigarettes/day  Vaping Use   Vaping Use: Never used  Substance and Sexual Activity   Alcohol use: Yes    Comment: socially   Drug use: No   Sexual activity: Not Currently    Birth control/protection: Post-menopausal  Other Topics Concern   Not on file  Social History Narrative   Lives  W/ husband   Caffeine use: 1x/week coffee   Daily tea   Right handed    Social Determinants of Health   Financial Resource Strain: Not on file  Food Insecurity: Not on file  Transportation Needs: Not on file  Physical Activity: Not on file  Stress: Not on file  Social Connections: Not on file  Intimate Partner Violence: Not on file    FAMILY HISTORY: Family History  Problem Relation Age of Onset   Breast cancer Sister 37   Leukemia Sister 52   Lung cancer Maternal Uncle        dx after 37; no smoking hx   Breast cancer Paternal Aunt 60       x2  paternal aunts   Pancreatic cancer Paternal Uncle        d. 69   Cancer Paternal Uncle        unknown type; dx ~50    ALLERGIES:  is allergic to penicillins.  MEDICATIONS:  Current Outpatient Medications  Medication Sig Dispense Refill   Cholecalciferol (VITAMIN D3) 2000 units TABS Take 1 tablet by mouth daily.     hydrochlorothiazide (HYDRODIURIL) 25 MG tablet Take 1 tablet by mouth once daily 90 tablet 3   ibuprofen (ADVIL,MOTRIN) 200 MG tablet Take 200 mg by mouth every 6 (six) hours as needed.     ketoconazole (NIZORAL) 2 % cream Apply 1 application topically daily. 15 g 0   Magnesium 100 MG CAPS Take by mouth.     metoprolol succinate (TOPROL-XL) 50 MG 24 hr tablet Take 1 tablet by mouth once daily 90 tablet 3   Polyethylene Glycol 3350 (MIRALAX PO) Take by mouth daily.     pramoxine-hydrocortisone (PROCTOCREAM-HC) 1-1 % rectal cream Place 1 application rectally 2 (two) times daily. 30 g  prn   rosuvastatin (CRESTOR) 20 MG tablet Take 1 tablet by mouth once daily 90 tablet 3   Zinc 30 MG CAPS Take 30 mg by mouth daily.     No current facility-administered medications for this visit.    PHYSICAL EXAMINATION:  BP (!) 146/62 (BP Location: Left Arm, Patient Position: Sitting)   Pulse 69   Temp (!) 96.2 F (35.7 C) (Tympanic)   Resp 17   Wt 168 lb 8 oz (76.4 kg)   SpO2 97%   BMI 26.39 kg/m    ECOG PERFORMANCE STATUS: 0 - Asymptomatic General appearance: Alert oriented and in no acute distress Breast: Bilateral breasts inspected, no palpable changes.  No regional lymphadenopathy or nipple discharge. Lower extremities: No bilateral lower extremity edema.  LABORATORY DATA:  I have reviewed the data as listed Lab Results  Component Value Date   WBC 12.1 (H) 04/29/2021   HGB 15.3 (H) 04/29/2021   HCT 45.2 04/29/2021   MCV 85.9 04/29/2021   PLT 270 04/29/2021     Chemistry      Component Value Date/Time   NA 141 04/29/2021 1204   K 3.5 04/29/2021 1204   CL 103  04/29/2021 1204   CO2 26 04/29/2021 1204   BUN 8 04/29/2021 1204   CREATININE 0.69 04/29/2021 1204   CREATININE 0.66 06/14/2020 0917      Component Value Date/Time   CALCIUM 9.9 04/29/2021 1204   ALKPHOS 102 04/29/2021 1204   AST 27 04/29/2021 1204   ALT 30 04/29/2021 1204   BILITOT 0.5 04/29/2021 1204     I have reviewed pertinent CBCs for the past 2 years.  She has had mild leukocytosis with lymphocytosis for a while.  I spent 30 minutes or greater in the care of this patient including history and physical, review of records, counseling and coordination of care.  We have discussed about MRI surveillance, role of tamoxifen versus aromatase inhibitors in preventing breast cancer, risk of breast cancer during her lifetime versus 5-year risk and lifestyle modifications.  RADIOGRAPHIC STUDIES: I have personally reviewed the radiological images as listed and agreed with the findings in the report. No results found.      Benay Pike, MD 05/30/2021 9:08 AM

## 2021-06-20 ENCOUNTER — Other Ambulatory Visit: Payer: Self-pay

## 2021-06-20 ENCOUNTER — Other Ambulatory Visit: Payer: 59 | Admitting: Internal Medicine

## 2021-06-20 DIAGNOSIS — Z1329 Encounter for screening for other suspected endocrine disorder: Secondary | ICD-10-CM

## 2021-06-20 DIAGNOSIS — I10 Essential (primary) hypertension: Secondary | ICD-10-CM

## 2021-06-20 DIAGNOSIS — Z0000 Encounter for general adult medical examination without abnormal findings: Secondary | ICD-10-CM

## 2021-06-20 DIAGNOSIS — E782 Mixed hyperlipidemia: Secondary | ICD-10-CM

## 2021-06-21 LAB — COMPLETE METABOLIC PANEL WITH GFR
AG Ratio: 1.6 (calc) (ref 1.0–2.5)
ALT: 15 U/L (ref 6–29)
AST: 17 U/L (ref 10–35)
Albumin: 4.2 g/dL (ref 3.6–5.1)
Alkaline phosphatase (APISO): 85 U/L (ref 37–153)
BUN: 9 mg/dL (ref 7–25)
CO2: 31 mmol/L (ref 20–32)
Calcium: 10 mg/dL (ref 8.6–10.4)
Chloride: 101 mmol/L (ref 98–110)
Creat: 0.69 mg/dL (ref 0.50–1.05)
Globulin: 2.6 g/dL (calc) (ref 1.9–3.7)
Glucose, Bld: 99 mg/dL (ref 65–99)
Potassium: 4.2 mmol/L (ref 3.5–5.3)
Sodium: 141 mmol/L (ref 135–146)
Total Bilirubin: 0.6 mg/dL (ref 0.2–1.2)
Total Protein: 6.8 g/dL (ref 6.1–8.1)
eGFR: 98 mL/min/{1.73_m2} (ref >=60)

## 2021-06-21 LAB — CBC WITH DIFFERENTIAL/PLATELET
Absolute Monocytes: 905 cells/uL (ref 200–950)
Basophils Absolute: 55 cells/uL (ref 0–200)
Basophils Relative: 0.5 %
Eosinophils Absolute: 131 cells/uL (ref 15–500)
Eosinophils Relative: 1.2 %
HCT: 46.7 % — ABNORMAL HIGH (ref 35.0–45.0)
Hemoglobin: 15.6 g/dL — ABNORMAL HIGH (ref 11.7–15.5)
Lymphs Abs: 3488 cells/uL (ref 850–3900)
MCH: 29 pg (ref 27.0–33.0)
MCHC: 33.4 g/dL (ref 32.0–36.0)
MCV: 86.8 fL (ref 80.0–100.0)
MPV: 10 fL (ref 7.5–12.5)
Monocytes Relative: 8.3 %
Neutro Abs: 6322 cells/uL (ref 1500–7800)
Neutrophils Relative %: 58 %
Platelets: 301 10*3/uL (ref 140–400)
RBC: 5.38 10*6/uL — ABNORMAL HIGH (ref 3.80–5.10)
RDW: 13.2 % (ref 11.0–15.0)
Total Lymphocyte: 32 %
WBC: 10.9 10*3/uL — ABNORMAL HIGH (ref 3.8–10.8)

## 2021-06-21 LAB — TSH: TSH: 2.23 mIU/L (ref 0.40–4.50)

## 2021-06-21 LAB — LIPID PANEL
Cholesterol: 107 mg/dL (ref <200)
HDL: 37 mg/dL — ABNORMAL LOW (ref >=50)
LDL Cholesterol (Calc): 48 mg/dL (calc)
Non-HDL Cholesterol (Calc): 70 mg/dL (calc) (ref <130)
Total CHOL/HDL Ratio: 2.9 (calc) (ref <5.0)
Triglycerides: 137 mg/dL (ref <150)

## 2021-06-24 ENCOUNTER — Ambulatory Visit (INDEPENDENT_AMBULATORY_CARE_PROVIDER_SITE_OTHER): Payer: 59 | Admitting: Internal Medicine

## 2021-06-24 ENCOUNTER — Encounter: Payer: Self-pay | Admitting: Internal Medicine

## 2021-06-24 ENCOUNTER — Other Ambulatory Visit: Payer: Self-pay

## 2021-06-24 VITALS — BP 116/64 | HR 71 | Temp 98.5°F | Ht 67.0 in | Wt 167.0 lb

## 2021-06-24 DIAGNOSIS — Z0000 Encounter for general adult medical examination without abnormal findings: Secondary | ICD-10-CM

## 2021-06-24 DIAGNOSIS — E786 Lipoprotein deficiency: Secondary | ICD-10-CM

## 2021-06-24 DIAGNOSIS — F17200 Nicotine dependence, unspecified, uncomplicated: Secondary | ICD-10-CM | POA: Diagnosis not present

## 2021-06-24 DIAGNOSIS — I700 Atherosclerosis of aorta: Secondary | ICD-10-CM

## 2021-06-24 DIAGNOSIS — K5904 Chronic idiopathic constipation: Secondary | ICD-10-CM

## 2021-06-24 DIAGNOSIS — F419 Anxiety disorder, unspecified: Secondary | ICD-10-CM

## 2021-06-24 DIAGNOSIS — I708 Atherosclerosis of other arteries: Secondary | ICD-10-CM | POA: Diagnosis not present

## 2021-06-24 DIAGNOSIS — I10 Essential (primary) hypertension: Secondary | ICD-10-CM

## 2021-06-24 DIAGNOSIS — E782 Mixed hyperlipidemia: Secondary | ICD-10-CM

## 2021-06-24 DIAGNOSIS — I739 Peripheral vascular disease, unspecified: Secondary | ICD-10-CM

## 2021-06-24 DIAGNOSIS — M7918 Myalgia, other site: Secondary | ICD-10-CM

## 2021-06-24 DIAGNOSIS — R8290 Unspecified abnormal findings in urine: Secondary | ICD-10-CM

## 2021-06-24 DIAGNOSIS — M25551 Pain in right hip: Secondary | ICD-10-CM

## 2021-06-24 DIAGNOSIS — Z8659 Personal history of other mental and behavioral disorders: Secondary | ICD-10-CM

## 2021-06-24 LAB — POCT URINALYSIS DIPSTICK
Bilirubin, UA: NEGATIVE
Glucose, UA: NEGATIVE
Ketones, UA: NEGATIVE
Leukocytes, UA: NEGATIVE
Nitrite, UA: NEGATIVE
Protein, UA: NEGATIVE
Spec Grav, UA: <=1.005 — AB (ref 1.010–1.025)
Urobilinogen, UA: 0.2 E.U./dL
pH, UA: 7.5 (ref 5.0–8.0)

## 2021-06-24 MED ORDER — KETOCONAZOLE 2 % EX CREA: 1.0000 "application " | TOPICAL_CREAM | Freq: Every day | CUTANEOUS | 0 refills | Status: DC

## 2021-06-24 NOTE — Progress Notes (Signed)
   Subjective:    Patient ID: Kristy Black, female    DOB: 08-29-1956, 65 y.o.   MRN: [id number]    HPI: 65 year old Female seen for health maintenance exam and evaluation of medical issues.  She has a history of smoking, hypertension, constipation treated with MiraLAX, anxiety, hyperlipidemia and GE reflux.  She is smoked for well over 20 years.  Smoking cessation has been discussed with her.  She has cut back considerably.  History of left breast fibroadenoma removed in 1995.  Right breast fibroadenoma removed in 2000.  C-section in 1991.  Bilateral tubal ligation 1995.  Benign left breast biopsy 2007.  She has had considerable issues with right hip and buttock pain.  She had negative MRI of the LS spine and negative hip films.  Has been followed by vascular surgery for vascular disease.  Had MRI of the LS spine in 2018 that did not reveal any spinal stenosis.  No neural impingement was noted or even disc herniation.  This pain bothers her every night and interferes with her sleep.  She currently is seeing Dr. Carlis Abbott, vascular surgeon for evaluation of this issue.  She has claudication issues.  Has known right common iliac artery occlusion.  Right ABI index indicates severe right lower extremity arterial disease.  Left ABI index indicates severe left lower extremity arterial disease. She currently is on statin medication.  She has a low HDL longstanding which is currently 43.  Total cholesterol, triglycerides and LDL cholesterol are normal on Crestor 20 mg daily.  Her glucose and kidney functions are normal.  Liver functions are normal.  Hypertension stable on metoprolol and HCTZ.  Social history: She retired from the [employer].  1 grandchild.  1 adult daughter.  Patient is married.  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     Review of Systems vaginal CIN-1 followed by Dr. Talbert Nan     Objective:   Physical Exam Blood pressure excellent 116/64 pulse 71  regular temperature 98.5 degrees BMI 26.16 pulse oximetry 99% weight 167 pounds  Skin: Warm and dry.  No cervical adenopathy, thyromegaly, carotid bruits.  Chest is clear to auscultation without rales or wheezing.  Cardiac exam: Regular rate and rhythm without ectopy or murmur.  Breasts are without masses.  Abdomen is soft nondistended without hepatosplenomegaly masses or tenderness.  GYN exam deferred to Dr. Talbert Nan.  No lower extremity edema.  Neurological exam is intact without focal deficits.  Affect thought and judgment are normal.       Assessment & Plan:  Severe claudication symptoms and severe peripheral artery disease with decrease in ABIs bilaterally  Essential hypertension stable on current regimen  Hyperlipidemia treated with statin medication is stable  History of smoking and continues to smoke.  Chronic pain in right buttock and leg worse at night has been evaluated extensively by vascular surgeon.  This has been present for several years.  MRI of the LS spine in 2018 showed no evidence of spinal stenosis or disc herniation or neural impingement  Health maintenance-flu vaccine given.  Records indicate only 2 COVID vaccines.  Colonoscopy discussed.  Plan: Continue current medications.  Continue follow-up with vascular surgery and return in 1 year or as needed.  Continue GYN follow-up with Dr. Talbert Nan.

## 2021-06-25 LAB — URINALYSIS, MICROSCOPIC ONLY
Bacteria, UA: NONE SEEN /HPF
Hyaline Cast: NONE SEEN /LPF
Squamous Epithelial / HPF: NONE SEEN /HPF (ref <=5)
WBC, UA: NONE SEEN /HPF (ref 0–5)

## 2021-07-17 ENCOUNTER — Other Ambulatory Visit: Payer: Self-pay | Admitting: Internal Medicine

## 2021-08-13 ENCOUNTER — Ambulatory Visit: Payer: 59 | Admitting: Vascular Surgery

## 2021-08-13 ENCOUNTER — Ambulatory Visit (HOSPITAL_COMMUNITY)
Admission: RE | Admit: 2021-08-13 | Discharge: 2021-08-13 | Disposition: A | Discharge status: Home / Self Care | Payer: 59 | Source: Ambulatory Visit | Attending: Vascular Surgery | Admitting: Vascular Surgery

## 2021-08-13 ENCOUNTER — Encounter: Payer: Self-pay | Admitting: Vascular Surgery

## 2021-08-13 ENCOUNTER — Other Ambulatory Visit: Payer: Self-pay

## 2021-08-13 VITALS — BP 132/77 | HR 65 | Temp 97.8°F | Resp 14 | Ht 66.0 in | Wt 165.0 lb

## 2021-08-13 DIAGNOSIS — I739 Peripheral vascular disease, unspecified: Secondary | ICD-10-CM | POA: Diagnosis present

## 2021-08-13 NOTE — Progress Notes (Signed)
Patient name: Kristy Black MRN: [id number] DOB: 16-Jun-1956 Sex: female  REASON FOR VISIT: 63-month follow-up for bilateral lower extremity buttock claudication  HPI: Kristy Black is a 65 y.o. female with history of hypertension, hyperlipidemia, tobacco abuse that presents for 54-month follow-up for bilateral lower extremity buttock claudication.  She has previously been followed by Dr. Donnetta Hutching.  She was last seen by me approximately 6 months ago.  She feels that her symptoms are stable and have actually improved.  States she can walk about 300 feet without stopping and this is improved from 150 feet.  Still smoking half pack a day.  No classic rest pain symptoms at night.  No open wounds.   Past Medical History:  Diagnosis Date   Claudication of gluteal region [facility]) 01/18/2018   Elevated homocysteine    Family history of breast cancer 05/20/2021   Family history of pancreatic cancer 05/20/2021   GERD (gastroesophageal reflux disease)    Hyperlipidemia    Hypertension     Past Surgical History:  Procedure Laterality Date   BREAST SURGERY     l breast biopsy   CESAREAN SECTION     TUBAL LIGATION      Family History  Problem Relation Age of Onset   Breast cancer Sister 39   Leukemia Sister 63   Lung cancer Maternal Uncle        dx after 87; no smoking hx   Breast cancer Paternal Aunt 60       x2 paternal aunts   Pancreatic cancer Paternal Uncle        d. 53   Cancer Paternal Uncle        unknown type; dx ~50    SOCIAL HISTORY: Social History   Tobacco Use   Smoking status: Every Day    Packs/day: 0.50    Types: Cigarettes   Smokeless tobacco: Never   Tobacco comments:    10 cigarettes/day  Substance Use Topics   Alcohol use: Yes    Comment: socially    Allergies  Allergen Reactions   Penicillins Hives    Current Outpatient Medications  Medication Sig Dispense Refill   Cholecalciferol (VITAMIN D3) 2000 units TABS Take 1 tablet by mouth daily.      hydrochlorothiazide (HYDRODIURIL) 25 MG tablet Take 1 tablet by mouth once daily 90 tablet 3   ibuprofen (ADVIL,MOTRIN) 200 MG tablet Take 200 mg by mouth every 6 (six) hours as needed.     ketoconazole (NIZORAL) 2 % cream Apply 1 application topically daily. 15 g 0   Magnesium 100 MG CAPS Take by mouth.     metoprolol succinate (TOPROL-XL) 50 MG 24 hr tablet Take 1 tablet by mouth once daily 90 tablet 3   Polyethylene Glycol 3350 (MIRALAX PO) Take by mouth daily.     pramoxine-hydrocortisone (PROCTOCREAM-HC) 1-1 % rectal cream Place 1 application rectally 2 (two) times daily. 30 g prn   rosuvastatin (CRESTOR) 20 MG tablet Take 1 tablet by mouth once daily 90 tablet 1   Zinc 30 MG CAPS Take 30 mg by mouth daily.     No current facility-administered medications for this visit.    REVIEW OF SYSTEMS:  [X]  denotes positive finding, [ ]  denotes negative finding Cardiac  Comments:  Chest pain or chest pressure:    Shortness of breath upon exertion:    Short of breath when lying flat:    Irregular heart rhythm:        Vascular  Pain in calf, thigh, or hip brought on by ambulation: x   Pain in feet at night that wakes you up from your sleep:     Blood clot in your veins:    Leg swelling:         Pulmonary    Oxygen at home:    Productive cough:     Wheezing:         Neurologic    Sudden weakness in arms or legs:     Sudden numbness in arms or legs:     Sudden onset of difficulty speaking or slurred speech:    Temporary loss of vision in one eye:     Problems with dizziness:         Gastrointestinal    Blood in stool:     Vomited blood:         Genitourinary    Burning when urinating:     Blood in urine:        Psychiatric    Major depression:         Hematologic    Bleeding problems:    Problems with blood clotting too easily:        Skin    Rashes or ulcers:        Constitutional    Fever or chills:      PHYSICAL EXAM: Vitals:   08/13/21 0947  BP: 132/77   Pulse: 65  Resp: 14  Temp: 97.8 F (36.6 C)  TempSrc: Temporal  SpO2: 97%  Weight: 165 lb (74.8 kg)  Height: 5\' 6"  (1.676 m)    GENERAL: The patient is a well-nourished female, in no acute distress. The vital signs are documented above. CARDIAC: There is a regular rate and rhythm.  VASCULAR:  Unable to appreciate any femoral or distal pulses No active tissue loss PULMONARY: No respiratory distress. ABDOMEN: Soft and non-tender. MUSCULOSKELETAL: There are no major deformities or cyanosis. NEUROLOGIC: No focal weakness or paresthesias are detected. SKIN: There are no ulcers or rashes noted. PSYCHIATRIC: The patient has a normal affect.  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  Assessment/Plan:  65 year old female with history of tobacco abuse that presents for 6 month follow-up of her bilateral lower extremity buttock claudication.  Prior CTA showed large calcified plaque in the distal abdominal aorta as well as significant bilateral common iliac artery disease.  She feels her claudication symptoms are stable if not improved.  She is now walking up to 300 feet.  She has no signs of critical limb ischemia like rest pain or tissue loss.  We both agree on continued conservative management.  I will see her in 1 year with ABIs.  Discussed she call if she has any worsening symptoms.  Marty Heck, MD [facility] Office: [phone]

## 2021-08-28 NOTE — Patient Instructions (Signed)
It was a pleasure to see you today.  Continue current medications and follow-up in 1 year.  Continue follow-up with vascular surgeon and Dr. Talbert Nan, GYN physician.

## 2021-10-18 ENCOUNTER — Other Ambulatory Visit: Payer: Self-pay | Admitting: Internal Medicine

## 2021-11-28 ENCOUNTER — Encounter: Payer: Self-pay | Admitting: Hematology and Oncology

## 2021-11-28 ENCOUNTER — Other Ambulatory Visit: Payer: Self-pay

## 2021-11-28 ENCOUNTER — Inpatient Hospital Stay: Payer: 59 | Attending: Hematology and Oncology | Admitting: Hematology and Oncology

## 2021-11-28 VITALS — BP 140/72 | HR 71 | Temp 97.5°F | Resp 16 | Ht 66.0 in | Wt 166.9 lb

## 2021-11-28 DIAGNOSIS — Z800 Family history of malignant neoplasm of digestive organs: Secondary | ICD-10-CM | POA: Diagnosis not present

## 2021-11-28 DIAGNOSIS — D72829 Elevated white blood cell count, unspecified: Secondary | ICD-10-CM | POA: Diagnosis not present

## 2021-11-28 DIAGNOSIS — F17210 Nicotine dependence, cigarettes, uncomplicated: Secondary | ICD-10-CM | POA: Insufficient documentation

## 2021-11-28 DIAGNOSIS — Z806 Family history of leukemia: Secondary | ICD-10-CM | POA: Insufficient documentation

## 2021-11-28 DIAGNOSIS — Z780 Asymptomatic menopausal state: Secondary | ICD-10-CM | POA: Insufficient documentation

## 2021-11-28 DIAGNOSIS — E785 Hyperlipidemia, unspecified: Secondary | ICD-10-CM | POA: Insufficient documentation

## 2021-11-28 DIAGNOSIS — Z801 Family history of malignant neoplasm of trachea, bronchus and lung: Secondary | ICD-10-CM | POA: Diagnosis not present

## 2021-11-28 DIAGNOSIS — Z803 Family history of malignant neoplasm of breast: Secondary | ICD-10-CM | POA: Diagnosis not present

## 2021-11-28 DIAGNOSIS — I10 Essential (primary) hypertension: Secondary | ICD-10-CM | POA: Diagnosis not present

## 2021-11-28 DIAGNOSIS — D72820 Lymphocytosis (symptomatic): Secondary | ICD-10-CM | POA: Insufficient documentation

## 2021-11-28 NOTE — Progress Notes (Signed)
[place] ?CONSULT NOTE ? ?Patient Care Team: ?Elby Showers, [name] ? ?CHIEF COMPLAINTS/PURPOSE OF CONSULTATION:  ?Lymphocytosis ? ?ASSESSMENT & PLAN:  ? ?This is a very pleasant 66 year old female patient with hypertension and dyslipidemia initially referred to hematology for evaluation of lymphocytosis.  We also identified that she is at high risk for breast cancer hence we calculated her lifetime risk of breast cancer using TC model and 5-year risk of breast cancer using Gail's model which she has at 38% and 4.6% respectively.  Discussed with her about tamoxifen prevention and she declined it.   ?She is here for follow-up.  Since her last visit, no new B symptoms.  She continues to feel well.  Physical examination today unremarkable, no palpable lymphadenopathy or hepatosplenomegaly.  We have discussed about repeating CBC today.  With regards to breast cancer screening, I have once again try to touch base about prevention of breast cancer, she is not ready to take any medications.  She will want to continue mammograms at this time. ? ?She can return to clinic in 6 months. ? ?HISTORY OF PRESENTING ILLNESS:  ? ?Kristy Black 66 y.o. female is here because of lymphocytosis. ? 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 ? ?Interval history ?Since last visit, she denies any new health complaints.   ?She continues to smoke.  She denies any B symptoms except for hot flashes which are likely related to menopause.  No change in breathing, bowel habits or urinary habits.  She had a mammogram recently which was unremarkable. ?Rest of the pertinent 10 point ROS reviewed and negative. ? ?MEDICAL  HISTORY:  ?Past Medical History:  ?Diagnosis Date  ? Claudication of gluteal region [facility]) 01/18/2018  ? Elevated homocysteine   ? Family history of breast cancer 05/20/2021  ? Family history of pancreatic cancer 05/20/2021  ? GERD (gastroesophageal reflux disease)   ? Hyperlipidemia   ? Hypertension   ? ? ?SURGICAL HISTORY: ?Past Surgical History:  ?Procedure Laterality Date  ? BREAST SURGERY    ? l breast biopsy  ? CESAREAN SECTION    ? TUBAL LIGATION    ? ? ?SOCIAL HISTORY: ?Social History  ? ?Socioeconomic History  ? Marital status: Married  ?  Spouse name: Not on file  ? Number of children: 1  ? Years of education: some college  ? Highest education level: Not on file  ?Occupational History  ? 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 Smoking status: Every Day  ?  Packs/day: 0.50  ?  Types: Cigarettes  ? Smokeless tobacco: Never  ? Tobacco comments:  ?  10 cigarettes/day  ?Vaping Use  ? Vaping Use: Never used  ?Substance and Sexual Activity  ? Alcohol use: Yes  ?  Comment: socially  ? Drug use: No  ? Sexual activity: Not Currently  ?  Birth control/protection: Post-menopausal  ?Other Topics Concern  ? Not on file  ?Social History Narrative  ? Lives  W/ husband  ? Caffeine use: 1x/week coffee  ? Daily tea  ? Right handed   ? ?Social Determinants of Health  ? ?Financial Resource Strain: Not on file  ?Food Insecurity: Not on file  ?Transportation Needs: Not on file  ?Physical  Activity: Not on file  ?Stress: Not on file  ?Social Connections: Not on file  ?Intimate Partner Violence: Not on file  ? ? ?FAMILY HISTORY: ?Family History  ?Problem Relation Age of Onset  ? Breast cancer Sister 72  ? Leukemia Sister 73  ? Lung cancer Maternal Uncle   ?     dx after 50; no smoking hx  ? Breast cancer Paternal Aunt 99  ?     x2 paternal aunts  ? Pancreatic cancer Paternal Uncle   ?     d. 63  ? Cancer Paternal Uncle   ?     unknown type; dx ~50  ? ? ?ALLERGIES:  is allergic to penicillins. ? ?MEDICATIONS:  ?Current Outpatient  Medications  ?Medication Sig Dispense Refill  ? Cholecalciferol (VITAMIN D3) 2000 units TABS Take 1 tablet by mouth daily.    ? hydrochlorothiazide (HYDRODIURIL) 25 MG tablet Take 1 tablet by mouth once daily 90 tablet 0  ? ibuprofen (ADVIL,MOTRIN) 200 MG tablet Take 200 mg by mouth every 6 (six) hours as needed.    ? ketoconazole (NIZORAL) 2 % cream Apply 1 application topically daily. 15 g 0  ? Magnesium 100 MG CAPS Take by mouth.    ? metoprolol succinate (TOPROL-XL) 50 MG 24 hr tablet Take 1 tablet by mouth once daily 90 tablet 0  ? Polyethylene Glycol 3350 (MIRALAX PO) Take by mouth daily.    ? pramoxine-hydrocortisone (PROCTOCREAM-HC) 1-1 % rectal cream Place 1 application rectally 2 (two) times daily. 30 g prn  ? rosuvastatin (CRESTOR) 20 MG tablet Take 1 tablet by mouth once daily 90 tablet 1  ? Zinc 30 MG CAPS Take 30 mg by mouth daily.    ? ?No current facility-administered medications for this visit.  ? ? ?PHYSICAL EXAMINATION: ? ?BP 140/72 (BP Location: Left Arm, Patient Position: Sitting)   Pulse 71   Temp (!) 97.5 ?F (36.4 ?C) (Temporal)   Resp 16   Ht '5\' 6"'$  (1.676 m)   Wt 166 lb 14.4 oz (75.7 kg)   SpO2 99%   BMI 26.94 kg/m?  ? ? ?ECOG PERFORMANCE STATUS: 0 - Asymptomatic ? ?Physical Exam ?Constitutional:   ?   Appearance: Normal appearance.  ?Cardiovascular:  ?   Rate and Rhythm: Normal rate and regular rhythm.  ?   Pulses: Normal pulses.  ?   Heart sounds: Normal heart sounds.  ?Pulmonary:  ?   Effort: Pulmonary effort is normal.  ?   Breath sounds: Normal breath sounds.  ?Abdominal:  ?   General: Abdomen is flat. Bowel sounds are normal. There is no distension.  ?   Palpations: Abdomen is soft.  ?   Tenderness: There is no abdominal tenderness.  ?Musculoskeletal:  ?   Cervical back: Normal range of motion and neck supple. No rigidity.  ?Lymphadenopathy:  ?   Cervical: No cervical adenopathy.  ?Skin: ?   General: Skin is warm and dry.  ?Neurological:  ?   General: No focal deficit present.  ?    Mental Status: She is alert.  ? ? ? ?LABORATORY DATA:  ?I have reviewed the data as listed ?Lab Results  ?Component Value Date  ? WBC 10.9 (H) 06/20/2021  ? HGB 15.6 (H) 06/20/2021  ? HCT 46.7 (H) 06/20/2021  ? MCV 86.8 06/20/2021  ? PLT 301 06/20/2021  ? ?  Chemistry   ?   ?Component Value Date/Time  ? NA 141 06/20/2021 0918  ? K 4.2 06/20/2021 0918  ?  CL 101 06/20/2021 0918  ? CO2 31 06/20/2021 0918  ? BUN 9 06/20/2021 0918  ? CREATININE 0.69 06/20/2021 0918  ?    ?Component Value Date/Time  ? CALCIUM 10.0 06/20/2021 0918  ? ALKPHOS 102 04/29/2021 1204  ? AST 17 06/20/2021 0918  ? AST 27 04/29/2021 1204  ? ALT 15 06/20/2021 0918  ? ALT 30 04/29/2021 1204  ? BILITOT 0.6 06/20/2021 0918  ? BILITOT 0.5 04/29/2021 1204  ?  ? ?I have reviewed pertinent CBCs for the past 2 years.  She has had mild leukocytosis with lymphocytosis for a while. ? ?I spent 20 minutes or greater in the care of this patient including history and physical, review of records, counseling and coordination of care.  We have discussed about MRI surveillance, role of tamoxifen versus aromatase inhibitors in preventing breast cancer, risk of breast cancer during her lifetime versus 5-year risk and lifestyle modifications.  ? ?RADIOGRAPHIC STUDIES: ?I have personally reviewed the radiological images as listed and agreed with the findings in the report. ?No results found. ? ? ?  ? Benay Pike, MD ?11/28/2021 9:24 AM ? ? ? ? ?

## 2021-11-29 ENCOUNTER — Telehealth: Payer: Self-pay | Admitting: Hematology and Oncology

## 2021-11-29 NOTE — Telephone Encounter (Signed)
Scheduled appointment per 03/30 los. Patient aware. ? ?

## 2021-12-04 ENCOUNTER — Inpatient Hospital Stay: Payer: 59

## 2021-12-11 ENCOUNTER — Inpatient Hospital Stay: Payer: 59

## 2022-01-13 NOTE — Progress Notes (Signed)
66 y.o. G71P1001 Married White or Caucasian Not Hispanic or Latino female here for annual exam.  No vaginal bleeding. Sexually active without penetration, both okay with it.   She takes miralax daily and has normal BM's daily.   Normal bladder function  Sister with breast cancer at 37, didn't have genetic testing. Died at 80 from leukemia.  TC risk 38%, Gail model 4.6%. Has seen Oncology offered her tamoxifen, she declined. Declined genetic testing. Not doing breast MRI's.  12 paternal aunts/uncles. 2 aunts out of 7 had breast cancer, in their late 60's at least.     No LMP recorded. Patient is postmenopausal.          Sexually active: No.  The current method of family planning is post menopausal status.    Exercising: Yes.     Walk Smoker:  yes, 1/2 PPD  Health Maintenance: Pap:  06/18/20 ASCUS HPV + History of abnormal Pap:  11/21 Colposcopy VAIN 1, ECC negative MMG:  12/06/21 Bi-rads 2 benign  BMD:   none  Colonoscopy: none  Cologuard 07/31/20 negative TDaP:  02/14/11, declines Gardasil: n/a   reports that she has been smoking cigarettes. She has been smoking an average of .5 packs per day. She has never used smokeless tobacco. She reports current alcohol use. She reports that she does not use drugs. Daughter and 54 year old granddaughter are local.   Past Medical History:  Diagnosis Date   Claudication of gluteal region ([place]) 01/18/2018   Elevated homocysteine    Family history of breast cancer 05/20/2021   Family history of pancreatic cancer 05/20/2021   GERD (gastroesophageal reflux disease)    Hyperlipidemia    Hypertension     Past Surgical History:  Procedure Laterality Date   BREAST SURGERY     l breast biopsy   CESAREAN SECTION     TUBAL LIGATION      Current Outpatient Medications  Medication Sig Dispense Refill   Cholecalciferol (VITAMIN D3) 2000 units TABS Take 1 tablet by mouth daily.     hydrochlorothiazide (HYDRODIURIL) 25 MG tablet Take 1 tablet by mouth  once daily 90 tablet 0   ibuprofen (ADVIL,MOTRIN) 200 MG tablet Take 200 mg by mouth every 6 (six) hours as needed.     ketoconazole (NIZORAL) 2 % cream Apply 1 application topically daily. 15 g 0   Magnesium 100 MG CAPS Take by mouth.     metoprolol succinate (TOPROL-XL) 50 MG 24 hr tablet Take 1 tablet by mouth once daily 90 tablet 0   Polyethylene Glycol 3350 (MIRALAX PO) Take by mouth daily.     pramoxine-hydrocortisone (PROCTOCREAM-HC) 1-1 % rectal cream Place 1 application rectally 2 (two) times daily. 30 g prn   rosuvastatin (CRESTOR) 20 MG tablet Take 1 tablet by mouth once daily 90 tablet 1   Zinc 30 MG CAPS Take 30 mg by mouth daily.     No current facility-administered medications for this visit.    Family History  Problem Relation Age of Onset   Breast cancer Sister 56   Leukemia Sister 30   Lung cancer Maternal Uncle        dx after 50; no smoking hx   Breast cancer Paternal Aunt 60       x2 paternal aunts   Pancreatic cancer Paternal Uncle        d. 73   Cancer Paternal Uncle        unknown type; dx ~50    Review of  Systems  All other systems reviewed and are negative.  Exam:   BP 138/80 (BP Location: Right Arm, Patient Position: Sitting, Cuff Size: Normal)   Pulse 78   Ht '5\' 6"'$  (1.676 m)   Wt 166 lb (75.3 kg)   SpO2 99%   BMI 26.79 kg/m   Weight change: '@WEIGHTCHANGE'$ @ Height:   Height: '5\' 6"'$  (167.6 cm)  Ht Readings from Last 3 Encounters:  01/16/22 '5\' 6"'$  (1.676 m)  11/28/21 '5\' 6"'$  (1.676 m)  08/13/21 '5\' 6"'$  (1.676 m)    General appearance: alert, cooperative and appears stated age Head: Normocephalic, without obvious abnormality, atraumatic Neck: no adenopathy, supple, symmetrical, trachea midline and thyroid normal to inspection and palpation Lungs: clear to auscultation bilaterally Cardiovascular: regular rate and rhythm Breasts: normal appearance, no masses or tenderness Abdomen: soft, non-tender; non distended,  no masses,  no  organomegaly Extremities: extremities normal, atraumatic, no cyanosis or edema Skin: Skin color, texture, turgor normal. No rashes or lesions Lymph nodes: Cervical, supraclavicular, and axillary nodes normal. No abnormal inguinal nodes palpated Neurologic: Grossly normal   Pelvic: External genitalia:  no lesions              Urethra:  normal appearing urethra with no masses, tenderness or lesions              Bartholins and Skenes: normal                 Vagina: normal appearing vagina with normal color and discharge, no lesions              Cervix: no lesions               Bimanual Exam:  Uterus:   no masses or tenderness              Adnexa: no mass, fullness, tenderness               Rectovaginal: Confirms               Anus:  normal sphincter tone, no lesions  Gae Dry chaperoned for the exam.  1. 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 Smoker - DG Bone Density; Future  4. Hypoestrogenism - DG Bone Density; Future  5. Screening for cervical cancer - Cytology - PAP  6. Screening for vaginal cancer - Cytology - PAP

## 2022-01-16 ENCOUNTER — Encounter: Payer: Self-pay | Admitting: Obstetrics and Gynecology

## 2022-01-16 ENCOUNTER — Ambulatory Visit (INDEPENDENT_AMBULATORY_CARE_PROVIDER_SITE_OTHER): Payer: 59 | Admitting: Obstetrics and Gynecology

## 2022-01-16 ENCOUNTER — Other Ambulatory Visit (HOSPITAL_COMMUNITY)
Admission: RE | Admit: 2022-01-16 | Discharge: 2022-01-16 | Disposition: A | Discharge status: Home / Self Care | Payer: 59 | Source: Ambulatory Visit | Attending: Obstetrics and Gynecology | Admitting: Obstetrics and Gynecology

## 2022-01-16 VITALS — BP 138/80 | HR 78 | Ht 66.0 in | Wt 166.0 lb

## 2022-01-16 DIAGNOSIS — E2839 Other primary ovarian failure: Secondary | ICD-10-CM

## 2022-01-16 DIAGNOSIS — Z87411 Personal history of vaginal dysplasia: Secondary | ICD-10-CM | POA: Diagnosis not present

## 2022-01-16 DIAGNOSIS — Z124 Encounter for screening for malignant neoplasm of cervix: Secondary | ICD-10-CM | POA: Insufficient documentation

## 2022-01-16 DIAGNOSIS — Z1272 Encounter for screening for malignant neoplasm of vagina: Secondary | ICD-10-CM | POA: Insufficient documentation

## 2022-01-16 DIAGNOSIS — F17200 Nicotine dependence, unspecified, uncomplicated: Secondary | ICD-10-CM

## 2022-01-16 DIAGNOSIS — Z01419 Encounter for gynecological examination (general) (routine) without abnormal findings: Secondary | ICD-10-CM

## 2022-01-16 NOTE — Patient Instructions (Signed)

## 2022-01-21 LAB — CYTOLOGY - PAP
Comment: NEGATIVE
Diagnosis: NEGATIVE
High risk HPV: NEGATIVE

## 2022-01-23 ENCOUNTER — Telehealth: Payer: Self-pay | Admitting: Internal Medicine

## 2022-01-23 MED ORDER — METOPROLOL SUCCINATE ER 50 MG PO TB24: 50.0000 mg | ORAL_TABLET | Freq: Every day | ORAL | 1 refills | Status: DC

## 2022-01-23 MED ORDER — HYDROCHLOROTHIAZIDE 25 MG PO TABS: 25.0000 mg | ORAL_TABLET | Freq: Every day | ORAL | 1 refills | Status: DC

## 2022-01-23 NOTE — Telephone Encounter (Signed)
Kristy Black [phone]  Nava called to say that the pharmacy claims to have sent refill request in twice for below medications. I do not see any request and she is completely out.   hydrochlorothiazide (HYDRODIURIL) 25 MG tablet  metoprolol succinate (TOPROL-XL) 50 MG 24 hr tablet  [place], [place] - [address] (Ph: [phone])

## 2022-01-23 NOTE — Telephone Encounter (Signed)
Refills sent

## 2022-01-25 ENCOUNTER — Other Ambulatory Visit: Payer: Self-pay | Admitting: Internal Medicine

## 2022-01-28 NOTE — Telephone Encounter (Signed)
Called pharmacy at 4 to ask them not to send refill request again as it was already filled.

## 2022-01-31 ENCOUNTER — Other Ambulatory Visit: Payer: Self-pay | Admitting: Obstetrics and Gynecology

## 2022-01-31 DIAGNOSIS — F17200 Nicotine dependence, unspecified, uncomplicated: Secondary | ICD-10-CM

## 2022-01-31 DIAGNOSIS — E2839 Other primary ovarian failure: Secondary | ICD-10-CM

## 2022-02-18 ENCOUNTER — Other Ambulatory Visit: Payer: Self-pay | Admitting: Internal Medicine

## 2022-05-29 ENCOUNTER — Telehealth: Payer: Self-pay

## 2022-05-29 ENCOUNTER — Inpatient Hospital Stay: Payer: 59 | Attending: Hematology and Oncology | Admitting: Hematology and Oncology

## 2022-05-29 ENCOUNTER — Inpatient Hospital Stay: Payer: 59

## 2022-05-29 VITALS — BP 150/63 | HR 77 | Temp 97.8°F | Resp 16 | Ht 66.0 in | Wt 166.3 lb

## 2022-05-29 DIAGNOSIS — F17210 Nicotine dependence, cigarettes, uncomplicated: Secondary | ICD-10-CM | POA: Insufficient documentation

## 2022-05-29 DIAGNOSIS — Z800 Family history of malignant neoplasm of digestive organs: Secondary | ICD-10-CM | POA: Insufficient documentation

## 2022-05-29 DIAGNOSIS — D72829 Elevated white blood cell count, unspecified: Secondary | ICD-10-CM | POA: Diagnosis not present

## 2022-05-29 DIAGNOSIS — D72820 Lymphocytosis (symptomatic): Secondary | ICD-10-CM | POA: Diagnosis present

## 2022-05-29 DIAGNOSIS — D751 Secondary polycythemia: Secondary | ICD-10-CM | POA: Diagnosis not present

## 2022-05-29 DIAGNOSIS — Z801 Family history of malignant neoplasm of trachea, bronchus and lung: Secondary | ICD-10-CM | POA: Diagnosis not present

## 2022-05-29 DIAGNOSIS — Z806 Family history of leukemia: Secondary | ICD-10-CM | POA: Insufficient documentation

## 2022-05-29 DIAGNOSIS — Z803 Family history of malignant neoplasm of breast: Secondary | ICD-10-CM | POA: Insufficient documentation

## 2022-05-29 LAB — CBC WITH DIFFERENTIAL/PLATELET
Abs Immature Granulocytes: 0.08 10*3/uL — ABNORMAL HIGH (ref 0.00–0.07)
Basophils Absolute: 0.1 10*3/uL (ref 0.0–0.1)
Basophils Relative: 0 %
Eosinophils Absolute: 0.2 10*3/uL (ref 0.0–0.5)
Eosinophils Relative: 1 %
HCT: 45.1 % (ref 36.0–46.0)
Hemoglobin: 15.4 g/dL — ABNORMAL HIGH (ref 12.0–15.0)
Immature Granulocytes: 1 %
Lymphocytes Relative: 35 %
Lymphs Abs: 4.9 10*3/uL — ABNORMAL HIGH (ref 0.7–4.0)
MCH: 29.2 pg (ref 26.0–34.0)
MCHC: 34.1 g/dL (ref 30.0–36.0)
MCV: 85.6 fL (ref 80.0–100.0)
Monocytes Absolute: 0.9 10*3/uL (ref 0.1–1.0)
Monocytes Relative: 7 %
Neutro Abs: 7.8 10*3/uL — ABNORMAL HIGH (ref 1.7–7.7)
Neutrophils Relative %: 56 %
Platelets: 310 10*3/uL (ref 150–400)
RBC: 5.27 MIL/uL — ABNORMAL HIGH (ref 3.87–5.11)
RDW: 14.2 % (ref 11.5–15.5)
WBC: 13.9 10*3/uL — ABNORMAL HIGH (ref 4.0–10.5)
nRBC: 0 % (ref 0.0–0.2)

## 2022-05-29 LAB — COMPREHENSIVE METABOLIC PANEL
ALT: 16 U/L (ref 0–44)
AST: 18 U/L (ref 15–41)
Albumin: 4.3 g/dL (ref 3.5–5.0)
Alkaline Phosphatase: 91 U/L (ref 38–126)
Anion gap: 10 (ref 5–15)
BUN: 9 mg/dL (ref 8–23)
CO2: 30 mmol/L (ref 22–32)
Calcium: 9.6 mg/dL (ref 8.9–10.3)
Chloride: 100 mmol/L (ref 98–111)
Creatinine, Ser: 0.59 mg/dL (ref 0.44–1.00)
GFR, Estimated: >60 mL/min (ref >60)
Glucose, Bld: 131 mg/dL — ABNORMAL HIGH (ref 70–99)
Potassium: 3.8 mmol/L (ref 3.5–5.1)
Sodium: 140 mmol/L (ref 135–145)
Total Bilirubin: 0.4 mg/dL (ref 0.3–1.2)
Total Protein: 7.3 g/dL (ref 6.5–8.1)

## 2022-05-29 LAB — LACTATE DEHYDROGENASE: LDH: 130 U/L (ref 98–192)

## 2022-05-29 NOTE — Telephone Encounter (Signed)
error 

## 2022-05-29 NOTE — Progress Notes (Unsigned)
[place] CONSULT NOTE  Patient Care Team: Baxley, Cresenciano Lick, [name]  CHIEF COMPLAINTS/PURPOSE OF CONSULTATION:  Lymphocytosis  ASSESSMENT & PLAN:   This is a very pleasant 66 year old female patient with hypertension and dyslipidemia initially referred to hematology for evaluation of lymphocytosis.  We also identified that she is at high risk for breast cancer hence we calculated her lifetime risk of breast cancer using TC model and 5-year risk of breast cancer using Gail's model which she has at 38% and 4.6% respectively.  Discussed with her about tamoxifen prevention and she declined it.   Since last visit, she had mammogram in April 2023, no mammographic evidence of malignancy. Breast composition category C. We today have also discussed about MRI for breast cancer screening given her life time risk.  We discussed unknown long-term risk of gadolinium deposition and increased sensitivity with MRIs which may lead to additional biopsies She is agreeable to MRI, this has been ordered.  She was initially wondering if she can alternate with PCP/ gynecology for follow up. Labs today once again with some lymphocytosis, flow done previously negative for any monoclonal B cell population. She may benefit from RTC in 6 months. She will keep [place] posted.  HISTORY OF PRESENTING ILLNESS:   Kristy Black 66 y.o. female is here because of lymphocytosis.  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  Interval history  She is here for follow up. She denies any new complaints. She is agreeable to have an MRI done. No other complaints yet. No fevers/drenching night sweats, loss of appetite and  weight.  MEDICAL HISTORY:  Past Medical History:  Diagnosis Date   Claudication of gluteal region [facility]) 01/18/2018   Elevated homocysteine    Family history of breast cancer 05/20/2021   Family history of pancreatic cancer 05/20/2021   GERD (gastroesophageal reflux disease)    Hyperlipidemia    Hypertension     SURGICAL HISTORY: Past Surgical History:  Procedure Laterality Date   BREAST SURGERY     l breast biopsy   CESAREAN SECTION     TUBAL LIGATION      SOCIAL HISTORY: Social History   Socioeconomic History   Marital status: Married    Spouse name: Not on file   Number of children: 1   Years of education: some college   Highest education level: Not on file  Occupational History   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  Tobacco Use   Smoking status: Every Day    Packs/day: 0.50    Types: Cigarettes   Smokeless tobacco: Never   Tobacco comments:    10 cigarettes/day  Vaping Use   Vaping Use: Never used  Substance and Sexual Activity   Alcohol use: Yes    Comment: socially   Drug use: No   Sexual activity: Not Currently    Birth control/protection: Post-menopausal  Other Topics Concern   Not on file  Social History Narrative   Lives  W/ husband   Caffeine use: 1x/week coffee   Daily tea   Right handed    Social Determinants of Health   Financial Resource Strain: Not on file  Food Insecurity: Not on file  Transportation Needs: Not on file  Physical Activity: Not on file  Stress: Not on file  Social Connections: Not on file  Intimate Partner Violence: Not on file    FAMILY HISTORY: Family History  Problem Relation Age of Onset   Breast cancer Sister 49   Leukemia Sister 45   Lung cancer Maternal Uncle        dx after 80; no smoking hx   Breast cancer Paternal Aunt 60       x2 paternal aunts   Pancreatic cancer Paternal Uncle        d. 39   Cancer Paternal Uncle        unknown type; dx ~50    ALLERGIES:  is allergic to penicillins.  MEDICATIONS:   Current Outpatient Medications  Medication Sig Dispense Refill   Cholecalciferol (VITAMIN D3) 2000 units TABS Take 1 tablet by mouth daily.     hydrochlorothiazide (HYDRODIURIL) 25 MG tablet Take 1 tablet (25 mg total) by mouth daily. 90 tablet 1   ibuprofen (ADVIL,MOTRIN) 200 MG tablet Take 200 mg by mouth every 6 (six) hours as needed.     ketoconazole (NIZORAL) 2 % cream Apply 1 application topically daily. 15 g 0   Magnesium 100 MG CAPS Take by mouth.     metoprolol succinate (TOPROL-XL) 50 MG 24 hr tablet Take 1 tablet (50 mg total) by mouth daily. Take with or immediately following a meal. 90 tablet 1   Polyethylene Glycol 3350 (MIRALAX PO) Take by mouth daily.     pramoxine-hydrocortisone (PROCTOCREAM-HC) 1-1 % rectal cream Place 1 application rectally 2 (two) times daily. 30 g prn   rosuvastatin (CRESTOR) 20 MG tablet Take 1 tablet by mouth once daily 90 tablet 3   Zinc 30 MG CAPS Take 30 mg by mouth daily.     No current facility-administered medications for this visit.    PHYSICAL EXAMINATION:  BP (!) 150/63 (BP Location: Left Arm, Patient Position: Sitting) Comment: provider notified  Pulse 77   Temp 97.8 F (36.6 C) (Temporal)   Resp 16   Ht '5\' 6"'$  (1.676 m)   Wt 166 lb 4.8 oz (75.4 kg)   SpO2 99%   BMI 26.84 kg/m    ECOG PERFORMANCE STATUS: 0 - Asymptomatic  Physical Exam Constitutional:      Appearance: Normal appearance.  Cardiovascular:     Rate and Rhythm: Normal rate and regular rhythm.     Pulses: Normal pulses.     Heart sounds: Normal heart sounds.  Pulmonary:     Effort: Pulmonary effort is normal.     Breath sounds: Normal breath sounds.  Chest:     Comments: Bilateral breasts inspected. No palpable masses or regional adenopathy Abdominal:     General: Abdomen is flat. Bowel sounds are normal. There is no distension.     Palpations: Abdomen is soft.     Tenderness: There is no abdominal tenderness.  Musculoskeletal:     Cervical back: Normal  range of motion and neck supple. No rigidity.  Lymphadenopathy:     Cervical: No cervical adenopathy.  Skin:    General: Skin is warm and dry.  Neurological:     General: No focal deficit present.     Mental Status: She is alert.      LABORATORY DATA:  I have reviewed the data as listed Lab Results  Component Value Date   WBC 13.9 (H) 05/29/2022   HGB 15.4 (H) 05/29/2022   HCT 45.1 05/29/2022   MCV 85.6 05/29/2022   PLT 310 05/29/2022  Chemistry      Component Value Date/Time   NA 140 05/29/2022 1627   K 3.8 05/29/2022 1627   CL 100 05/29/2022 1627   CO2 30 05/29/2022 1627   BUN 9 05/29/2022 1627   CREATININE 0.59 05/29/2022 1627   CREATININE 0.69 06/20/2021 0918      Component Value Date/Time   CALCIUM 9.6 05/29/2022 1627   ALKPHOS 91 05/29/2022 1627   AST 18 05/29/2022 1627   AST 27 04/29/2021 1204   ALT 16 05/29/2022 1627   ALT 30 04/29/2021 1204   BILITOT 0.4 05/29/2022 1627   BILITOT 0.5 04/29/2021 1204     CBC from today showed leukocytosis, neutrophilia, lymphocytosis, no significant change in lymphocytosis. 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  I spent 30 minutes or greater in the care of this patient including history and physical, review of records, counseling and coordination of care.  We have discussed about MRI surveillance, role of tamoxifen versus aromatase inhibitors in preventing breast cancer, risk of breast cancer during her lifetime versus 5-year risk and lifestyle modifications.   RADIOGRAPHIC STUDIES: I have personally reviewed the radiological images as listed and agreed with the findings in the report. No results found.     Benay Pike, MD 05/31/2022 10:27 AM

## 2022-05-31 ENCOUNTER — Encounter: Payer: Self-pay | Admitting: Hematology and Oncology

## 2022-06-24 ENCOUNTER — Other Ambulatory Visit: Payer: 59

## 2022-06-24 DIAGNOSIS — F419 Anxiety disorder, unspecified: Secondary | ICD-10-CM

## 2022-06-24 DIAGNOSIS — R5383 Other fatigue: Secondary | ICD-10-CM

## 2022-06-24 DIAGNOSIS — I10 Essential (primary) hypertension: Secondary | ICD-10-CM

## 2022-06-24 DIAGNOSIS — E785 Hyperlipidemia, unspecified: Secondary | ICD-10-CM

## 2022-06-25 LAB — CBC WITH DIFFERENTIAL/PLATELET
Absolute Monocytes: 825 cells/uL (ref 200–950)
Basophils Absolute: 50 cells/uL (ref 0–200)
Basophils Relative: 0.4 %
Eosinophils Absolute: 175 cells/uL (ref 15–500)
Eosinophils Relative: 1.4 %
HCT: 47.1 % — ABNORMAL HIGH (ref 35.0–45.0)
Hemoglobin: 16.1 g/dL — ABNORMAL HIGH (ref 11.7–15.5)
Lymphs Abs: 3450 cells/uL (ref 850–3900)
MCH: 29.5 pg (ref 27.0–33.0)
MCHC: 34.2 g/dL (ref 32.0–36.0)
MCV: 86.3 fL (ref 80.0–100.0)
MPV: 9.9 fL (ref 7.5–12.5)
Monocytes Relative: 6.6 %
Neutro Abs: 8000 cells/uL — ABNORMAL HIGH (ref 1500–7800)
Neutrophils Relative %: 64 %
Platelets: 350 10*3/uL (ref 140–400)
RBC: 5.46 10*6/uL — ABNORMAL HIGH (ref 3.80–5.10)
RDW: 12.9 % (ref 11.0–15.0)
Total Lymphocyte: 27.6 %
WBC: 12.5 10*3/uL — ABNORMAL HIGH (ref 3.8–10.8)

## 2022-06-25 LAB — LIPID PANEL
Cholesterol: 231 mg/dL — ABNORMAL HIGH (ref <200)
HDL: 40 mg/dL — ABNORMAL LOW (ref >=50)
LDL Cholesterol (Calc): 152 mg/dL (calc) — ABNORMAL HIGH
Non-HDL Cholesterol (Calc): 191 mg/dL (calc) — ABNORMAL HIGH (ref <130)
Total CHOL/HDL Ratio: 5.8 (calc) — ABNORMAL HIGH (ref <5.0)
Triglycerides: 218 mg/dL — ABNORMAL HIGH (ref <150)

## 2022-06-25 LAB — TSH: TSH: 3.19 mIU/L (ref 0.40–4.50)

## 2022-06-26 ENCOUNTER — Ambulatory Visit (INDEPENDENT_AMBULATORY_CARE_PROVIDER_SITE_OTHER): Payer: 59 | Admitting: Internal Medicine

## 2022-06-26 ENCOUNTER — Encounter: Payer: Self-pay | Admitting: Internal Medicine

## 2022-06-26 VITALS — BP 124/72 | HR 76 | Temp 98.0°F | Ht 66.0 in | Wt 164.8 lb

## 2022-06-26 DIAGNOSIS — E786 Lipoprotein deficiency: Secondary | ICD-10-CM

## 2022-06-26 DIAGNOSIS — J22 Unspecified acute lower respiratory infection: Secondary | ICD-10-CM | POA: Diagnosis not present

## 2022-06-26 DIAGNOSIS — I10 Essential (primary) hypertension: Secondary | ICD-10-CM

## 2022-06-26 DIAGNOSIS — F17200 Nicotine dependence, unspecified, uncomplicated: Secondary | ICD-10-CM | POA: Diagnosis not present

## 2022-06-26 DIAGNOSIS — E782 Mixed hyperlipidemia: Secondary | ICD-10-CM

## 2022-06-26 DIAGNOSIS — E7800 Pure hypercholesterolemia, unspecified: Secondary | ICD-10-CM | POA: Diagnosis not present

## 2022-06-26 DIAGNOSIS — Z0000 Encounter for general adult medical examination without abnormal findings: Secondary | ICD-10-CM

## 2022-06-26 DIAGNOSIS — I700 Atherosclerosis of aorta: Secondary | ICD-10-CM

## 2022-06-26 DIAGNOSIS — D72820 Lymphocytosis (symptomatic): Secondary | ICD-10-CM

## 2022-06-26 DIAGNOSIS — I708 Atherosclerosis of other arteries: Secondary | ICD-10-CM

## 2022-06-26 LAB — POCT URINALYSIS DIPSTICK
Bilirubin, UA: NEGATIVE
Glucose, UA: NEGATIVE
Ketones, UA: NEGATIVE
Leukocytes, UA: NEGATIVE
Nitrite, UA: NEGATIVE
Protein, UA: NEGATIVE
Spec Grav, UA: 1.01 (ref 1.010–1.025)
Urobilinogen, UA: 0.2 E.U./dL
pH, UA: 6.5 (ref 5.0–8.0)

## 2022-06-26 MED ORDER — AZITHROMYCIN 250 MG PO TABS: ORAL_TABLET | ORAL | 0 refills | Status: AC

## 2022-06-26 NOTE — Progress Notes (Signed)
Subjective:    Patient ID: Kristy Black, female    DOB: [birth year], 66 y.o.   MRN: [id number]  HPI  66 year old Female seen for health maintenance exam and evaluation of medical issues.  Patient has developed cough that is productive.  Family history of heart disease in her family.  Have ordered CT coronary calcium scoring for her.  Immunizations discussed.  She will get these at [facility].  Needs tetanus update, flu vaccine, RSV since she is around her granddaughter and pneumococcal 20 vaccine.  She has a history of lymphocytosis and was seen by Dr. Chryl Heck with Oncology in late September.  Previous flow cytometry showed polyclonal lymphocytosis.  No evidence of monoclonal B-cell population.   She was also identified at that time as having a 38% lifetime risk of breast cancer.  Tamoxifen prevention was discussed and she declined.  Had negative mammogram April 2023.  MRI for breast cancer screening was also discussed.  She has smoked for well over 20 years.  She has cut back considerably but continues to smoke.  Smoking cessation has been discussed with her previously.  History of left breast fibroadenoma removed in 1995.  Right breast fibroadenoma removed in 2000.  C-section 1991.  Bilateral tubal ligation 1995.  Benign left breast biopsy 2007.  She has had considerable issues with right hip and buttock pain.  She had negative MRI of the LS spine and negative hip films.  Has been followed by vascular surgery for vascular disease.  Had MRI of the LS spine in 2018 that did not show spinal stenosis.  No neural impingement was noted or even disc herniation.  The pain bothers her at night and interferes with her sleep.  She has seen Dr. Carlis Abbott, vascular surgeon for evaluation of this issue.  She has claudication issues and has known right common iliac artery occlusion.  Right ABI command index indicates severe right lower extremity arterial disease.  Left ABI indicated severe left lower extremity  arterial disease.  Is on statin medication.  Has long standing history of low HDL.  Otherwise lipids are normal on Crestor 20 mg daily.  Glucose and kidney functions are normal.  Liver functions are normal.  Social history: She retired from [employer].  1 grandchild.  1 adult daughter.  She is married.  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  Review of Systems no new complaints     Objective:   Physical Exam Vital signs reviewed.  Skin: Warm and dry.  No cervical adenopathy, thyromegaly or carotid bruits.  Chest is clear to auscultation without rales or wheezing.  Cardiac exam: Regular rate and rhythm without ectopy or murmurs.  Breast are without masses.  Abdomen is soft nondistended without hepatosplenomegaly, masses or tenderness.  GYN exam is deferred to Dr. Talbert Nan.  No edema of the lower extremities.  Brief neurological exam is intact without gross focal deficits.  Affect, follow-up, and judgment are normal.       Assessment & Plan:   Getting a flu shot this weekend.  Mixed hyperlipidemia-total cholesterol 231 and triglycerides 218 with LDL of 152-continue statin medication  Low HDL of 40  Acute respiratory infection-will be treated with Z-Pak  History of smoking and continues to smoke essential hypertension stable on current medication  Severe claudication and peripheral artery disease with decrease in ABIs bilaterally  Plan: Patient has had recent respiratory illness.  She will be treated  with Zithromax Z-PAK.  Vaccines have been discussed with her and she can get them at pharmacy when improved from respiratory illness.  Suggest repeat lipid panel when improved from respiratory infection.

## 2022-06-26 NOTE — Patient Instructions (Addendum)
Repeat lipid panel when improved from respiratory illness. Vaccines discussed and she will get them at pharmacy when improved from respiratory illness.Take Zithromax Z pak 2 tabs day 1 followed by one tab days 2-5

## 2022-07-02 ENCOUNTER — Encounter: Payer: Self-pay | Admitting: Internal Medicine

## 2022-07-08 ENCOUNTER — Ambulatory Visit (HOSPITAL_COMMUNITY)
Admission: RE | Admit: 2022-07-08 | Discharge: 2022-07-08 | Disposition: A | Discharge status: Home / Self Care | Payer: 59 | Source: Ambulatory Visit | Attending: Internal Medicine | Admitting: Internal Medicine

## 2022-07-08 DIAGNOSIS — E7800 Pure hypercholesterolemia, unspecified: Secondary | ICD-10-CM | POA: Insufficient documentation

## 2022-07-12 ENCOUNTER — Other Ambulatory Visit: Payer: Self-pay | Admitting: Internal Medicine

## 2022-07-14 ENCOUNTER — Other Ambulatory Visit: Payer: 59

## 2022-07-15 ENCOUNTER — Telehealth: Payer: Self-pay | Admitting: Internal Medicine

## 2022-07-15 ENCOUNTER — Ambulatory Visit: Payer: 59 | Admitting: Internal Medicine

## 2022-07-15 ENCOUNTER — Encounter: Payer: Self-pay | Admitting: Internal Medicine

## 2022-07-15 VITALS — BP 116/70 | HR 68 | Temp 98.3°F

## 2022-07-15 DIAGNOSIS — J22 Unspecified acute lower respiratory infection: Secondary | ICD-10-CM | POA: Diagnosis not present

## 2022-07-15 DIAGNOSIS — F17200 Nicotine dependence, unspecified, uncomplicated: Secondary | ICD-10-CM | POA: Diagnosis not present

## 2022-07-15 DIAGNOSIS — I10 Essential (primary) hypertension: Secondary | ICD-10-CM

## 2022-07-15 DIAGNOSIS — E782 Mixed hyperlipidemia: Secondary | ICD-10-CM

## 2022-07-15 MED ORDER — PREDNISONE 10 MG PO TABS: ORAL_TABLET | ORAL | 0 refills | Status: DC

## 2022-07-15 MED ORDER — DOXYCYCLINE HYCLATE 100 MG PO TABS: 100.0000 mg | ORAL_TABLET | Freq: Two times a day (BID) | ORAL | 0 refills | Status: DC

## 2022-07-15 NOTE — Telephone Encounter (Signed)
Kristy Black [phone]  [name] called to say she still has cough and congestion, phlegm is mostly clear. She wants to come in to make sure she does not have bronchitis since she is not getting any better. Been going on for a few weeks now. Scheduled for this afternoon.

## 2022-07-15 NOTE — Progress Notes (Signed)
   Subjective:    Patient ID: Kristy Black, female    DOB: [birth year], 66 y.o.   MRN: [id number]  HPI Pleasant 66 year old Female presents today with persistent cough and respiratory congestion.  Sputum is mostly clear.  Symptoms have been dragging on for several weeks.  She feels she is not getting any better.  Wanted to be checked for bronchitis.  She had health maintenance exam here in October and was felt to have a lower respiratory infection with productive cough treated with Zithromax at that time.  She was not febrile at that time.  Coronary calcium scoring done on November 7 showed a total score of 164.  Her CT chest showed aortic atherosclerosis and mild emphysematous changes but no acute pulmonary findings or worrisome pulmonary lesions.  She has a small hiatal hernia.  She is on her statin medication consisting of rosuvastatin 20 mg daily.  She has hypertension treated with metoprolol and HCTZ.  She has intermittent claudication symptoms.  Has seen vascular surgeon.    Review of Systems no fever, chills, nausea, vomiting, headache     Objective:   Physical Exam  Vital signs reviewed.  She is afebrile.  Blood pressure 116/70 pulse 68 pulse oximetry 97% on room air  Skin: Warm and dry.  No cervical adenopathy.  Pharynx is clear.  TMs clear.  Neck supple.  Chest clear.  No lower extremity edema.  Cardiac exam: Regular rate and rhythm.      Assessment & Plan:   Acute lower respiratory infection-treated with Zithromax in late October and still having coughing.  Recent CT of chest showed no evidence of lung cancer or pulmonary embolism.  Hyperlipidemia treated with statin  History of intermittent claudication seen by vascular surgeon  Smoker-urged to quit  History of essential hypertension-stable on current regimen  Plan: Patient treated today with with prednisone 10 mg (# 21 tablets) starting with 6 tablets day 1 and decreasing by 1 tablet daily for protracted respiratory  congestion in the setting of smoking.  It is felt she has an acute lower respiratory infection.  She will also take doxycycline 100 mg twice daily for 10 days.  She previously was treated with Zithromax in late October but has not improved.  Recent CT of chest reviewed with her.  Encourage patient to quit smoking.

## 2022-08-29 NOTE — Patient Instructions (Addendum)
Take tapering course of prednisone starting with 6x 10 mg tablets on day 1 and decreasing by 1 tablet daily I.e. 6-5-4-3-2-1 taper.  Take doxycycline 100 mg twice daily for 10 days.  Recent coronary calcium and chest CT showed no evidence of lung cancer.

## 2022-09-09 ENCOUNTER — Other Ambulatory Visit: Payer: Self-pay | Admitting: *Deleted

## 2022-09-09 DIAGNOSIS — I739 Peripheral vascular disease, unspecified: Secondary | ICD-10-CM

## 2022-09-09 DIAGNOSIS — I70213 Atherosclerosis of native arteries of extremities with intermittent claudication, bilateral legs: Secondary | ICD-10-CM

## 2022-09-22 ENCOUNTER — Encounter: Payer: Self-pay | Admitting: Internal Medicine

## 2022-09-22 ENCOUNTER — Other Ambulatory Visit: Payer: Self-pay

## 2022-09-22 MED ORDER — HYDROCORTISONE ACE-PRAMOXINE 1-1 % EX CREA: 1.0000 | TOPICAL_CREAM | Freq: Two times a day (BID) | CUTANEOUS | 23 refills | Status: DC

## 2022-09-23 ENCOUNTER — Encounter: Payer: Self-pay | Admitting: Vascular Surgery

## 2022-09-23 ENCOUNTER — Ambulatory Visit: Payer: 59 | Admitting: Vascular Surgery

## 2022-09-23 ENCOUNTER — Ambulatory Visit (HOSPITAL_COMMUNITY)
Admission: RE | Admit: 2022-09-23 | Discharge: 2022-09-23 | Disposition: A | Discharge status: Home / Self Care | Payer: 59 | Source: Ambulatory Visit | Attending: Vascular Surgery | Admitting: Vascular Surgery

## 2022-09-23 VITALS — Resp 16 | Ht 67.0 in | Wt 161.0 lb

## 2022-09-23 DIAGNOSIS — I739 Peripheral vascular disease, unspecified: Secondary | ICD-10-CM | POA: Insufficient documentation

## 2022-09-23 DIAGNOSIS — I70213 Atherosclerosis of native arteries of extremities with intermittent claudication, bilateral legs: Secondary | ICD-10-CM | POA: Diagnosis not present

## 2022-09-23 LAB — VAS US ABI WITH/WO TBI
Left ABI: 0.27
Right ABI: 0.29

## 2022-09-23 NOTE — Progress Notes (Signed)
Patient name: Kristy Black MRN: [id number] DOB: 07-Jan-1956 Sex: female  REASON FOR VISIT: 1 year follow-up for bilateral lower extremity claudication  HPI: Kristy Black is a 67 y.o. female with history of hypertension, hyperlipidemia, tobacco abuse that presents for 1 year follow-up for bilateral lower extremity buttock claudication.  She has previously been followed by Dr. Donnetta Hutching.   She feels that her symptoms are stable over the past year.  States she can walk about 200-300 feet without stopping.  Still smoking half pack a day.  No classic rest pain symptoms at night.  No open wounds.  No new concerns.  Past Medical History:  Diagnosis Date   Claudication of gluteal region [facility]) 01/18/2018   Elevated homocysteine    Family history of breast cancer 05/20/2021   Family history of pancreatic cancer 05/20/2021   GERD (gastroesophageal reflux disease)    Hyperlipidemia    Hypertension     Past Surgical History:  Procedure Laterality Date   BREAST SURGERY     l breast biopsy   CESAREAN SECTION     TUBAL LIGATION      Family History  Problem Relation Age of Onset   Breast cancer Sister 26   Leukemia Sister 25   Lung cancer Maternal Uncle        dx after 47; no smoking hx   Breast cancer Paternal Aunt 60       x2 paternal aunts   Pancreatic cancer Paternal Uncle        d. 56   Cancer Paternal Uncle        unknown type; dx ~50    SOCIAL HISTORY: Social History   Tobacco Use   Smoking status: Every Day    Packs/day: 0.50    Types: Cigarettes   Smokeless tobacco: Never   Tobacco comments:    10 cigarettes/day  Substance Use Topics   Alcohol use: Yes    Comment: socially    Allergies  Allergen Reactions   Penicillins Hives    Current Outpatient Medications  Medication Sig Dispense Refill   Cholecalciferol (VITAMIN D3) 2000 units TABS Take 1 tablet by mouth daily.     doxycycline (VIBRA-TABS) 100 MG tablet Take 1 tablet (100 mg total) by mouth 2 (two) times  daily. 20 tablet 0   hydrochlorothiazide (HYDRODIURIL) 25 MG tablet Take 1 tablet by mouth once daily 90 tablet 3   ibuprofen (ADVIL,MOTRIN) 200 MG tablet Take 200 mg by mouth every 6 (six) hours as needed.     ketoconazole (NIZORAL) 2 % cream Apply 1 application topically daily. 15 g 0   metoprolol succinate (TOPROL-XL) 50 MG 24 hr tablet TAKE 1 TABLET BY MOUTH ONCE DAILY WITH MEALS OR  IMMEDIATELY  FOLLOWING  A  MEAL 90 tablet 3   Polyethylene Glycol 3350 (MIRALAX PO) Take by mouth daily.     pramoxine-hydrocortisone (PROCTOCREAM-HC) 1-1 % rectal cream Place 1 Application rectally 2 (two) times daily. Year supply 30 g 23   predniSONE (DELTASONE) 10 MG tablet Take in tapering course as directed 6-5-4-3-2-1 21 tablet 0   rosuvastatin (CRESTOR) 20 MG tablet Take 1 tablet by mouth once daily 90 tablet 3   Zinc 30 MG CAPS Take 30 mg by mouth daily.     No current facility-administered medications for this visit.    REVIEW OF SYSTEMS:  '[X]'$  denotes positive finding, '[ ]'$  denotes negative finding Cardiac  Comments:  Chest pain or chest pressure:    Shortness of  breath upon exertion:    Short of breath when lying flat:    Irregular heart rhythm:        Vascular    Pain in calf, thigh, or hip brought on by ambulation: x   Pain in feet at night that wakes you up from your sleep:     Blood clot in your veins:    Leg swelling:         Pulmonary    Oxygen at home:    Productive cough:     Wheezing:         Neurologic    Sudden weakness in arms or legs:     Sudden numbness in arms or legs:     Sudden onset of difficulty speaking or slurred speech:    Temporary loss of vision in one eye:     Problems with dizziness:         Gastrointestinal    Blood in stool:     Vomited blood:         Genitourinary    Burning when urinating:     Blood in urine:        Psychiatric    Major depression:         Hematologic    Bleeding problems:    Problems with blood clotting too easily:         Skin    Rashes or ulcers:        Constitutional    Fever or chills:      PHYSICAL EXAM: Vitals:   09/23/22 1551  Resp: 16  Weight: 161 lb (73 kg)  Height: '5\' 7"'$  (1.702 m)    GENERAL: The patient is a well-nourished female, in no acute distress. The vital signs are documented above. CARDIAC: There is a regular rate and rhythm.  VASCULAR:  No palpable femoral pulses No active tissue loss PULMONARY: No respiratory distress. ABDOMEN: Soft and non-tender. MUSCULOSKELETAL: There are no major deformities or cyanosis. NEUROLOGIC: No focal weakness or paresthesias are detected. SKIN: There are no ulcers or rashes noted. PSYCHIATRIC: The patient has a normal affect.  DATA:   ABIs today 0.29 right and 0.27 left (previously 0.40 on the right monophasic and 0.46 on the left monophasic)   Previous CTA 07/06/2020 shows high-grade stenosis of the distal infrarenal abdominal aorta with multiple plaques as well as bilateral common iliac artery high grade stenosis.  Assessment/Plan:  67 year old female with history of tobacco abuse that presents for 1 year follow-up of her bilateral lower extremity buttock claudication.  Prior CTA showed large calcified plaque in the distal abdominal aorta as well as significant bilateral common iliac artery disease.  She feels her claudication symptoms are stable over the past year and walking 200- 300 feet without stopping.  States she can get through the grocery store and do most of her activities of daily living sometimes requiring her to take a break.  She wants to delay surgery until it is absolutely necessary.  She has no signs of critical limb ischemia like rest pain or tissue loss even though her ABI's have dropped.  We both agree on continued conservative management.  I will see her in 1 year with ABIs.  Discussed she call with any worsening symptoms.  Marty Heck, MD [facility] Office: [phone]

## 2022-11-26 LAB — HM MAMMOGRAPHY

## 2022-12-02 ENCOUNTER — Encounter: Payer: Self-pay | Admitting: Internal Medicine

## 2022-12-10 ENCOUNTER — Telehealth: Payer: Self-pay | Admitting: Hematology and Oncology

## 2022-12-10 NOTE — Telephone Encounter (Signed)
Returned patient's call from new vm system to reschedule 4/11 appointment. Patient rescheduled and notified.

## 2022-12-11 ENCOUNTER — Inpatient Hospital Stay: Payer: 59 | Admitting: Hematology and Oncology

## 2022-12-25 ENCOUNTER — Other Ambulatory Visit: Payer: Self-pay | Admitting: Obstetrics and Gynecology

## 2022-12-25 DIAGNOSIS — E2839 Other primary ovarian failure: Secondary | ICD-10-CM

## 2022-12-25 DIAGNOSIS — F17200 Nicotine dependence, unspecified, uncomplicated: Secondary | ICD-10-CM

## 2022-12-30 ENCOUNTER — Inpatient Hospital Stay: Admission: RE | Admit: 2022-12-30 | Payer: 59 | Source: Ambulatory Visit

## 2023-01-23 ENCOUNTER — Inpatient Hospital Stay: Payer: 59 | Attending: Hematology and Oncology | Admitting: Hematology and Oncology

## 2023-01-23 ENCOUNTER — Inpatient Hospital Stay: Payer: 59

## 2023-01-23 VITALS — BP 128/57 | HR 62 | Temp 97.4°F | Resp 18 | Ht 67.0 in | Wt 162.0 lb

## 2023-01-23 DIAGNOSIS — Z809 Family history of malignant neoplasm, unspecified: Secondary | ICD-10-CM | POA: Diagnosis not present

## 2023-01-23 DIAGNOSIS — I10 Essential (primary) hypertension: Secondary | ICD-10-CM | POA: Insufficient documentation

## 2023-01-23 DIAGNOSIS — Z801 Family history of malignant neoplasm of trachea, bronchus and lung: Secondary | ICD-10-CM | POA: Diagnosis not present

## 2023-01-23 DIAGNOSIS — F17210 Nicotine dependence, cigarettes, uncomplicated: Secondary | ICD-10-CM | POA: Diagnosis not present

## 2023-01-23 DIAGNOSIS — E785 Hyperlipidemia, unspecified: Secondary | ICD-10-CM | POA: Diagnosis not present

## 2023-01-23 DIAGNOSIS — Z780 Asymptomatic menopausal state: Secondary | ICD-10-CM | POA: Diagnosis not present

## 2023-01-23 DIAGNOSIS — D751 Secondary polycythemia: Secondary | ICD-10-CM | POA: Diagnosis not present

## 2023-01-23 DIAGNOSIS — Z800 Family history of malignant neoplasm of digestive organs: Secondary | ICD-10-CM | POA: Insufficient documentation

## 2023-01-23 DIAGNOSIS — Z803 Family history of malignant neoplasm of breast: Secondary | ICD-10-CM | POA: Insufficient documentation

## 2023-01-23 DIAGNOSIS — D72820 Lymphocytosis (symptomatic): Secondary | ICD-10-CM | POA: Insufficient documentation

## 2023-01-23 DIAGNOSIS — Z806 Family history of leukemia: Secondary | ICD-10-CM | POA: Insufficient documentation

## 2023-01-23 LAB — CMP (CANCER CENTER ONLY)
ALT: 14 U/L (ref 0–44)
AST: 18 U/L (ref 15–41)
Albumin: 4.2 g/dL (ref 3.5–5.0)
Alkaline Phosphatase: 74 U/L (ref 38–126)
Anion gap: 5 (ref 5–15)
BUN: 9 mg/dL (ref 8–23)
CO2: 32 mmol/L (ref 22–32)
Calcium: 9.5 mg/dL (ref 8.9–10.3)
Chloride: 104 mmol/L (ref 98–111)
Creatinine: 0.66 mg/dL (ref 0.44–1.00)
GFR, Estimated: >60 mL/min (ref >60)
Glucose, Bld: 111 mg/dL — ABNORMAL HIGH (ref 70–99)
Potassium: 3.7 mmol/L (ref 3.5–5.1)
Sodium: 141 mmol/L (ref 135–145)
Total Bilirubin: 0.4 mg/dL (ref 0.3–1.2)
Total Protein: 6.9 g/dL (ref 6.5–8.1)

## 2023-01-23 LAB — CBC WITH DIFFERENTIAL/PLATELET
Abs Immature Granulocytes: 0.03 10*3/uL (ref 0.00–0.07)
Basophils Absolute: 0.1 10*3/uL (ref 0.0–0.1)
Basophils Relative: 1 %
Eosinophils Absolute: 0.1 10*3/uL (ref 0.0–0.5)
Eosinophils Relative: 1 %
HCT: 46.1 % — ABNORMAL HIGH (ref 36.0–46.0)
Hemoglobin: 15.6 g/dL — ABNORMAL HIGH (ref 12.0–15.0)
Immature Granulocytes: 0 %
Lymphocytes Relative: 33 %
Lymphs Abs: 3.7 10*3/uL (ref 0.7–4.0)
MCH: 29 pg (ref 26.0–34.0)
MCHC: 33.8 g/dL (ref 30.0–36.0)
MCV: 85.7 fL (ref 80.0–100.0)
Monocytes Absolute: 0.9 10*3/uL (ref 0.1–1.0)
Monocytes Relative: 8 %
Neutro Abs: 6.2 10*3/uL (ref 1.7–7.7)
Neutrophils Relative %: 57 %
Platelets: 278 10*3/uL (ref 150–400)
RBC: 5.38 MIL/uL — ABNORMAL HIGH (ref 3.87–5.11)
RDW: 13.8 % (ref 11.5–15.5)
WBC: 11 10*3/uL — ABNORMAL HIGH (ref 4.0–10.5)
nRBC: 0 % (ref 0.0–0.2)

## 2023-01-23 LAB — LACTATE DEHYDROGENASE: LDH: 120 U/L (ref 98–192)

## 2023-01-23 NOTE — Progress Notes (Signed)
[facility] CONSULT NOTE  Patient Care Team: Baxley, Luanna Cole, [name]  CHIEF COMPLAINTS/PURPOSE OF CONSULTATION:  Lymphocytosis  ASSESSMENT & PLAN:   This is a very pleasant 67 year old female patient with hypertension and dyslipidemia initially referred to hematology for evaluation of lymphocytosis.  We also identified that she is at high risk for breast cancer hence we calculated her lifetime risk of breast cancer using TC model and 5-year risk of breast cancer using Gail's model which she has at 38% and 4.6% respectively.  Discussed with her about tamoxifen prevention and she declined it.   For high risk breast cancer screening she will alternate between mammograms and MRIs.  Most recent mammogram with no mammographic evidence of malignancy, BI-RADS Category 2.  Patient would like to proceed with an open MRI if possible, we will see if we can arrange this.  Ideally would like to stagger it hence will anticipate MRI in fall of this year.  Labs previously with some lymphocytosis, flow done previously negative for any monoclonal B cell population.  We will repeat CBC, CMP and LDH today.  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 She will keep [place] posted.  HISTORY OF PRESENTING ILLNESS:   DENYLA TAM 68 y.o. female is here because of lymphocytosis.  This is a very pleasant 67 year old female patient with past medical history significant for hypertension and dyslipidemia who was initially referred to [place] for evaluation of lymphocytosis which was deemed to be polyclonal lymphocytosis now here for follow-up regarding breast cancer risk and to discuss recommendations.  Interval history  She is here for follow up. She denies any new complaints.  No B symptoms. She denies any change in breathing, bowel habits or urinary habits. She continues to smoke 1/2 PPD. No  other complaints yet. No interim hospitalizations or new medications.  MEDICAL HISTORY:  Past Medical History:  Diagnosis Date   Claudication of gluteal region [facility]) 01/18/2018   Elevated homocysteine    Family history of breast cancer 05/20/2021   Family history of pancreatic cancer 05/20/2021   GERD (gastroesophageal reflux disease)    Hyperlipidemia    Hypertension     SURGICAL HISTORY: Past Surgical History:  Procedure Laterality Date   BREAST SURGERY     l breast biopsy   CESAREAN SECTION     TUBAL LIGATION      SOCIAL HISTORY: Social History   Socioeconomic History   Marital status: Married    Spouse name: Not on file   Number of children: 1   Years of education: some college   Highest education level: Not on file  Occupational History   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  Tobacco Use   Smoking status: Every Day    Packs/day: .5    Types: Cigarettes   Smokeless tobacco: Never   Tobacco comments:    10 cigarettes/day  Vaping Use   Vaping Use: Never used  Substance and Sexual Activity   Alcohol use: Yes    Comment: socially   Drug use: No   Sexual activity: Not Currently    Birth control/protection: Post-menopausal  Other Topics Concern   Not on file  Social History Narrative   Lives  W/ husband   Caffeine use: 1x/week coffee   Daily tea   Right handed    Social Determinants of Health   Financial Resource Strain: Not on file  Food Insecurity: Not on file  Transportation Needs: Not on file  Physical Activity: Not on file  Stress: Not on file  Social Connections: Not on file  Intimate Partner Violence: Not on file    FAMILY HISTORY: Family History  Problem Relation Age of Onset   Breast cancer Sister 65   Leukemia Sister 47   Lung cancer Maternal Uncle        dx after 52; no smoking hx   Breast cancer Paternal Aunt 38       x2 paternal aunts   Pancreatic cancer Paternal Uncle        d. 31   Cancer Paternal Uncle        unknown type; dx ~50     ALLERGIES:  is allergic to penicillins.  MEDICATIONS:  Current Outpatient Medications  Medication Sig Dispense Refill   Cholecalciferol (VITAMIN D3) 2000 units TABS Take 1 tablet by mouth daily.     doxycycline (VIBRA-TABS) 100 MG tablet Take 1 tablet (100 mg total) by mouth 2 (two) times daily. 20 tablet 0   hydrochlorothiazide (HYDRODIURIL) 25 MG tablet Take 1 tablet by mouth once daily 90 tablet 3   ibuprofen (ADVIL,MOTRIN) 200 MG tablet Take 200 mg by mouth every 6 (six) hours as needed.     ketoconazole (NIZORAL) 2 % cream Apply 1 application topically daily. 15 g 0   metoprolol succinate (TOPROL-XL) 50 MG 24 hr tablet TAKE 1 TABLET BY MOUTH ONCE DAILY WITH MEALS OR  IMMEDIATELY  FOLLOWING  A  MEAL 90 tablet 3   Polyethylene Glycol 3350 (MIRALAX PO) Take by mouth daily.     pramoxine-hydrocortisone (PROCTOCREAM-HC) 1-1 % rectal cream Place 1 Application rectally 2 (two) times daily. Year supply 30 g 23   predniSONE (DELTASONE) 10 MG tablet Take in tapering course as directed 6-5-4-3-2-1 21 tablet 0   rosuvastatin (CRESTOR) 20 MG tablet Take 1 tablet by mouth once daily 90 tablet 3   Zinc 30 MG CAPS Take 30 mg by mouth daily.     No current facility-administered medications for this visit.    PHYSICAL EXAMINATION:  BP (!) 128/57 (BP Location: Left Arm, Patient Position: Sitting)   Pulse 62   Temp (!) 97.4 F (36.3 C) (Tympanic)   Resp 18   Ht 5\' 7"  (1.702 m)   Wt 162 lb (73.5 kg)   SpO2 98%   BMI 25.37 kg/m    ECOG PERFORMANCE STATUS: 0 - Asymptomatic  Physical Exam Constitutional:      Appearance: Normal appearance.  Cardiovascular:     Rate and Rhythm: Normal rate and regular rhythm.     Pulses: Normal pulses.     Heart sounds: Normal heart sounds.  Pulmonary:     Effort: Pulmonary effort is normal.     Breath sounds: Normal breath sounds.  Chest:     Comments: Bilateral breasts inspected. No palpable masses or regional adenopathy Abdominal:     General:  Abdomen is flat. Bowel sounds are normal. There is no distension.     Palpations: Abdomen is soft.     Tenderness: There is no abdominal tenderness.  Musculoskeletal:     Cervical back: Normal range of motion and neck supple. No rigidity.  Lymphadenopathy:     Cervical: No cervical adenopathy.  Skin:    General: Skin is warm and dry.  Neurological:     General: No focal deficit present.     Mental Status: She is alert.  LABORATORY DATA:  I have reviewed the data as listed Lab Results  Component Value Date   WBC 12.5 (H) 06/24/2022   HGB 16.1 (H) 06/24/2022   HCT 47.1 (H) 06/24/2022   MCV 86.3 06/24/2022   PLT 350 06/24/2022     Chemistry      Component Value Date/Time   NA 140 05/29/2022 1627   K 3.8 05/29/2022 1627   CL 100 05/29/2022 1627   CO2 30 05/29/2022 1627   BUN 9 05/29/2022 1627   CREATININE 0.59 05/29/2022 1627   CREATININE 0.69 06/20/2021 0918      Component Value Date/Time   CALCIUM 9.6 05/29/2022 1627   ALKPHOS 91 05/29/2022 1627   AST 18 05/29/2022 1627   AST 27 04/29/2021 1204   ALT 16 05/29/2022 1627   ALT 30 04/29/2021 1204   BILITOT 0.4 05/29/2022 1627   BILITOT 0.5 04/29/2021 1204     Labs from day pending.  RADIOGRAPHIC STUDIES: I have personally reviewed the radiological images as listed and agreed with the findings in the report. No results found.     Rachel Moulds, MD 01/23/2023 8:53 AM

## 2023-01-29 NOTE — Progress Notes (Signed)
67 y.o. G35P1001 Married White or Caucasian Not Hispanic or Latino female here for annual exam.  No vaginal bleeding.   She has a several month h/o hemorrhoids, bleed intermittently, feels bumps, not painful. She takes miralax daily  Sister with breast cancer at 63, didn't have genetic testing. Died at 72 from leukemia.  TC risk 38%, Gail model 4.6%. Has seen Oncology offered her tamoxifen, she declined. Declined genetic testing. She is having a breast MRI 12 paternal aunts/uncles. 2 aunts out of 7 had breast cancer, in their late 60's at least.     No LMP recorded. Patient is postmenopausal.          Sexually active: No.  The current method of family planning is post menopausal status.    Exercising: Yes.    walk Smoker:  yes, cigarettes.  She has been smoking an average of 0.5 packs per day.  Health Maintenance: Pap:  01/11/22 NILM, HPV negative Pap:  06/18/20 ASCUS HPV +  History of abnormal Pap:  yes  11/21 Colposcopy VAIN 1 MMG:  11/26/22 BI-RADS 2 benign BMD:   scheduled 07/09/23 Colonoscopy: none Cologuard in 11/21 TDaP:  02/14/11, declines Gardasil: n/a   reports that she has been smoking cigarettes. She has been smoking an average of .5 packs per day. She has never used smokeless tobacco. She reports current alcohol use. She reports that she does not use drugs. Very rare ETOH. 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     hydrochlorothiazide (HYDRODIURIL) 25 MG tablet Take 1 tablet by mouth once daily 90 tablet 3   ibuprofen (ADVIL,MOTRIN) 200 MG tablet Take 200 mg by mouth every 6 (six) hours as needed.     ketoconazole (NIZORAL) 2 % cream Apply 1 application topically daily. 15 g 0   metoprolol succinate (TOPROL-XL) 50 MG 24 hr tablet TAKE 1 TABLET BY MOUTH ONCE DAILY WITH MEALS OR  IMMEDIATELY  FOLLOWING  A  MEAL 90 tablet 3   Polyethylene Glycol 3350 (MIRALAX PO) Take by mouth daily.     pramoxine-hydrocortisone (PROCTOCREAM-HC) 1-1 % rectal cream Place 1 Application rectally 2 (two) times daily. Year supply 30 g 23   rosuvastatin (CRESTOR) 20 MG tablet Take 1 tablet by mouth once daily 90 tablet 3   Zinc 30 MG CAPS Take 30 mg by mouth daily.     No current facility-administered medications for this visit.    Family History  Problem Relation Age of Onset   Breast cancer Sister 74   Leukemia Sister 82   Lung cancer Maternal Uncle        dx after 73; no smoking hx   Breast cancer Paternal Aunt 51       x2 paternal aunts   Pancreatic cancer Paternal Uncle        d. 57   Cancer Paternal Uncle  unknown type; dx ~50    Review of Systems  All other systems reviewed and are negative.   Exam:   There were no vitals taken for this visit.  Weight change: @WEIGHTCHANGE @ Height:      Ht Readings from Last 3 Encounters:  01/23/23 5\' 7"  (1.702 m)  09/23/22 5\' 7"  (1.702 m)  06/26/22 5\' 6"  (1.676 m)    General appearance: alert, cooperative and appears stated age Head: Normocephalic, without obvious abnormality, atraumatic Neck: no adenopathy, supple, symmetrical, trachea midline and thyroid normal to inspection and palpation Lungs: clear to auscultation bilaterally Cardiovascular: regular rate and rhythm Breasts: normal appearance, no masses or tenderness Abdomen: soft, non-tender; non distended,  no masses,  no organomegaly Extremities: extremities normal, atraumatic, no  cyanosis or edema Skin: Skin color, texture, turgor normal. No rashes or lesions Lymph nodes: Cervical, supraclavicular, and axillary nodes normal. No abnormal inguinal nodes palpated Neurologic: Grossly normal   Pelvic: External genitalia:  no lesions              Urethra:  normal appearing urethra with no masses, tenderness or lesions              Bartholins and Skenes: normal                 Vagina: normal appearing vagina with normal color and discharge, no lesions              Cervix: no lesions               Bimanual Exam:  Uterus:  normal size, contour, position, consistency, mobility, non-tender              Adnexa: no mass, fullness, tenderness               Rectovaginal: Confirms               Anus:  normal sphincter tone, external hemorrhoid and anal tag noted  Raynelle Fanning, CMA chaperoned for the exam.  1. 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 Screening for cervical cancer - Cytology - PAP  4. Family history of breast cancer She is at elevated risk of breast cancer and is followed at the breast center. Mammogram UTD MRI has been ordered  5. Hemorrhoids, unspecified hemorrhoid type Frequently bleeding, no help with proctofoam.  - Ambulatory referral to General Surgery

## 2023-01-30 ENCOUNTER — Encounter: Payer: Self-pay | Admitting: Obstetrics and Gynecology

## 2023-01-30 ENCOUNTER — Ambulatory Visit (INDEPENDENT_AMBULATORY_CARE_PROVIDER_SITE_OTHER): Payer: 59 | Admitting: Obstetrics and Gynecology

## 2023-01-30 ENCOUNTER — Other Ambulatory Visit (HOSPITAL_COMMUNITY)
Admission: RE | Admit: 2023-01-30 | Discharge: 2023-01-30 | Disposition: A | Discharge status: Home / Self Care | Payer: 59 | Source: Ambulatory Visit | Attending: Obstetrics and Gynecology | Admitting: Obstetrics and Gynecology

## 2023-01-30 VITALS — BP 130/74 | Ht 66.25 in | Wt 159.0 lb

## 2023-01-30 DIAGNOSIS — Z87411 Personal history of vaginal dysplasia: Secondary | ICD-10-CM

## 2023-01-30 DIAGNOSIS — K649 Unspecified hemorrhoids: Secondary | ICD-10-CM | POA: Diagnosis not present

## 2023-01-30 DIAGNOSIS — Z124 Encounter for screening for malignant neoplasm of cervix: Secondary | ICD-10-CM | POA: Insufficient documentation

## 2023-01-30 DIAGNOSIS — Z803 Family history of malignant neoplasm of breast: Secondary | ICD-10-CM | POA: Diagnosis not present

## 2023-01-30 DIAGNOSIS — Z01419 Encounter for gynecological examination (general) (routine) without abnormal findings: Secondary | ICD-10-CM | POA: Diagnosis not present

## 2023-01-30 NOTE — Patient Instructions (Signed)

## 2023-02-04 LAB — CYTOLOGY - PAP
Comment: NEGATIVE
Diagnosis: NEGATIVE
High risk HPV: NEGATIVE

## 2023-02-06 ENCOUNTER — Other Ambulatory Visit: Payer: Self-pay | Admitting: *Deleted

## 2023-02-06 ENCOUNTER — Telehealth: Payer: Self-pay

## 2023-02-06 MED ORDER — HYDROCORTISONE ACE-PRAMOXINE 1-1 % EX CREA: 1.0000 | TOPICAL_CREAM | Freq: Two times a day (BID) | CUTANEOUS | 0 refills | Status: AC

## 2023-02-06 NOTE — Telephone Encounter (Signed)
Insurance prefers analpram instead of proctocream. Is it ok to change?

## 2023-02-06 NOTE — Telephone Encounter (Signed)
Analpram sent in.

## 2023-03-03 ENCOUNTER — Encounter: Payer: Self-pay | Admitting: Gastroenterology

## 2023-03-07 ENCOUNTER — Other Ambulatory Visit: Payer: Self-pay | Admitting: Internal Medicine

## 2023-04-18 ENCOUNTER — Other Ambulatory Visit: Payer: Self-pay | Admitting: Family

## 2023-04-22 ENCOUNTER — Other Ambulatory Visit: Payer: Self-pay

## 2023-04-22 MED ORDER — ROSUVASTATIN CALCIUM 20 MG PO TABS: 20.0000 mg | ORAL_TABLET | Freq: Every day | ORAL | 0 refills | Status: DC

## 2023-05-19 ENCOUNTER — Ambulatory Visit: Payer: 59 | Admitting: Gastroenterology

## 2023-05-19 ENCOUNTER — Other Ambulatory Visit: Payer: Self-pay | Admitting: Internal Medicine

## 2023-05-19 ENCOUNTER — Encounter: Payer: Self-pay | Admitting: Gastroenterology

## 2023-05-19 VITALS — BP 134/78 | HR 65 | Ht 67.0 in | Wt 158.8 lb

## 2023-05-19 DIAGNOSIS — K625 Hemorrhage of anus and rectum: Secondary | ICD-10-CM | POA: Insufficient documentation

## 2023-05-19 MED ORDER — HYDROCORTISONE ACETATE 25 MG RE SUPP: 25.0000 mg | Freq: Every evening | RECTAL | 1 refills | Status: AC

## 2023-05-19 NOTE — Patient Instructions (Addendum)
We have sent the following medications to your pharmacy for you to pick up at your convenience: Hydrocortisone suppository nightly for 10 days.   You have been scheduled for a colonoscopy. Please follow written instructions given to you at your visit today.   Please pick up your prep supplies at the pharmacy within the next 1-3 days.  If you use inhalers (even only as needed), please bring them with you on the day of your procedure.  DO NOT TAKE 7 DAYS PRIOR TO TEST- Trulicity (dulaglutide) Ozempic, Wegovy (semaglutide) Mounjaro (tirzepatide) Bydureon Bcise (exanatide extended release)  DO NOT TAKE 1 DAY PRIOR TO YOUR TEST Rybelsus (semaglutide) Adlyxin (lixisenatide) Victoza (liraglutide) Byetta (exanatide) _______________________________________________________  If your blood pressure at your visit was 140/90 or greater, please contact your primary care physician to follow up on this.  _______________________________________________________  If you are age 26 or older, your body mass index should be between 23-30. Your Body mass index is 24.87 kg/m. If this is out of the aforementioned range listed, please consider follow up with your Primary Care Provider.  If you are age 69 or younger, your body mass index should be between 19-25. Your Body mass index is 24.87 kg/m. If this is out of the aformentioned range listed, please consider follow up with your Primary Care Provider.   ________________________________________________________  The [facility] providers would like to encourage you to use [facility] to communicate with providers for non-urgent requests or questions.  Due to long hold times on the telephone, sending your provider a message by [facility] may be a faster and more efficient way to get a response.  Please allow 48 business hours for a response.  Please remember that this is for non-urgent requests.  _______________________________________________________

## 2023-05-19 NOTE — Progress Notes (Signed)
05/19/2023 Kristy Black [id number] [birth year]   HISTORY OF PRESENT ILLNESS:  This is a 67 year old female who is new to our office.  She is here today for evaluation of rectal bleeding.  She was actually referred here today by Dr. Maisie Fus from [facility] to discuss and schedule colonoscopy.  She says that she has a long-standing history of constipation and hemorrhoids.  She says that she been using Miralax daily for her constipation for about 10 years or more and it has worked Adult nurse for her constipation.  She says that she has had hemorrhoids and she has had intermittent bleeding in the past, but feels like her bleeding has become more often.  She had been given some hydrocortisone cream of some sort and then sent to see surgeon.  She saw Dr. Maisie Fus and was told that her hemorrhoids looked "fine" and that she needed a colonoscopy so was referred here.  She never had colonoscopy in the past, but had a Cologuard almost 3 years ago that was negative.  She is somewhat nervous about having a colonoscopy.   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 She has never used smokeless tobacco. She reports current alcohol use. She reports that she does not use drugs. family history includes Breast cancer (age of onset: 64) in her sister; Breast cancer (age of onset: 40) in her paternal aunt; Cancer in her paternal uncle; Leukemia (age of onset: 29) in her sister; Lung cancer in her maternal uncle; Pancreatic cancer in her paternal uncle. Allergies  Allergen Reactions   Penicillins Hives       Outpatient Encounter Medications as of 05/19/2023  Medication Sig   aspirin EC 81 MG tablet Take 81 mg by mouth daily. Swallow whole.   Cholecalciferol (VITAMIN D3) 2000 units TABS Take 1 tablet by mouth daily.   hydrochlorothiazide (HYDRODIURIL) 25 MG tablet Take 1 tablet by mouth once daily   ibuprofen (ADVIL,MOTRIN) 200 MG tablet Take 200 mg by mouth every 6 (six) hours as needed.   metoprolol succinate (TOPROL-XL) 50 MG 24 hr tablet TAKE 1 TABLET BY MOUTH ONCE DAILY WITH MEALS OR  IMMEDIATELY  FOLLOWING  A  MEAL   Polyethylene Glycol 3350 (MIRALAX PO) Take by mouth daily.   pramoxine-hydrocortisone (ANALPRAM-HC) 1-1 % rectal cream Place 1 Application rectally 2 (two) times daily.   rosuvastatin (CRESTOR) 20 MG tablet Take 1 tablet (20 mg total) by mouth daily.   Zinc 30 MG CAPS Take 30 mg by mouth daily.   No facility-administered encounter medications on file as of 05/19/2023.    REVIEW OF SYSTEMS  : All other systems reviewed and negative except where noted in the History of Present Illness.   PHYSICAL EXAM: BP 134/78   Pulse 65   Ht 5\' 7"  (1.702 m)   Wt 158 lb 12.8 oz (72 kg)   BMI 24.87 kg/m  General: Well developed white female in no acute distress Head: Normocephalic and atraumatic Eyes:  Sclerae anicteric, conjunctiva  pink. Ears: Normal auditory acuity Lungs: Clear throughout to auscultation; no W/R/R. Heart: Regular rate and rhythm; no M/R/G. Rectal:  Will be done at the time of colonoscopy. Musculoskeletal: Symmetrical with no gross deformities  Skin: No lesions on visible extremities Extremities: No edema  Neurological: Alert oriented x 4, grossly non-focal Psychological:  Alert and cooperative. Normal mood and affect  ASSESSMENT AND PLAN: *Rectal bleeding: Intermittent bright red rectal bleeding.  Suspected to be due to hemorrhoids.  Has never had a colonoscopy.  Had a Cologuard almost 3 years ago, but was not having this bleeding at that time.  Will plan  for colonoscopy with Dr. Barron Alvine.  If all else looks well and large internal hemorrhoids are noted then can consider in office banding.  The risks, benefits, and alternatives to colonoscopy were discussed with the patient and she consents to proceed.   Will try hydrocortisone suppositories in the meantime.  Prescription sent to pharmacy.  CC:  Romie Levee, MD

## 2023-05-25 NOTE — Progress Notes (Signed)
Agree with the assessment and plan as outlined by Jessica Zehr, PA-C. ? ?Omesha Bowerman, DO, FACG ? ?

## 2023-05-27 ENCOUNTER — Other Ambulatory Visit: Payer: Self-pay | Admitting: Internal Medicine

## 2023-07-08 ENCOUNTER — Other Ambulatory Visit: Payer: Self-pay | Admitting: Internal Medicine

## 2023-07-09 ENCOUNTER — Inpatient Hospital Stay
Admission: RE | Admit: 2023-07-09 | Discharge: 2023-07-09 | Disposition: A | Discharge status: Home / Self Care | Payer: 59 | Source: Ambulatory Visit | Attending: Obstetrics and Gynecology

## 2023-07-09 DIAGNOSIS — E2839 Other primary ovarian failure: Secondary | ICD-10-CM

## 2023-07-09 DIAGNOSIS — F17200 Nicotine dependence, unspecified, uncomplicated: Secondary | ICD-10-CM

## 2023-07-27 ENCOUNTER — Ambulatory Visit: Payer: 59 | Admitting: Hematology and Oncology

## 2023-08-13 ENCOUNTER — Encounter: Payer: Self-pay | Admitting: Gastroenterology

## 2023-08-13 ENCOUNTER — Ambulatory Visit: Payer: Medicare Other | Admitting: Gastroenterology

## 2023-08-13 VITALS — BP 114/49 | HR 62 | Temp 98.2°F | Resp 18 | Ht 67.0 in | Wt 158.0 lb

## 2023-08-13 DIAGNOSIS — K5730 Diverticulosis of large intestine without perforation or abscess without bleeding: Secondary | ICD-10-CM

## 2023-08-13 DIAGNOSIS — K641 Second degree hemorrhoids: Secondary | ICD-10-CM | POA: Diagnosis not present

## 2023-08-13 DIAGNOSIS — D125 Benign neoplasm of sigmoid colon: Secondary | ICD-10-CM

## 2023-08-13 DIAGNOSIS — K625 Hemorrhage of anus and rectum: Secondary | ICD-10-CM

## 2023-08-13 DIAGNOSIS — D120 Benign neoplasm of cecum: Secondary | ICD-10-CM | POA: Diagnosis not present

## 2023-08-13 DIAGNOSIS — K644 Residual hemorrhoidal skin tags: Secondary | ICD-10-CM

## 2023-08-13 MED ORDER — SODIUM CHLORIDE 0.9 % IV SOLN: 500.0000 mL | INTRAVENOUS | Status: DC

## 2023-08-13 NOTE — Progress Notes (Signed)
GASTROENTEROLOGY PROCEDURE H&P NOTE   Primary Care Physician: Margaree Mackintosh, MD    Reason for Procedure:  Hematochezia  Plan:    Colonoscopy  Patient is appropriate for endoscopic procedure(s) in the ambulatory ([facility]) setting.  The nature of the procedure, as well as the risks, benefits, and alternatives were carefully and thoroughly reviewed with the patient. Ample time for discussion and questions allowed. The patient understood, was satisfied, and agreed to proceed.     HPI: Kristy Black is a 67 y.o. female who presents for colonoscopy for evaluation of hematochezia.  No previous colonoscopy.  Negative Cologuard about 3 years ago.  Otherwise no significant changes in clinical history since last office appointment with Doug Sou on 05/19/2023.  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 Authorizing Provider  aspirin EC 81 MG tablet Take 81 mg by mouth daily. Swallow whole.   Yes [provider]  Cholecalciferol (VITAMIN D3) 2000 units TABS Take 1 tablet by mouth daily.   Yes [provider]  hydrochlorothiazide (HYDRODIURIL) 25 MG tablet Take 1 tablet by mouth once daily 07/08/23  Yes Baxley, Luanna Cole, MD  ibuprofen (ADVIL,MOTRIN) 200 MG tablet Take 200 mg by mouth every 6 (six) hours as needed.   Yes [provider]  metoprolol succinate (TOPROL-XL) 50 MG 24 hr tablet TAKE 1 TABLET BY MOUTH ONCE DAILY WITH MEALS OR  IMMEDIATELY  FOLLOWING  A  MEAL 07/08/23  Yes Baxley, Luanna Cole, MD  Polyethylene  Glycol 3350 (MIRALAX PO) Take by mouth daily.   Yes [provider]  rosuvastatin (CRESTOR) 20 MG tablet Take 1 tablet by mouth once daily 05/28/23  Yes Baxley, Luanna Cole, MD  Zinc 30 MG CAPS Take 30 mg by mouth daily.   Yes [provider]  hydrocortisone (ANUSOL-HC) 25 MG suppository Place 1 suppository (25 mg total) rectally at bedtime. 05/19/23   Zehr, Princella Pellegrini, PA-C  pramoxine-hydrocortisone (ANALPRAM-HC) 1-1 % rectal cream Place 1 Application rectally 2 (two) times daily. Patient not taking: Reported on 08/13/2023 02/06/23   Margaree Mackintosh, MD    Current Outpatient Medications  Medication Sig Dispense Refill   aspirin EC 81 MG tablet Take 81 mg by mouth daily. Swallow whole.     Cholecalciferol (VITAMIN D3) 2000 units TABS Take 1 tablet by mouth daily.     hydrochlorothiazide (HYDRODIURIL) 25 MG tablet Take 1 tablet by mouth once daily 90 tablet 0   ibuprofen (ADVIL,MOTRIN) 200 MG tablet Take 200 mg by mouth every 6 (six) hours as needed.     metoprolol succinate (TOPROL-XL) 50 MG 24 hr tablet TAKE 1 TABLET BY MOUTH ONCE DAILY WITH MEALS OR  IMMEDIATELY  FOLLOWING  A  MEAL 90 tablet 0   Polyethylene Glycol 3350 (MIRALAX PO) Take by mouth daily.     rosuvastatin (CRESTOR) 20 MG tablet Take 1 tablet by mouth once daily 90 tablet 0   Zinc 30 MG CAPS Take 30 mg  by mouth daily.     hydrocortisone (ANUSOL-HC) 25 MG suppository Place 1 suppository (25 mg total) rectally at bedtime. 10 suppository 1   pramoxine-hydrocortisone (ANALPRAM-HC) 1-1 % rectal cream Place 1 Application rectally 2 (two) times daily. (Patient not taking: Reported on 08/13/2023) 30 g 0   Current Facility-Administered Medications  Medication Dose Route Frequency Provider Last Rate Last Admin   0.9 %  sodium chloride infusion  500 mL Intravenous Continuous Donielle Radziewicz V, DO        Allergies as of 08/13/2023 - Review Complete 08/13/2023  Allergen Reaction Noted   Penicillins Hives 01/02/2011    Family  History  Problem Relation Age of Onset   Breast cancer Sister 31   Leukemia Sister 28   Lung cancer Maternal Uncle        dx after 36; no smoking hx   Breast cancer Paternal Aunt 46       x2 paternal aunts   Pancreatic cancer Paternal Uncle        d. 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  Tobacco Use   Smoking status: Every Day    Current packs/day: 0.50    Average packs/day: 0.5 packs/day for 46.9 years (23.5 ttl pk-yrs)    Types: Cigarettes    Start date: 49   Smokeless tobacco: Never   Tobacco comments:    10 cigarettes/day  Vaping Use   Vaping status: Never Used  Substance and Sexual Activity   Alcohol use: Yes    Comment: socially   Drug use: No   Sexual activity: Not Currently    Partners: Male    Birth control/protection: Post-menopausal  Other Topics Concern   Not on file  Social History Narrative   Lives  W/ husband   Caffeine use: 1x/week coffee   Daily tea   Right handed    Social Drivers of Corporate investment banker Strain: Not on file  Food Insecurity: Not on file  Transportation Needs: Not on file  Physical Activity: Not on file  Stress: Not on file  Social Connections: Not on file  Intimate Partner Violence: Not on file    Physical Exam: Vital signs in last 24 hours: @BP  (!) 151/77   Pulse 72   Temp 98.2 F (36.8 C)   Ht 5\' 7"  (1.702 m)   Wt 158 lb (71.7 kg)   SpO2 96%   BMI 24.75 kg/m  GEN: NAD EYE: Sclerae anicteric ENT: MMM CV: Non-tachycardic Pulm: CTA b/l GI: Soft, NT/ND NEURO:  Alert & Oriented x 3   Doristine Locks, DO [facility]   08/13/2023 7:47 AM

## 2023-08-13 NOTE — Progress Notes (Signed)
Pt's states no medical or surgical changes since previsit or office visit. 

## 2023-08-13 NOTE — Progress Notes (Signed)
Called to room to assist during endoscopic procedure.  Patient ID and intended procedure confirmed with present staff. Received instructions for my participation in the procedure from the performing physician.  

## 2023-08-13 NOTE — Progress Notes (Signed)
Vss nad trans to pacu 

## 2023-08-13 NOTE — Op Note (Signed)
[facility] Patient Name: Kristy Black Procedure Date: 08/13/2023 7:57 AM MRN: [id number] Endoscopist: Doristine Locks , MD, [id number] Age: 67 Referring MD:  Date of Birth: 1955-10-24 Gender: Female Account #: [id number] Procedure:                Colonoscopy Indications:              This is the patient's first colonoscopy.                           Episodic, painless hematochezia. Medicines:                Monitored Anesthesia Care Procedure:                Pre-Anesthesia Assessment:                           - Prior to the procedure, a History and Physical                            was performed, and patient medications and                            allergies were reviewed. The patient's tolerance of                            previous anesthesia was also reviewed. The risks                            and benefits of the procedure and the sedation                            options and risks were discussed with the patient.                            All questions were answered, and informed consent                            was obtained. Prior Anticoagulants: The patient has                            taken no anticoagulant or antiplatelet agents. ASA                            Grade Assessment: III - A patient with severe                            systemic disease. After reviewing the risks and                            benefits, the patient was deemed in satisfactory                            condition to undergo the procedure.  After obtaining informed consent, the colonoscope                            was passed under direct vision. Throughout the                            procedure, the patient's blood pressure, pulse, and                            oxygen saturations were monitored continuously. The                            CF [id number] #[id number] was introduced through the anus                            and advanced to the the  terminal ileum. The                            colonoscopy was performed without difficulty. The                            patient tolerated the procedure well. The quality                            of the bowel preparation was good. The terminal                            ileum, ileocecal valve, appendiceal orifice, and                            rectum were photographed. Scope In: 8:05:33 AM Scope Out: 8:26:23 AM Scope Withdrawal Time: 0 hours 16 minutes 7 seconds  Total Procedure Duration: 0 hours 20 minutes 50 seconds  Findings:                 Hemorrhoids were found on perianal exam.                           A 3 mm polyp was found in the cecum. The polyp was                            sessile. The polyp was removed with a cold snare.                            Resection and retrieval were complete. Estimated                            blood loss was minimal.                           Three sessile polyps were found in the sigmoid                            colon. The polyps were 2 to 4  mm in size. These                            polyps were removed with a cold snare. Resection                            and retrieval were complete. Estimated blood loss                            was minimal.                           Multiple medium-mouthed and small-mouthed                            diverticula were found in the sigmoid colon.                           Non-bleeding internal hemorrhoids were found during                            retroflexion. The hemorrhoids were small and Grade                            II (internal hemorrhoids that prolapse but reduce                            spontaneously).                           The terminal ileum appeared normal. Complications:            No immediate complications. Estimated Blood Loss:     Estimated blood loss was minimal. Impression:               - Hemorrhoids found on perianal exam.                           - One 3 mm polyp  in the cecum, removed with a cold                            snare. Resected and retrieved.                           - Three 2 to 4 mm polyps in the sigmoid colon,                            removed with a cold snare. Resected and retrieved.                           - Diverticulosis in the sigmoid colon.                           - Non-bleeding internal hemorrhoids.                           -  The examined portion of the ileum was normal. Recommendation:           - Patient has a contact number available for                            emergencies. The signs and symptoms of potential                            delayed complications were discussed with the                            patient. Return to normal activities tomorrow.                            Written discharge instructions were provided to the                            patient.                           - Resume previous diet.                           - Continue present medications.                           - Await pathology results.                           - Repeat colonoscopy for surveillance based on                            pathology results.                           - Return to GI office PRN.                           - Use fiber, for example Citrucel, Fibercon, Konsyl                            or Metamucil.                           - Internal hemorrhoids were noted on this study and                            may be amenable to hemorrhoid band ligation. If you                            are interested in further treatment of these                            hemorrhoids with band ligation, please contact my  clinic to set up an appointment for evaluation and                            treatment. Doristine Locks, MD 08/13/2023 8:37:06 AM

## 2023-08-13 NOTE — Patient Instructions (Addendum)
Recommendation:           - Patient has a contact number available for                            emergencies. The signs and symptoms of potential                            delayed complications were discussed with the                            patient. Return to normal activities tomorrow.                            Written discharge instructions were provided to the                            patient.                           - Resume previous diet.                           - Continue present medications.                           - Await pathology results.                           - Repeat colonoscopy for surveillance based on                            pathology results.                           - Return to GI office PRN.                           - Use fiber, for example Citrucel, Fibercon, Konsyl                            or Metamucil.                           - Internal hemorrhoids were noted on this study and                            may be amenable to hemorrhoid band ligation. If you                            are interested in further treatment of these                            hemorrhoids with band ligation, please contact my                            clinic to set up an appointment  for evaluation and                            treatment.  Handouts on polyps, hemorrhoids and diverticulosis given.  YOU HAD AN ENDOSCOPIC PROCEDURE TODAY AT THE [facility]:   Refer to the procedure report that was given to you for any specific questions about what was found during the examination.  If the procedure report does not answer your questions, please call your gastroenterologist to clarify.  If you requested that your care partner not be given the details of your procedure findings, then the procedure report has been included in a sealed envelope for you to review at your convenience later.  YOU SHOULD EXPECT: Some feelings of bloating in the abdomen. Passage of  more gas than usual.  Walking can help get rid of the air that was put into your GI tract during the procedure and reduce the bloating. If you had a lower endoscopy (such as a colonoscopy or flexible sigmoidoscopy) you may notice spotting of blood in your stool or on the toilet paper. If you underwent a bowel prep for your procedure, you may not have a normal bowel movement for a few days.  Please Note:  You might notice some irritation and congestion in your nose or some drainage.  This is from the oxygen used during your procedure.  There is no need for concern and it should clear up in a day or so.  SYMPTOMS TO REPORT IMMEDIATELY:  Following lower endoscopy (colonoscopy or flexible sigmoidoscopy):  Excessive amounts of blood in the stool  Significant tenderness or worsening of abdominal pains  Swelling of the abdomen that is new, acute  Fever of 100F or higher   For urgent or emergent issues, a gastroenterologist can be reached at any hour by calling [phone]. Do not use MyChart messaging for urgent concerns.    DIET:  We do recommend a small meal at first, but then you may proceed to your regular diet.  Drink plenty of fluids but you should avoid alcoholic beverages for 24 hours.  ACTIVITY:  You should plan to take it easy for the rest of today and you should NOT DRIVE or use heavy machinery until tomorrow (because of the sedation medicines used during the test).    FOLLOW UP: Our staff will call the number listed on your records the next business day following your procedure.  We will call around 7:15- 8:00 am to check on you and address any questions or concerns that you may have regarding the information given to you following your procedure. If we do not reach you, we will leave a message.     If any biopsies were taken you will be contacted by phone or by letter within the next 1-3 weeks.  Please call us at [phone] if you have not heard about the biopsies in 3 weeks.     SIGNATURES/CONFIDENTIALITY: You and/or your care partner have signed paperwork which will be entered into your electronic medical record.  These signatures attest to the fact that that the information above on your After Visit Summary has been reviewed and is understood.  Full responsibility of the confidentiality of this discharge information lies with you and/or your care-partner.

## 2023-08-14 ENCOUNTER — Telehealth: Payer: Self-pay

## 2023-08-14 NOTE — Telephone Encounter (Signed)
  Follow up Call-     08/13/2023    7:30 AM  Call back number  Post procedure Call Back phone  # [phone]  Permission to leave phone message Yes     Follow up call, LVM

## 2023-08-18 LAB — SURGICAL PATHOLOGY

## 2023-08-19 ENCOUNTER — Inpatient Hospital Stay: Payer: Medicare Other | Attending: Hematology and Oncology | Admitting: Hematology and Oncology

## 2023-08-19 VITALS — BP 145/47 | HR 65 | Temp 97.7°F | Resp 18 | Wt 160.1 lb

## 2023-08-19 DIAGNOSIS — Z7982 Long term (current) use of aspirin: Secondary | ICD-10-CM | POA: Insufficient documentation

## 2023-08-19 DIAGNOSIS — M8580 Other specified disorders of bone density and structure, unspecified site: Secondary | ICD-10-CM | POA: Diagnosis not present

## 2023-08-19 DIAGNOSIS — Z9189 Other specified personal risk factors, not elsewhere classified: Secondary | ICD-10-CM

## 2023-08-19 DIAGNOSIS — F17210 Nicotine dependence, cigarettes, uncomplicated: Secondary | ICD-10-CM | POA: Insufficient documentation

## 2023-08-19 DIAGNOSIS — D72820 Lymphocytosis (symptomatic): Secondary | ICD-10-CM | POA: Insufficient documentation

## 2023-08-19 DIAGNOSIS — Z79899 Other long term (current) drug therapy: Secondary | ICD-10-CM | POA: Diagnosis not present

## 2023-08-19 NOTE — Progress Notes (Signed)
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  We also identified that she is at high risk for breast cancer hence we calculated her lifetime risk of breast cancer using TC model and 5-year risk of breast cancer using Gail's model which she has at 38% and 4.6% respectively.  She did not want to consider antiestrogen therapy for prophylaxis.  She wanted to continue intensive screening.  Breast Cancer Surveillance No new concerns on physical examination. Discussed the option of contrast-enhanced mammogram for improved imaging. -Order contrast-enhanced mammogram for end of March 2025. -She is not so sure about MRI's. She might consider one every other yr.  Mild Osteopenia T-score -1.3 on recent bone density scan. Patient is already taking Vitamin D3 and has adequate calcium intake. -Continue current regimen and encourage weight-bearing exercises.  Colon Polyps Recent colonoscopy revealed four small, benign, possibly precancerous polyps (tubular adenomas) which were removed. -Repeat colonoscopy in 5 years as recommended by gastroenterologist.  Daughter's Skin Lesion Reviewed pathology report of daughter's removed mole, which showed atypia but not melanoma. -Reassured patient   General Health Maintenance -Continue annual screenings as appropriate. -Draw blood for routine labs today. -Follow-up in 1 year if no new concerns arise.   HISTORY OF PRESENTING ILLNESS:   Kristy Black 67 y.o. female is here because for follow-up given her high risk for breast cancer.  Discussed the use of AI scribe software for clinical note transcription with the patient, who gave verbal consent to  proceed.  History of Present Illness    The patient, with a high risk of breast cancer, presents for a routine follow-up. She has been proactive in her health maintenance, recently undergoing a mammogram, bone density scan, and colonoscopy.  The patient declined tamoxifen for her breast cancer and has been managing claustrophobia, which has limited her imaging options. She is considering a contrast-enhanced mammogram as an alternative to an open MRI.  Additionally, the patient's daughter recently had a mole removed, which was found to have atypia but was not melanoma. The margins were negative, and a follow-up is planned in six months. She had a few questions about this.   MEDICAL HISTORY:  Past Medical History:  Diagnosis Date   Claudication of gluteal region [facility]) 01/18/2018   Elevated homocysteine    Family history of breast cancer 05/20/2021   Family history of pancreatic cancer 05/20/2021   GERD (gastroesophageal reflux disease)    Hyperlipidemia    Hypertension     SURGICAL HISTORY: Past Surgical History:  Procedure Laterality Date   BREAST SURGERY     l breast biopsy   CESAREAN SECTION     TUBAL LIGATION      SOCIAL HISTORY: Social History   Socioeconomic History   Marital status: Married    Spouse name: Not on file   Number of children: 1   Years of education: some college   Highest education level: Not on file  Occupational History   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  Tobacco Use   Smoking status: Every Day    Current packs/day: 0.50    Average packs/day: 0.5 packs/day for 47.0 years (23.5 ttl pk-yrs)    Types: Cigarettes    Start date: 77   Smokeless tobacco: Never  Tobacco comments:    10 cigarettes/day  Vaping Use   Vaping status: Never Used  Substance and Sexual Activity   Alcohol use: Yes    Comment: socially   Drug use: No   Sexual activity: Not Currently    Partners: Male    Birth control/protection: Post-menopausal  Other Topics Concern   Not on  file  Social History Narrative   Lives  W/ husband   Caffeine use: 1x/week coffee   Daily tea   Right handed    Social Drivers of Corporate investment banker Strain: Not on file  Food Insecurity: Not on file  Transportation Needs: Not on file  Physical Activity: Not on file  Stress: Not on file  Social Connections: Not on file  Intimate Partner Violence: Not on file    FAMILY HISTORY: Family History  Problem Relation Age of Onset   Breast cancer Sister 73   Leukemia Sister 64   Lung cancer Maternal Uncle        dx after 47; no smoking hx   Breast cancer Paternal Aunt 68       x2 paternal aunts   Pancreatic cancer Paternal Uncle        d. 62   Cancer Paternal Uncle        unknown type; dx ~50   Colon cancer Neg Hx    Rectal cancer Neg Hx    Stomach cancer Neg Hx     ALLERGIES:  is allergic to penicillins.  MEDICATIONS:  Current Outpatient Medications  Medication Sig Dispense Refill   aspirin EC 81 MG tablet Take 81 mg by mouth daily. Swallow whole.     Cholecalciferol (VITAMIN D3) 2000 units TABS Take 1 tablet by mouth daily.     hydrochlorothiazide (HYDRODIURIL) 25 MG tablet Take 1 tablet by mouth once daily 90 tablet 0   hydrocortisone (ANUSOL-HC) 25 MG suppository Place 1 suppository (25 mg total) rectally at bedtime. 10 suppository 1   ibuprofen (ADVIL,MOTRIN) 200 MG tablet Take 200 mg by mouth every 6 (six) hours as needed.     metoprolol succinate (TOPROL-XL) 50 MG 24 hr tablet TAKE 1 TABLET BY MOUTH ONCE DAILY WITH MEALS OR  IMMEDIATELY  FOLLOWING  A  MEAL 90 tablet 0   Polyethylene Glycol 3350 (MIRALAX PO) Take by mouth daily.     pramoxine-hydrocortisone (ANALPRAM-HC) 1-1 % rectal cream Place 1 Application rectally 2 (two) times daily. (Patient not taking: Reported on 08/13/2023) 30 g 0   rosuvastatin (CRESTOR) 20 MG tablet Take 1 tablet by mouth once daily 90 tablet 0   Zinc 30 MG CAPS Take 30 mg by mouth daily.     No current facility-administered  medications for this visit.    PHYSICAL EXAMINATION:  There were no vitals taken for this visit.   ECOG PERFORMANCE STATUS: 0 - Asymptomatic  Physical Exam Constitutional:      Appearance: Normal appearance.  Cardiovascular:     Rate and Rhythm: Normal rate and regular rhythm.     Pulses: Normal pulses.     Heart sounds: Normal heart sounds.  Pulmonary:     Effort: Pulmonary effort is normal.     Breath sounds: Normal breath sounds.  Chest:     Comments: Bilateral breasts inspected. No palpable masses or regional adenopathy Abdominal:     General: Abdomen is flat. Bowel sounds are normal. There is no distension.     Palpations: Abdomen is soft.     Tenderness: There is  no abdominal tenderness.  Musculoskeletal:     Cervical back: Normal range of motion and neck supple. No rigidity.  Lymphadenopathy:     Cervical: No cervical adenopathy.  Skin:    General: Skin is warm and dry.  Neurological:     General: No focal deficit present.     Mental Status: She is alert.      LABORATORY DATA:  I have reviewed the data as listed Lab Results  Component Value Date   WBC 11.0 (H) 01/23/2023   HGB 15.6 (H) 01/23/2023   HCT 46.1 (H) 01/23/2023   MCV 85.7 01/23/2023   PLT 278 01/23/2023     Chemistry      Component Value Date/Time   NA 141 01/23/2023 0912   K 3.7 01/23/2023 0912   CL 104 01/23/2023 0912   CO2 32 01/23/2023 0912   BUN 9 01/23/2023 0912   CREATININE 0.66 01/23/2023 0912   CREATININE 0.69 06/20/2021 0918      Component Value Date/Time   CALCIUM 9.5 01/23/2023 0912   ALKPHOS 74 01/23/2023 0912   AST 18 01/23/2023 0912   ALT 14 01/23/2023 0912   BILITOT 0.4 01/23/2023 0912     Labs from day pending.  RADIOGRAPHIC STUDIES: I have personally reviewed the radiological images as listed and agreed with the findings in the report. No results found.     Rachel Moulds, MD 08/19/2023 11:39 AM

## 2023-08-30 ENCOUNTER — Other Ambulatory Visit: Payer: Self-pay | Admitting: Internal Medicine

## 2023-09-18 ENCOUNTER — Other Ambulatory Visit: Payer: Self-pay

## 2023-09-18 DIAGNOSIS — I70213 Atherosclerosis of native arteries of extremities with intermittent claudication, bilateral legs: Secondary | ICD-10-CM

## 2023-09-29 ENCOUNTER — Ambulatory Visit (HOSPITAL_COMMUNITY)
Admission: RE | Admit: 2023-09-29 | Discharge: 2023-09-29 | Disposition: A | Discharge status: Home / Self Care | Payer: Medicare Other | Source: Ambulatory Visit | Attending: Vascular Surgery | Admitting: Vascular Surgery

## 2023-09-29 ENCOUNTER — Other Ambulatory Visit: Payer: Medicare Other

## 2023-09-29 ENCOUNTER — Ambulatory Visit (INDEPENDENT_AMBULATORY_CARE_PROVIDER_SITE_OTHER): Payer: Medicare Other | Admitting: Vascular Surgery

## 2023-09-29 ENCOUNTER — Encounter: Payer: Self-pay | Admitting: Vascular Surgery

## 2023-09-29 VITALS — BP 145/74 | HR 61 | Temp 97.7°F | Resp 18 | Ht 67.0 in | Wt 158.7 lb

## 2023-09-29 DIAGNOSIS — I70213 Atherosclerosis of native arteries of extremities with intermittent claudication, bilateral legs: Secondary | ICD-10-CM

## 2023-09-29 DIAGNOSIS — Z0000 Encounter for general adult medical examination without abnormal findings: Secondary | ICD-10-CM

## 2023-09-29 DIAGNOSIS — E782 Mixed hyperlipidemia: Secondary | ICD-10-CM

## 2023-09-29 DIAGNOSIS — E7800 Pure hypercholesterolemia, unspecified: Secondary | ICD-10-CM

## 2023-09-29 DIAGNOSIS — I10 Essential (primary) hypertension: Secondary | ICD-10-CM

## 2023-09-29 DIAGNOSIS — I708 Atherosclerosis of other arteries: Secondary | ICD-10-CM

## 2023-09-29 LAB — VAS US ABI WITH/WO TBI
Left ABI: 0.27
Right ABI: 0.3

## 2023-09-29 NOTE — Progress Notes (Signed)
Patient name: Kristy Black MRN: [id number] DOB: Nov 10, 1955 Sex: female  REASON FOR VISIT: 1 year follow-up for bilateral lower extremity claudication  HPI: Kristy Black is a 68 y.o. female with history of hypertension, hyperlipidemia, tobacco abuse that presents for 1 year follow-up for bilateral lower extremity buttock claudication.  She has previously been followed by Dr. Arbie Cookey.   She feels that her symptoms are stable over the past year.  States she can walk about 200-300 feet without stopping.  Smoking half a pack to three quarters of a pack a day..  No classic rest pain symptoms at night.  No open wounds.  Able to do all of her duties at work.  Overall feels things are stable.  If walking long distances gets buttock claudication.  Past Medical History:  Diagnosis Date   Claudication of gluteal region [facility]) 01/18/2018   Elevated homocysteine    Family history of breast cancer 05/20/2021   Family history of pancreatic cancer 05/20/2021   GERD (gastroesophageal reflux disease)    Hyperlipidemia    Hypertension     Past Surgical History:  Procedure Laterality Date   BREAST SURGERY     l breast biopsy   CESAREAN SECTION     TUBAL LIGATION      Family History  Problem Relation Age of Onset   Breast cancer Sister 52   Leukemia Sister 12   Lung cancer Maternal Uncle        dx after 43; no smoking hx   Breast cancer Paternal Aunt 54       x2 paternal aunts   Pancreatic cancer Paternal Uncle        d. 66   Cancer Paternal Uncle        unknown type; dx ~50   Colon cancer Neg Hx    Rectal cancer Neg Hx    Stomach cancer Neg Hx     SOCIAL HISTORY: Social History   Tobacco Use   Smoking status: Every Day    Current packs/day: 0.50    Average packs/day: 0.5 packs/day for 47.1 years (23.5 ttl pk-yrs)    Types: Cigarettes    Start date: 59   Smokeless tobacco: Never   Tobacco comments:    10 cigarettes/day  Substance Use Topics   Alcohol use: Yes    Comment:  socially    Allergies  Allergen Reactions   Penicillins Hives    Current Outpatient Medications  Medication Sig Dispense Refill   aspirin EC 81 MG tablet Take 81 mg by mouth daily. Swallow whole.     Cholecalciferol (VITAMIN D3) 2000 units TABS Take 1 tablet by mouth daily.     hydrochlorothiazide (HYDRODIURIL) 25 MG tablet Take 1 tablet by mouth once daily 90 tablet 0   hydrocortisone (ANUSOL-HC) 25 MG suppository Place 1 suppository (25 mg total) rectally at bedtime. 10 suppository 1   ibuprofen (ADVIL,MOTRIN) 200 MG tablet Take 200 mg by mouth every 6 (six) hours as needed.     metoprolol succinate (TOPROL-XL) 50 MG 24 hr tablet TAKE 1 TABLET BY MOUTH ONCE DAILY WITH MEALS OR  IMMEDIATELY  FOLLOWING  A  MEAL 90 tablet 0   Polyethylene Glycol 3350 (MIRALAX PO) Take by mouth daily.     pramoxine-hydrocortisone (ANALPRAM-HC) 1-1 % rectal cream Place 1 Application rectally 2 (two) times daily. (Patient not taking: Reported on 08/13/2023) 30 g 0   rosuvastatin (CRESTOR) 20 MG tablet Take 1 tablet by mouth once daily 90 tablet 0  Zinc 30 MG CAPS Take 30 mg by mouth daily.     No current facility-administered medications for this visit.    REVIEW OF SYSTEMS:  [X]  denotes positive finding, [ ]  denotes negative finding Cardiac  Comments:  Chest pain or chest pressure:    Shortness of breath upon exertion:    Short of breath when lying flat:    Irregular heart rhythm:        Vascular    Pain in calf, thigh, or hip brought on by ambulation: x   Pain in feet at night that wakes you up from your sleep:     Blood clot in your veins:    Leg swelling:         Pulmonary    Oxygen at home:    Productive cough:     Wheezing:         Neurologic    Sudden weakness in arms or legs:     Sudden numbness in arms or legs:     Sudden onset of difficulty speaking or slurred speech:    Temporary loss of vision in one eye:     Problems with dizziness:         Gastrointestinal    Blood in  stool:     Vomited blood:         Genitourinary    Burning when urinating:     Blood in urine:        Psychiatric    Major depression:         Hematologic    Bleeding problems:    Problems with blood clotting too easily:        Skin    Rashes or ulcers:        Constitutional    Fever or chills:      PHYSICAL EXAM: There were no vitals filed for this visit.   GENERAL: The patient is a well-nourished female, in no acute distress. The vital signs are documented above. CARDIAC: There is a regular rate and rhythm.  VASCULAR:  No palpable femoral pulses No active tissue loss PULMONARY: No respiratory distress. ABDOMEN: Soft and non-tender. MUSCULOSKELETAL: There are no major deformities or cyanosis. NEUROLOGIC: No focal weakness or paresthesias are detected. SKIN: There are no ulcers or rashes noted. PSYCHIATRIC: The patient has a normal affect.  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  Assessment/Plan:  68 year old female with history of tobacco abuse that presents for 1 year follow-up of her bilateral lower extremity buttock claudication.  Prior CTA showed large calcified plaque in the distal abdominal aorta as well as significant bilateral common iliac artery disease from years ago when Dr. Arbie Cookey was following her PAD.  She feels her claudication symptoms are stable over the past year and walking 200- 300 feet without stopping.  She again has no signs of critical limb ischemia like rest pain or tissue loss even though her ABI's are severely depressed.  We both agree on continued conservative management.  I will see her in 1 year with ABIs.  Discussed she call with any worsening symptoms.  Cephus Shelling, MD [facility] Office: [phone]

## 2023-09-30 ENCOUNTER — Encounter: Payer: Self-pay | Admitting: Internal Medicine

## 2023-09-30 LAB — LIPID PANEL
Cholesterol: 124 mg/dL (ref <200)
HDL: 38 mg/dL — ABNORMAL LOW (ref >=50)
LDL Cholesterol (Calc): 61 mg/dL
Non-HDL Cholesterol (Calc): 86 mg/dL (ref <130)
Total CHOL/HDL Ratio: 3.3 (calc) (ref <5.0)
Triglycerides: 175 mg/dL — ABNORMAL HIGH (ref <150)

## 2023-09-30 LAB — CBC WITH DIFFERENTIAL/PLATELET
Absolute Lymphocytes: 3666 {cells}/uL (ref 850–3900)
Absolute Monocytes: 847 {cells}/uL (ref 200–950)
Basophils Absolute: 46 {cells}/uL (ref 0–200)
Basophils Relative: 0.4 %
Eosinophils Absolute: 116 {cells}/uL (ref 15–500)
Eosinophils Relative: 1 %
HCT: 46.4 % — ABNORMAL HIGH (ref 35.0–45.0)
Hemoglobin: 15 g/dL (ref 11.7–15.5)
MCH: 27.9 pg (ref 27.0–33.0)
MCHC: 32.3 g/dL (ref 32.0–36.0)
MCV: 86.4 fL (ref 80.0–100.0)
MPV: 9.8 fL (ref 7.5–12.5)
Monocytes Relative: 7.3 %
Neutro Abs: 6925 {cells}/uL (ref 1500–7800)
Neutrophils Relative %: 59.7 %
Platelets: 311 10*3/uL (ref 140–400)
RBC: 5.37 10*6/uL — ABNORMAL HIGH (ref 3.80–5.10)
RDW: 12.9 % (ref 11.0–15.0)
Total Lymphocyte: 31.6 %
WBC: 11.6 10*3/uL — ABNORMAL HIGH (ref 3.8–10.8)

## 2023-09-30 LAB — COMPLETE METABOLIC PANEL WITH GFR
AG Ratio: 1.5 (calc) (ref 1.0–2.5)
ALT: 16 U/L (ref 6–29)
AST: 19 U/L (ref 10–35)
Albumin: 4.3 g/dL (ref 3.6–5.1)
Alkaline phosphatase (APISO): 91 U/L (ref 37–153)
BUN: 12 mg/dL (ref 7–25)
CO2: 29 mmol/L (ref 20–32)
Calcium: 9.7 mg/dL (ref 8.6–10.4)
Chloride: 100 mmol/L (ref 98–110)
Creat: 0.62 mg/dL (ref 0.50–1.05)
Globulin: 2.8 g/dL (ref 1.9–3.7)
Glucose, Bld: 101 mg/dL — ABNORMAL HIGH (ref 65–99)
Potassium: 3.9 mmol/L (ref 3.5–5.3)
Sodium: 140 mmol/L (ref 135–146)
Total Bilirubin: 0.6 mg/dL (ref 0.2–1.2)
Total Protein: 7.1 g/dL (ref 6.1–8.1)
eGFR: 99 mL/min/{1.73_m2} (ref >=60)

## 2023-09-30 LAB — TSH: TSH: 2.51 m[IU]/L (ref 0.40–4.50)

## 2023-09-30 NOTE — Progress Notes (Addendum)
 Welcome To Medicare   Patient Care Team: Margaree Mackintosh, [name]  Visit Date: 10/05/23   Chief Complaint  Patient presents with   Medicare Wellness    Medicare Wellness   Subjective:  Patient: Kristy Black, Female DOB: 17-Apr-1956, 68 y.o. MRN: [id number]  Kristy Black is a 68 y.o. Female who presents today for her Welcome to [facility] Wellness visit. Patient has history of smoking, hypertension, constipation treated with MiraLAX, anxiety, hyperlipidemia and GE reflux.  Reports that she woke up this morning with sinus congestion and post-nasal drip. Mucus is clear. Denies fever or wheezing.    History of Smoking, 40+ years. Has cut back considerably but continues to smoke. Cessation has previously been discussed with her. Lung cancer screenings recommended, especially due to fhx of lung cancer w/o risk fx.   Fhx of Heart Disease. CT Cardiac Scoring 07/08/22 with a score of 165. Subsequent findings include Mild emphysematous changes but no acute pulmonary findings or worrisome pulmonary lesions; Scattered aortic and coronary artery calcifications; Small hiatal hernia; Aortic atherosclerosis.   History of Lymphocytosis, followed by Rachel Moulds, MD with oncology every 6 months. Previous flow cytometry showed polyclonal lymphocytosis. No evidence of monoclonal B-cell population.   History of Hypertension treated with 25 mg Hydrochlorothiazide daily and 50 mg Metoprolol succinate daily. Blood Pressure: normotensive at 130/74.   History of Hyperlipidemia treated with 20 mg Rosuvastatin daily. 09/29/23 Lipid Panel, compared to 06/24/22: HDL low at 38, decreased from 40, consistent with longstanding history; TRIG 175, decreased from 218 but still elevated.    Labs 09/29/23 CBC, compared to 01/23/23: WBC 11.6, elevated further from 11.0; RBC 5.37, decreased from 5.38 but still elevated; HCT 46.4, increased from 46.1. CMP, compared to 01/23/23: Blood Glucose  101, decreased from 111 but still elevated; otherwise WNL TSH: 2.51  PAP Smear 01/30/23 normal with repeat recommendation of 2027.  12/10/22 Mammogram was normal. Has a 38% lifetime risk of breast cancer, with fhx in her sister and hx of fibroadenoma removed from both breasts in the past - has previously discussed Tamoxifen which she has declined, as well as MRI Mammogram screenings. She reports that her future mammograms have been changed to contrast-enhanced mammograms for better evaluations per Dr. Al Pimple.   Colonoscopy 08/13/23 with external/non-bleeding internal (small, grade II) hemorrhoids; 4 polyps removed - one 3 mm sessile in cecum & three 2-4 mm sessile in sigmoid colon (all found to be benign tubular adenomas); Multiple medium & small-mouthed diverticula found in sigmoid with repeat recommendation of 2029. Uses Analpram-HC or Anusol-HC suppositories rectal cream to manage hemorrhoids. Takes cap-full of Miralax daily for constipation.   Bone Density 07/09/23 with a T-score of -1.3, osteopenic, with repeat recommendation of 2026. Takes 2000 units Vitamin-D3 daily.   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   History of left breast fibroadenoma removed in 1995.  Right breast fibroadenoma removed in 2000.  C-section 1991.  Bilateral tubal ligation 1995.  Benign left breast biopsy 2007.   She has had considerable issues with right  hip and buttock pain.  She had negative MRI of the LS spine and negative hip films.  Has been followed by vascular surgery for vascular  disease.  Had MRI of the LS spine in 2018 that did not show spinal stenosis.  No neural impingement was noted or even disc herniation.  The pain bothers her at night and interferes with her sleep.  She has seen Dr. Chestine Spore, vascular surgeon for evaluation of this issue.  She has claudication issues and has known right common iliac artery occlusion.  Right ABI command index indicates severe right lower extremity arterial disease.  Left ABI indicated severe left lower extremity arterial disease. Family History  Problem Relation Age of Onset   Breast cancer Sister 72   Leukemia Sister 88   Lung cancer Maternal Uncle        dx after 81; no smoking hx   Breast cancer Paternal Aunt 62       x2 paternal aunts   Pancreatic cancer Paternal Uncle        d. 58   Cancer Paternal Uncle        unknown type; dx ~50   Colon cancer Neg Hx    Rectal cancer Neg Hx    Stomach cancer Neg Hx    Social History   Social History Narrative   She retired from [employer]. 1 grandchild. 1 adult daughter. She is married. 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   Lives  W/ husband   Caffeine use: 1x/week coffee   Daily tea   Right handed    Review of Systems  Constitutional:  Negative for chills, fever, malaise/fatigue and weight loss.  HENT:  Positive for congestion. Negative for hearing loss, sinus pain and sore throat.        (+) Post-nasal drip  Respiratory:  Negative for cough, hemoptysis and shortness of breath.   Cardiovascular:  Negative for chest pain, palpitations, leg swelling and PND.  Gastrointestinal:  Negative for abdominal pain, constipation, diarrhea, heartburn, nausea and vomiting.  Genitourinary:  Negative for dysuria, frequency and urgency.  Musculoskeletal:  Negative for back pain, myalgias and neck pain.  Skin:  Negative for itching and rash.  Neurological:  Negative for dizziness, tingling, seizures and headaches.  Endo/Heme/Allergies:  Negative for  polydipsia.  Psychiatric/Behavioral:  Negative for depression. The patient is not nervous/anxious.     Objective:  Vitals: BP 130/74   Pulse 71   Temp 97.9 F (36.6 C) (Oral)   Resp 16   Ht 5\' 7"  (1.702 m)   Wt 157 lb 6.4 oz (71.4 kg)   SpO2 95%   BMI 24.65 kg/m  Physical Exam Vitals and nursing note reviewed.  Constitutional:      General: She is not in acute distress.    Appearance: Normal appearance. She is not ill-appearing or toxic-appearing.  HENT:     Head: Normocephalic and atraumatic.     Right Ear: Hearing, tympanic membrane, ear canal and external ear normal. No decreased hearing noted. There is impacted cerumen.     Left Ear: Hearing, tympanic membrane, ear canal and external ear normal. No decreased hearing noted. There is impacted cerumen.     Mouth/Throat:     Pharynx: Oropharynx is clear.  Eyes:     Extraocular Movements: Extraocular movements intact.     Pupils: Pupils are equal, round, and reactive to light.  Neck:     Thyroid: No thyroid mass, thyromegaly or thyroid tenderness.  Vascular: No carotid bruit.  Cardiovascular:     Rate and Rhythm: Normal rate and regular rhythm. No extrasystoles are present.    Pulses:          Dorsalis pedis pulses are 1+ on the right side and 1+ on the left side.     Heart sounds: Normal heart sounds. No murmur heard.    No friction rub. No gallop.  Pulmonary:     Effort: Pulmonary effort is normal.     Breath sounds: Normal breath sounds. No decreased breath sounds, wheezing, rhonchi or rales.  Chest:     Chest wall: No mass.  Abdominal:     Palpations: Abdomen is soft. There is no hepatomegaly, splenomegaly or mass.     Tenderness: There is no abdominal tenderness.     Hernia: No hernia is present.  Musculoskeletal:     Cervical back: Normal range of motion.     Right lower leg: No edema.     Left lower leg: No edema.  Lymphadenopathy:     Cervical: No cervical adenopathy.     Upper Body:     Right upper  body: No supraclavicular adenopathy.     Left upper body: No supraclavicular adenopathy.  Skin:    General: Skin is warm and dry.  Neurological:     General: No focal deficit present.     Mental Status: She is alert and oriented to person, place, and time. Mental status is at baseline.     Sensory: Sensation is intact.     Motor: Motor function is intact. No weakness.     Deep Tendon Reflexes: Reflexes are normal and symmetric.  Psychiatric:        Attention and Perception: Attention normal.        Mood and Affect: Mood normal.        Speech: Speech normal.        Behavior: Behavior normal.        Thought Content: Thought content normal.        Cognition and Memory: Cognition normal.        Judgment: Judgment normal.   Most Recent Fall Risk Assessment:    10/05/2023   11:08 AM  Fall Risk   Falls in the past year? 0  Number falls in past yr: 0  Injury with Fall? 0  Follow up Falls evaluation completed   Most Recent Depression Screenings:    10/05/2023   11:08 AM 07/15/2022    4:03 PM  PHQ 2/9 Scores  PHQ - 2 Score 0 0  PHQ- 9 Score 0    Results:  Studies obtained and personally reviewed by me:  Coronary Calcium Score  Non-cardiac: No significant non cardiac findings on limited lung and soft tissue windows. See separate report from [facility].   Ascending Aorta: Normal diameter 2.9 cm with atherosclerosis   Pericardium: Normal   Coronary arteries: 3 vessel calcium noted   LM 0   LAD 42.3   LCX 6.51   RCA 115   Total 164   IMPRESSION: Coronary calcium score of 164. This was 17 th percentile for age and sex matched control.  CT CHEST  FINDINGS: The ascending thoracic aorta is normal in caliber. Scattered aortic and coronary artery calcifications.   No mediastinal or hilar mass or lymphadenopathy. The esophagus is grossly. There is a small hiatal hernia noted.   No acute pulmonary findings or worrisome pulmonary lesions. No pleural effusion.  Mild emphysematous changes are noted.  No significant upper abdominal findings.   No significant bony findings.   IMPRESSION: 1. Mild emphysematous changes but no acute pulmonary findings or worrisome pulmonary lesions. 2. Scattered aortic and coronary artery calcifications. 3. Small hiatal hernia. 4. Aortic atherosclerosis.  PAP Smear 01/30/23 normal   12/10/22 Mammogram was normal.   Colonoscopy 08/13/23 with external/non-bleeding internal (small, grade II) hemorrhoids; 4 polyps removed - one 3 mm sessile in cecum & three 2-4 mm sessile in sigmoid colon (all found to be benign tubular adenomas); Multiple medium & small-mouthed diverticula found in sigmoid.   Bone Density 07/09/23 with a T-score of -1.3, osteopenic  Labs:     Component Value Date/Time   NA 140 09/29/2023 0909   K 3.9 09/29/2023 0909   CL 100 09/29/2023 0909   CO2 29 09/29/2023 0909   GLUCOSE 101 (H) 09/29/2023 0909   BUN 12 09/29/2023 0909   CREATININE 0.62 09/29/2023 0909   CALCIUM 9.7 09/29/2023 0909   PROT 7.1 09/29/2023 0909   ALBUMIN 4.2 01/23/2023 0912   AST 19 09/29/2023 0909   AST 18 01/23/2023 0912   ALT 16 09/29/2023 0909   ALT 14 01/23/2023 0912   ALKPHOS 74 01/23/2023 0912   BILITOT 0.6 09/29/2023 0909   BILITOT 0.4 01/23/2023 0912   GFRNONAA >60 01/23/2023 0912   GFRNONAA 95 06/14/2020 0917   GFRAA 110 06/14/2020 0917    Lab Results  Component Value Date   WBC 11.6 (H) 09/29/2023   HGB 15.0 09/29/2023   HCT 46.4 (H) 09/29/2023   MCV 86.4 09/29/2023   PLT 311 09/29/2023   Lab Results  Component Value Date   CHOL 124 09/29/2023   HDL 38 (L) 09/29/2023   LDLCALC 61 09/29/2023   TRIG 175 (H) 09/29/2023   CHOLHDL 3.3 09/29/2023   Lab Results  Component Value Date   TSH 2.51 09/29/2023   Assessment & Plan:  Other Labs Reviewed today: CBC, compared to 01/23/23: WBC 11.6, elevated further from 11.0; RBC 5.37, decreased from 5.38 but still elevated; HCT 46.4, increased from  46.1. CMP, compared to 01/23/23: Blood Glucose 101, decreased from 111 but still elevated; otherwise WNL TSH: 2.51  woke up this morning with Sinus Congestion and post-nasal drip. Mucus is clear. Denies fever or wheezing. Sending in 100 mg Doxycycline - Take 1 tablet (100 mg total) by mouth 2 (two) times daily   Smoking, 40+ years. Has cut back considerably but continues to smoke. Cessation has previously been discussed with her. Lung cancer screenings recommended, especially due to fhx of lung cancer w/o risk fx. Discussed lung cancer screening - she is agreeable to this, ordering.   Family History of Heart Disease: CT Cardiac Scoring 07/08/22 with a score of 165. Subsequent findings include Mild emphysematous changes but no acute pulmonary findings or worrisome pulmonary lesions; Scattered aortic and coronary artery calcifications; Small hiatal hernia; Aortic atherosclerosis.    Lymphocytosis, followed by Rachel Moulds, MD with oncology every 6 months. Previous flow cytometry showed polyclonal lymphocytosis. No evidence of monoclonal B-cell population.   Hypertension treated with 25 mg Hydrochlorothiazide daily and 50 mg Metoprolol succinate daily. Blood Pressure: normotensive at 130/74.   Hyperlipidemia treated with 20 mg Rosuvastatin daily. 09/29/23 Lipid Panel, compared to 06/24/22: HDL low at 38, decreased from 40, consistent with longstanding history; TRIG 175, decreased from 218 but still elevated.    PAP Smear 01/30/23 normal with repeat recommendation of 2027.  12/10/22 Mammogram was normal. Has a 38% lifetime risk of breast cancer,  with fhx in her sister and hx of fibroadenoma removed from both breasts in the past - has previously discussed Tamoxifen which she has declined, as well as MRI Mammogram screenings. Future mammograms will be contrast-enhanced for better evaluation per Dr. Al Pimple.      Colonoscopy 08/13/23 with external/non-bleeding internal (small, grade II) hemorrhoids; 4 polyps  removed - one 3 mm sessile in cecum & three 2-4 mm sessile in sigmoid colon (all found to be benign tubular adenomas); Multiple medium & small-mouthed diverticula found in sigmoid with repeat recommendation of 2029. Uses Analpram-HC or Anusol-HC suppositories rectal cream to manage hemorrhoids. Takes 1 cap-full of Miralax daily for constipation.   Bone Density 07/09/23 with a T-score of -1.3, osteopenic, with repeat recommendation of 2026. Takes 2000 units Vitamin-D3 daily.   Vaccine Counseling: Due for Covid-19, Tdap, PNA, Shingles; UTD on Flu. Discussed all due vaccinations - declined Covid-19, agreeable to updating PNA when she is not ill  Acute upper respiratory infection: May take Doxycycline 100 mg twice daily for 10 days. Hx of sinusitis.  Please return for follow up in 6 months.    Annual wellness visit done today including the all of the following: Reviewed patient's Family Medical History Reviewed and updated list of patient's medical providers Assessment of cognitive impairment was done Assessed patient's functional ability Established a written schedule for health screening services Health Risk Assessent Completed and Reviewed  Discussed health benefits of physical activity, and encouraged her to engage in regular exercise appropriate for her age and condition.    I,Emily Lagle,acting as a Neurosurgeon for Margaree Mackintosh, MD.,have documented all relevant documentation on the behalf of Margaree Mackintosh, MD,as directed by  Margaree Mackintosh, MD while in the presence of Margaree Mackintosh, MD.   I, Margaree Mackintosh, MD, have reviewed all documentation for this visit. The documentation on 10/18/23 for the exam, diagnosis, procedures, and orders are all accurate and complete.

## 2023-10-05 ENCOUNTER — Ambulatory Visit: Payer: Medicare Other | Admitting: Internal Medicine

## 2023-10-05 VITALS — BP 130/74 | HR 71 | Temp 97.9°F | Resp 16 | Ht 67.0 in | Wt 157.4 lb

## 2023-10-05 DIAGNOSIS — I708 Atherosclerosis of other arteries: Secondary | ICD-10-CM

## 2023-10-05 DIAGNOSIS — I10 Essential (primary) hypertension: Secondary | ICD-10-CM | POA: Diagnosis not present

## 2023-10-05 DIAGNOSIS — F17200 Nicotine dependence, unspecified, uncomplicated: Secondary | ICD-10-CM

## 2023-10-05 DIAGNOSIS — Z0000 Encounter for general adult medical examination without abnormal findings: Secondary | ICD-10-CM | POA: Diagnosis not present

## 2023-10-05 DIAGNOSIS — J069 Acute upper respiratory infection, unspecified: Secondary | ICD-10-CM

## 2023-10-05 DIAGNOSIS — E786 Lipoprotein deficiency: Secondary | ICD-10-CM | POA: Diagnosis not present

## 2023-10-05 DIAGNOSIS — I700 Atherosclerosis of aorta: Secondary | ICD-10-CM

## 2023-10-05 DIAGNOSIS — E782 Mixed hyperlipidemia: Secondary | ICD-10-CM | POA: Diagnosis not present

## 2023-10-05 MED ORDER — DOXYCYCLINE HYCLATE 100 MG PO TABS: 100.0000 mg | ORAL_TABLET | Freq: Two times a day (BID) | ORAL | 0 refills | Status: DC

## 2023-10-15 ENCOUNTER — Other Ambulatory Visit: Payer: Self-pay

## 2023-10-15 DIAGNOSIS — I739 Peripheral vascular disease, unspecified: Secondary | ICD-10-CM

## 2023-10-16 ENCOUNTER — Other Ambulatory Visit: Payer: Self-pay | Admitting: Internal Medicine

## 2023-10-18 ENCOUNTER — Encounter: Payer: Self-pay | Admitting: Internal Medicine

## 2023-10-18 NOTE — Patient Instructions (Addendum)
 It was a pleasure to see you today. Continue same meds and return in 6 months.Have sent in order for lung cancer screening.

## 2023-11-25 ENCOUNTER — Other Ambulatory Visit: Payer: Self-pay | Admitting: Internal Medicine

## 2023-12-03 LAB — HM MAMMOGRAPHY

## 2023-12-04 ENCOUNTER — Encounter: Payer: Self-pay | Admitting: Internal Medicine

## 2023-12-16 ENCOUNTER — Telehealth: Payer: Self-pay | Admitting: Acute Care

## 2023-12-16 DIAGNOSIS — F17210 Nicotine dependence, cigarettes, uncomplicated: Secondary | ICD-10-CM

## 2023-12-16 DIAGNOSIS — Z122 Encounter for screening for malignant neoplasm of respiratory organs: Secondary | ICD-10-CM

## 2023-12-16 DIAGNOSIS — Z87891 Personal history of nicotine dependence: Secondary | ICD-10-CM

## 2023-12-16 NOTE — Telephone Encounter (Signed)
 Lung Cancer Screening Narrative/Criteria Questionnaire (Cigarette Smokers Only- No Cigars/Pipes/vapes)   Kristy Black   SDMV:01/12/2024 at 11:00am with Rice Chamorro        Nov 07, 1955   LDCT: 01/13/2024 at 9:00am at GI    68 y.o.   Phone: [phone]  Lung Screening Narrative (confirm age 93-77 yrs Medicare / 50-80 yrs Private pay insurance)   Insurance information:[employer] mcr   Referring Provider:Dr. Liane Redman   This screening involves an initial phone call with a team member from our program. It is called a shared decision making visit. The initial meeting is required by  insurance and Medicare to make sure you understand the program. This appointment takes about 15-20 minutes to complete. You will complete the screening scan at your scheduled date/time.  This scan takes about 5-10 minutes to complete. You can eat and drink normally before and after the scan.  Criteria questions for Lung Cancer Screening:   Are you a current or former smoker? Current Age began smoking: 68yo   If you are a former smoker, what year did you quit smoking? N/A(within 15 yrs)   To calculate your smoking history, I need an accurate estimate of how many packs of cigarettes you smoked per day and for how many years. (Not just the number of PPD you are now smoking)   Years smoking 49 x Packs per day 3/4 = Pack years 36.75   (at least 20 pack yrs)   (Make sure they understand that we need to know how much they have smoked in the past, not just the number of PPD they are smoking now)  Do you have a personal history of cancer?  No    Do you have a family history of cancer? No  Are you coughing up blood?  No  Have you had unexplained weight loss of 15 lbs or more in the last 6 months? No  It looks like you meet all criteria.  When would be a good time for us  to schedule you for this screening?   Additional information: N/A

## 2024-01-12 ENCOUNTER — Ambulatory Visit: Admitting: Physician Assistant

## 2024-01-12 ENCOUNTER — Encounter: Payer: Self-pay | Admitting: Physician Assistant

## 2024-01-12 DIAGNOSIS — F17210 Nicotine dependence, cigarettes, uncomplicated: Secondary | ICD-10-CM

## 2024-01-12 NOTE — Progress Notes (Signed)
 Virtual Visit via Telephone Note  I connected with Annikah Delacueva on 01/12/24 at  11:00 by telephone and verified that I am speaking with the correct person using two identifiers.  Location: Patient: home Provider: working virtually from home   I discussed the limitations, risks, security and privacy concerns of performing an evaluation and management service by telephone and the availability of in person appointments. I also discussed with the patient that there may be a patient responsible charge related to this service. The patient expressed understanding and agreed to proceed.     Shared Decision Making Visit Lung Cancer Screening Program [phone])   Eligibility: Age 55 Pack Years Smoking History Calculation 36.75 (# packs/per year x # years smoked) Recent History of coughing up blood  No Unexplained weight loss? No ( >Than 15 pounds within the last 6 months ) Prior History Lung / other cancer No (Diagnosis within the last 5 years already requiring surveillance chest CT Scans). Smoking Status Current Smoker  Visit Components: Discussion included one or more decision making aids. Yes Discussion included risk/benefits of screening. Yes Discussion included potential follow up diagnostic testing for abnormal scans. Yes Discussion included meaning and risk of over diagnosis. Yes Discussion included meaning and risk of False Positives. Yes Discussion included meaning of total radiation exposure. Yes  Counseling Included: Importance of adherence to annual lung cancer LDCT screening. Yes Impact of comorbidities on ability to participate in the program. Yes Ability and willingness to under diagnostic treatment: Yes  Smoking Cessation Counseling: Current Smokers:  Discussed importance of smoking cessation. Yes Information about tobacco cessation classes and interventions provided to patient. Yes Symptomatic Patient. No Diagnosis Code: Tobacco Use Z72.0 Asymptomatic Patient  Yes  Counseling (Intermediate counseling: > three minutes counseling) [id number] Information about tobacco cessation classes and interventions provided to patient. Yes Written Order for Lung Cancer Screening with LDCT placed in Epic. Yes (CT Chest Lung Cancer Screening Low Dose W/O CM) [id number] Z12.2-Screening of respiratory organs Z87.891-Personal history of nicotine dependence   I have spent 25 minutes of face to face/ virtual visit  time with the patient discussing the risks and benefits of lung cancer screening. We discussed the above noted topics. We paused at intervals to allow for questions to be asked and answered to ensure understanding.We discussed that the single most powerful action that anyone can take to decrease their risk of developing lung cancer is to quit smoking.  We discussed options for tools to aid in quitting smoking including nicotine replacement therapy, non-nicotine medications, support groups, Quit Smart classes, and behavior modification. We discussed that often times setting smaller, more achievable goals, such as eliminating 1 cigarette a day for a week and then 2 cigarettes a day for a week can be helpful in slowly decreasing the number of cigarettes smoked. I provided  them  with smoking cessation  information  with contact information for community resources, classes, free nicotine replacement therapy, and access to mobile apps, text messaging, and on-line smoking cessation help. I have also provided  them  the office contact information in the event they have any questions. We discussed the time and location of the scan, and that either Pearl Bottcher RN, Deann Exon, RN  or I will call / send a letter with the results within 24-72 hours of receiving them. The patient verbalized understanding of all of  the above and had no further questions upon leaving the office. They have my contact information in the event they have any further questions.  I spent 3 minutes counseling  on smoking cessation and the health risks of continued tobacco abuse.  I explained to the patient that there has been a high incidence of coronary artery disease noted on these exams. I explained that this is a non-gated exam therefore degree or severity cannot be determined. This patient is on statin therapy. I have asked the patient to follow-up with their PCP regarding any incidental finding of coronary artery disease and management with diet or medication as their PCP  feels is clinically indicated. The patient verbalized understanding of the above and had no further questions upon completion of the visit.    Patt Boozer Kyden Potash, PA-C

## 2024-01-12 NOTE — Patient Instructions (Signed)

## 2024-01-13 ENCOUNTER — Ambulatory Visit
Admission: RE | Admit: 2024-01-13 | Discharge: 2024-01-13 | Disposition: A | Discharge status: Home / Self Care | Source: Ambulatory Visit | Attending: Acute Care | Admitting: Acute Care

## 2024-01-13 DIAGNOSIS — F17210 Nicotine dependence, cigarettes, uncomplicated: Secondary | ICD-10-CM

## 2024-01-13 DIAGNOSIS — Z87891 Personal history of nicotine dependence: Secondary | ICD-10-CM

## 2024-01-13 DIAGNOSIS — Z122 Encounter for screening for malignant neoplasm of respiratory organs: Secondary | ICD-10-CM

## 2024-01-21 ENCOUNTER — Other Ambulatory Visit: Payer: Self-pay | Admitting: Internal Medicine

## 2024-01-26 ENCOUNTER — Other Ambulatory Visit: Payer: Self-pay | Admitting: Internal Medicine

## 2024-02-04 ENCOUNTER — Other Ambulatory Visit: Payer: Self-pay

## 2024-02-04 DIAGNOSIS — F17210 Nicotine dependence, cigarettes, uncomplicated: Secondary | ICD-10-CM

## 2024-02-04 DIAGNOSIS — Z122 Encounter for screening for malignant neoplasm of respiratory organs: Secondary | ICD-10-CM

## 2024-02-04 DIAGNOSIS — Z87891 Personal history of nicotine dependence: Secondary | ICD-10-CM

## 2024-03-14 ENCOUNTER — Telehealth: Payer: Self-pay

## 2024-03-14 NOTE — Telephone Encounter (Signed)
 Copied from CRM [id number]. Topic: Clinical - Request for Lab/Test Order >> Mar 14, 2024 11:19 AM Winona R wrote: Pt would like to reschedule her lab appointment for Aug 4th however I do not see any lab orders in chart, please add orders and assist pt with rescheduling. Pt avail July 30th or Aug 1st

## 2024-03-31 ENCOUNTER — Other Ambulatory Visit: Payer: Medicare Other

## 2024-04-01 ENCOUNTER — Other Ambulatory Visit

## 2024-04-01 DIAGNOSIS — I10 Essential (primary) hypertension: Secondary | ICD-10-CM

## 2024-04-01 DIAGNOSIS — E782 Mixed hyperlipidemia: Secondary | ICD-10-CM

## 2024-04-02 LAB — HEPATIC FUNCTION PANEL
AG Ratio: 1.5 (calc) (ref 1.0–2.5)
ALT: 15 U/L (ref 6–29)
AST: 18 U/L (ref 10–35)
Albumin: 4.3 g/dL (ref 3.6–5.1)
Alkaline phosphatase (APISO): 78 U/L (ref 37–153)
Bilirubin, Direct: 0.1 mg/dL (ref 0.0–0.2)
Globulin: 2.8 g/dL (ref 1.9–3.7)
Indirect Bilirubin: 0.5 mg/dL (ref 0.2–1.2)
Total Bilirubin: 0.6 mg/dL (ref 0.2–1.2)
Total Protein: 7.1 g/dL (ref 6.1–8.1)

## 2024-04-02 LAB — LIPID PANEL
Cholesterol: 113 mg/dL (ref <200)
HDL: 37 mg/dL — ABNORMAL LOW (ref >=50)
LDL Cholesterol (Calc): 54 mg/dL
Non-HDL Cholesterol (Calc): 76 mg/dL (ref <130)
Total CHOL/HDL Ratio: 3.1 (calc) (ref <5.0)
Triglycerides: 135 mg/dL (ref <150)

## 2024-04-04 ENCOUNTER — Ambulatory Visit: Payer: Medicare Other | Admitting: Internal Medicine

## 2024-04-05 ENCOUNTER — Encounter: Payer: Self-pay | Admitting: Internal Medicine

## 2024-04-05 ENCOUNTER — Ambulatory Visit (INDEPENDENT_AMBULATORY_CARE_PROVIDER_SITE_OTHER): Admitting: Internal Medicine

## 2024-04-05 VITALS — BP 140/80 | HR 72 | Ht 67.0 in | Wt 161.0 lb

## 2024-04-05 DIAGNOSIS — I10 Essential (primary) hypertension: Secondary | ICD-10-CM | POA: Diagnosis not present

## 2024-04-05 DIAGNOSIS — I70213 Atherosclerosis of native arteries of extremities with intermittent claudication, bilateral legs: Secondary | ICD-10-CM | POA: Diagnosis not present

## 2024-04-05 DIAGNOSIS — F17200 Nicotine dependence, unspecified, uncomplicated: Secondary | ICD-10-CM | POA: Diagnosis not present

## 2024-04-05 DIAGNOSIS — E782 Mixed hyperlipidemia: Secondary | ICD-10-CM | POA: Diagnosis not present

## 2024-04-05 NOTE — Progress Notes (Signed)
 Patient Care Team: Perri [name], [name]  Visit Date: 04/05/24  Subjective:   Chief Complaint  Patient presents with  . Hyperlipidemia  . Hypertension   Patient PI:Kristy Black,Female DOB:24-Mar-1956,68 y.o. FMW:[id number]   68 y.o.Female presents today for 6 months follow-up for Hypertension; Hyperlipidemia. Patient has a past medical history of Smoking. Seen for annual visit on 10/05/2023, in the interim has gotten her Mammogram, and has been seen at Pulmonology.  History of Hypertension treated with Metoprolol  succinate 50 mg daily; Dependent Edema treated with HCTZ 25 mg daily. Blood Pressure: normotensive today at 130/80, did elevate to 140/80 later on.   History of Hyperlipidemia treated with Rosuvastatin  20 mg daily. 04/01/2024 Lipid Panel: HDL 37, decreased form 38 in January 2025; otherwise WNL. 2023 Coronary Calcium  Score: 164.  Reviewed Hepatic Function Panel: WNL  History of Smoking, 40+ years, and continues to smoke. 02/04/2024 Lung Cancer Screening noted scattered tiny pulmonary nodules identified measuring up to maximum volume derived equivalent diameter of 4.7 mm - no suspicious pulmonary nodule or mass, no focal airspace consolidation, and no pleural effusion; Emphysema.   History of Fibrocystic Breast Disease s/p 2 surgeries for fibroadenoma & benign left biopsy 2007. Mammogram 12/03/2023 normal with repeat recommendation for 2026. Discussed contrast-enhanced mammography. Her High Risk of Breast Cancer; Lymphocytosis is followed by Dr. Amber Stalls, Oncologist annually according to [facility], and she was last seen there 08/19/2023. Previous flow cytometry showed polyclonal lymphocytosis. No evidence of monoclonal B-cell population.   Followed by Dr. Lonni Gaskins for Atherosclerosis of Native Arteries, Bilateral Lower Extremities. 09/29/2023 Lower Extremity Doppler Study summary with RIGHT: resting right ankle - brachial index indicates  severe right lower extremity arterial disease. The right toe - brachial index is abnormal. LEFT: resting left ankle - brachial index indicates critical left limb ischemia. The left toe - brachial index is abnormal. She says that she walks around her building every day. She says she can really only walk 150 steps before needing a break.  Vaccine Counseling: Due for Influenza, PNA, Shingrix 1/2, Tdap, and Covid-19 - discussed, she is agreeable to updating Influenza; otherwise she will consider the other vaccinations.  Past Medical History:  Diagnosis Date  . Claudication of gluteal region (HCC) 01/18/2018  . Elevated homocysteine   . Family history of breast cancer 05/20/2021  . Family history of pancreatic cancer 05/20/2021  . GERD (gastroesophageal reflux disease)   . Hyperlipidemia   . Hypertension   . Peripheral arterial disease [facility])     Past Surgical History:  Procedure Laterality Date  . BREAST SURGERY     l breast biopsy  . CESAREAN SECTION    . TUBAL LIGATION     Allergies  Allergen Reactions  . Penicillins Hives    Family History  Problem Relation Age of Onset  . Breast cancer Sister 61  . Leukemia Sister 48  . Lung cancer Maternal Uncle        dx after 50; no smoking hx  . Breast cancer Paternal Aunt 62       x2 paternal aunts  . Pancreatic cancer Paternal Uncle        d. 3  . Cancer Paternal Uncle        unknown type; dx ~50  . Colon cancer Neg Hx   . Rectal cancer Neg Hx   . 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 1 grandchild. 1 adult daughter. She is married. 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   Lives  W/ husband   Caffeine use: 1x/week coffee   Daily tea   Right handed    Review of Systems  Constitutional:  Negative for fever and malaise/fatigue.  HENT:  Negative for congestion.   Eyes:  Negative for blurred vision.  Respiratory:  Negative for cough and  shortness of breath.   Cardiovascular:  Positive for claudication (intermittent). Negative for chest pain, palpitations and leg swelling.  Gastrointestinal:  Negative for vomiting.  Musculoskeletal:  Negative for back pain.  Skin:  Negative for rash.  Neurological:  Negative for loss of consciousness and headaches.     Objective:  Vitals: BP (!) 140/80 (BP Location: Left Arm, Patient Position: Sitting)   Pulse 72   Ht 5' 7 (1.702 m)   Wt 161 lb (73 kg)   SpO2 97%   BMI 25.22 kg/m   Physical Exam Vitals and nursing note reviewed.  Constitutional:      General: She is not in acute distress.    Appearance: Normal appearance. She is not toxic-appearing.  HENT:     Head: Normocephalic and atraumatic.  Cardiovascular:     Rate and Rhythm: Normal rate and regular rhythm. No extrasystoles are present.    Pulses: Normal pulses.     Heart sounds: Normal heart sounds. No murmur heard.    No friction rub. No gallop.  Pulmonary:     Effort: Pulmonary effort is normal. No respiratory distress.     Breath sounds: Normal breath sounds. No wheezing or rales.  Skin:    General: Skin is warm and dry.  Neurological:     Mental Status: She is alert and oriented to person, place, and time. Mental status is at baseline.  Psychiatric:        Mood and Affect: Mood normal.        Behavior: Behavior normal.        Thought Content: Thought content normal.        Judgment: Judgment normal.     Results:  Studies Obtained And Personally Reviewed By Me:  2023 Coronary Calcium  Score: 164.  02/04/2024 Lung Cancer Screening noted scattered tiny pulmonary nodules identified measuring up to maximum volume derived equivalent diameter of 4.7 mm - no suspicious pulmonary nodule or mass, no focal airspace consolidation, and no pleural effusion; Emphysema.   Mammogram 12/03/2023 normal with repeat recommendation for 2026.   09/29/2023 Lower Extremity Doppler Study summary with RIGHT: resting right ankle -  brachial index indicates severe right lower extremity arterial disease. The right toe - brachial index is abnormal. LEFT: resting left ankle - brachial index indicates critical left limb ischemia. The left toe - brachial index is abnormal.   Labs:     Component Value Date/Time   NA 140 09/29/2023 0909   K 3.9 09/29/2023 0909   CL 100 09/29/2023 0909   CO2 29 09/29/2023 0909   GLUCOSE 101 (H) 09/29/2023 0909   BUN 12 09/29/2023 0909   CREATININE 0.62 09/29/2023 0909   CALCIUM  9.7 09/29/2023 0909   PROT 7.1 04/01/2024 0909   ALBUMIN 4.2 01/23/2023 0912   AST 18 04/01/2024 0909   AST 18 01/23/2023 0912   ALT 15 04/01/2024 0909   ALT 14 01/23/2023 0912   ALKPHOS 74 01/23/2023 0912   BILITOT 0.6 04/01/2024 0909   BILITOT 0.4 01/23/2023 0912   GFRNONAA >60 01/23/2023 0912  GFRNONAA 95 06/14/2020 0917   GFRAA 110 06/14/2020 0917    Lab Results  Component Value Date   WBC 11.6 (H) 09/29/2023   HGB 15.0 09/29/2023   HCT 46.4 (H) 09/29/2023   MCV 86.4 09/29/2023   PLT 311 09/29/2023   Lab Results  Component Value Date   CHOL 113 04/01/2024   HDL 37 (L) 04/01/2024   LDLCALC 54 04/01/2024   TRIG 135 04/01/2024   CHOLHDL 3.1 04/01/2024   Lab Results  Component Value Date   TSH 2.51 09/29/2023       Latest Ref Rng & Units 04/01/2024    9:09 AM  Hepatic Function  Total Protein 6.1 - 8.1 g/dL 7.1   AST 10 - 35 U/L 18   ALT 6 - 29 U/L 15   Total Bilirubin 0.2 - 1.2 mg/dL 0.6   Bilirubin, Direct 0.0 - 0.2 mg/dL 0.1    Assessment & Plan:   Hypertension treated with Metoprolol  succinate 50 mg daily. Blood Pressure: normotensive today at 130/80, did elevate to 140/80 later on.   Discussed monitoring blood pressure at home and she is agreeable to reporting a few of her at-home BPs.   Dependent Edema treated with HCTZ 25 mg daily.   Hyperlipidemia treated with Rosuvastatin  20 mg daily. 04/01/2024 Lipid Panel: HDL 37, decreased form 38 in January 2025; otherwise WNL. 2023  Coronary Calcium  Score: 164.  Reviewed Hepatic Function Panel: WNL  Smoking, 40+ years, and continues to smoke. 02/04/2024 Lung Cancer Screening noted scattered tiny pulmonary nodules identified measuring up to maximum volume derived equivalent diameter of 4.7 mm - no suspicious pulmonary nodule or mass, no focal airspace consolidation, and no pleural effusion; Emphysema.   Discussed cessation, though she does not seem interested.   History of Fibrocystic Breast Disease s/p 2 surgeries for fibroadenoma & benign left biopsy 2007. Mammogram 12/03/2023 normal with repeat recommendation for 2026.   Discussed contrast-enhanced mammography due to her breast density.   High Risk of Breast Cancer; Lymphocytosis is followed by Dr. Amber Stalls, Oncologist annually according to [facility], and she was last seen there 08/19/2023. Previous flow cytometry showed polyclonal lymphocytosis. No evidence of monoclonal B-cell population.   Followed by Dr. Lonni Gaskins for Atherosclerosis of Native Arteries, Bilateral Lower Extremities. 09/29/2023 Lower Extremity Doppler Study summary with RIGHT: resting right ankle - brachial index indicates severe right lower extremity arterial disease. The right toe - brachial index is abnormal. LEFT: resting left ankle - brachial index indicates critical left limb ischemia. The left toe - brachial index is abnormal. She walks around her building every day, and says she can really only walk 150 steps before needing a break.  Vaccine Counseling: Due for Influenza, PNA, Shingrix 1/2, Tdap, and Covid-19 . discussed, she is agreeable to updating Influenza; otherwise she will consider the other vaccinations.  Return in 6 months (on 10/06/2024) for annual labs, and then on 10/10/2024 for annual visit, or as needed.    I,Emily Lagle,acting as a Neurosurgeon for Ronal JINNY Hailstone, MD.,have documented all relevant documentation on the behalf of Ronal JINNY Hailstone, MD,as directed by  Ronal JINNY Hailstone, MD while  in the presence of Ronal JINNY Hailstone, MD.   I, Ronal JINNY Hailstone, MD, have reviewed all documentation for this visit. The documentation on 04/05/2024 for the exam, diagnosis, procedures, and orders are all accurate and complete.

## 2024-04-17 NOTE — Patient Instructions (Addendum)
 As always, it was a pleasure to see you today. Please quit smoking. Continue current medications. Triglycerides have normalized with lipid lowering medications. Return for health maintenance exam in 6 months.

## 2024-04-20 ENCOUNTER — Other Ambulatory Visit: Payer: Self-pay | Admitting: Internal Medicine

## 2024-05-31 ENCOUNTER — Other Ambulatory Visit: Payer: Self-pay | Admitting: Internal Medicine

## 2024-06-24 LAB — HEMOGLOBIN A1C: A1c: 6.2

## 2024-07-20 ENCOUNTER — Other Ambulatory Visit: Payer: Self-pay | Admitting: Internal Medicine

## 2024-08-03 ENCOUNTER — Telehealth: Payer: Self-pay | Admitting: Hematology and Oncology

## 2024-08-03 NOTE — Telephone Encounter (Signed)
 I spoke with patient to reschedule appointment from 12/19 to 12/15. Patient aware of date/time.

## 2024-08-15 ENCOUNTER — Inpatient Hospital Stay

## 2024-08-15 ENCOUNTER — Inpatient Hospital Stay: Attending: Hematology and Oncology | Admitting: Hematology and Oncology

## 2024-08-15 ENCOUNTER — Other Ambulatory Visit: Payer: Self-pay

## 2024-08-15 VITALS — BP 149/46 | HR 69 | Temp 97.6°F | Resp 17 | Wt 164.5 lb

## 2024-08-15 DIAGNOSIS — Z9189 Other specified personal risk factors, not elsewhere classified: Secondary | ICD-10-CM | POA: Diagnosis present

## 2024-08-15 DIAGNOSIS — F17210 Nicotine dependence, cigarettes, uncomplicated: Secondary | ICD-10-CM | POA: Insufficient documentation

## 2024-08-15 DIAGNOSIS — Z801 Family history of malignant neoplasm of trachea, bronchus and lung: Secondary | ICD-10-CM | POA: Diagnosis not present

## 2024-08-15 DIAGNOSIS — D72820 Lymphocytosis (symptomatic): Secondary | ICD-10-CM

## 2024-08-15 DIAGNOSIS — Z800 Family history of malignant neoplasm of digestive organs: Secondary | ICD-10-CM | POA: Insufficient documentation

## 2024-08-15 DIAGNOSIS — M8580 Other specified disorders of bone density and structure, unspecified site: Secondary | ICD-10-CM | POA: Diagnosis not present

## 2024-08-15 DIAGNOSIS — Z803 Family history of malignant neoplasm of breast: Secondary | ICD-10-CM | POA: Diagnosis not present

## 2024-08-15 LAB — CMP (CANCER CENTER ONLY)
ALT: 11 U/L (ref 0–44)
AST: 23 U/L (ref 15–41)
Albumin: 4.3 g/dL (ref 3.5–5.0)
Alkaline Phosphatase: 84 U/L (ref 38–126)
Anion gap: 12 (ref 5–15)
BUN: 10 mg/dL (ref 8–23)
CO2: 27 mmol/L (ref 22–32)
Calcium: 9.9 mg/dL (ref 8.9–10.3)
Chloride: 101 mmol/L (ref 98–111)
Creatinine: 0.69 mg/dL (ref 0.44–1.00)
GFR, Estimated: >60 mL/min (ref >60)
Glucose, Bld: 128 mg/dL — ABNORMAL HIGH (ref 70–99)
Potassium: 3.4 mmol/L — ABNORMAL LOW (ref 3.5–5.1)
Sodium: 140 mmol/L (ref 135–145)
Total Bilirubin: 0.6 mg/dL (ref 0.0–1.2)
Total Protein: 7.4 g/dL (ref 6.5–8.1)

## 2024-08-15 LAB — CBC WITH DIFFERENTIAL/PLATELET
Abs Immature Granulocytes: 0.03 K/uL (ref 0.00–0.07)
Basophils Absolute: 0 K/uL (ref 0.0–0.1)
Basophils Relative: 0 %
Eosinophils Absolute: 0.1 K/uL (ref 0.0–0.5)
Eosinophils Relative: 1 %
HCT: 46.4 % — ABNORMAL HIGH (ref 36.0–46.0)
Hemoglobin: 15.7 g/dL — ABNORMAL HIGH (ref 12.0–15.0)
Immature Granulocytes: 0 %
Lymphocytes Relative: 31 %
Lymphs Abs: 3.3 K/uL (ref 0.7–4.0)
MCH: 28.2 pg (ref 26.0–34.0)
MCHC: 33.8 g/dL (ref 30.0–36.0)
MCV: 83.3 fL (ref 80.0–100.0)
Monocytes Absolute: 0.8 K/uL (ref 0.1–1.0)
Monocytes Relative: 8 %
Neutro Abs: 6.3 K/uL (ref 1.7–7.7)
Neutrophils Relative %: 60 %
Platelets: 294 K/uL (ref 150–400)
RBC: 5.57 MIL/uL — ABNORMAL HIGH (ref 3.87–5.11)
RDW: 14.6 % (ref 11.5–15.5)
WBC: 10.6 K/uL — ABNORMAL HIGH (ref 4.0–10.5)
nRBC: 0 % (ref 0.0–0.2)

## 2024-08-15 LAB — LACTATE DEHYDROGENASE: LDH: 162 U/L (ref 105–235)

## 2024-08-15 NOTE — Progress Notes (Signed)
 [facility] CONSULT NOTE  Patient Care Team: Baxley, [name], [name]  CHIEF COMPLAINTS/PURPOSE OF CONSULTATION:    ASSESSMENT & PLAN:   This is a very pleasant 68 year old female patient with hypertension and dyslipidemia initially referred to hematology for evaluation of lymphocytosis.  We also identified that she is at high risk for breast cancer hence we calculated her lifetime risk of breast cancer using TC model and 5-year risk of breast cancer using Gail's model which she has at 38% and 4.6% respectively.  She did not want to consider antiestrogen therapy for prophylaxis.  She wanted to continue intensive screening.  Breast Cancer Surveillance No new concerns on physical examination. Discussed the option of contrast-enhanced mammogram for improved imaging. -Order contrast-enhanced mammogram for end of March 2025. -She is not so sure about MRI's. She might consider one every other yr.  Mild Osteopenia T-score -1.3 on recent bone density scan.   General Health Maintenance -Continue annual screenings as appropriate. -Draw blood for routine labs today. -Follow-up in 1 year if no new concerns arise.   HISTORY OF PRESENTING ILLNESS:   Kristy Black 68 y.o. female is here because for follow-up given her high risk for breast cancer.  Discussed the use of AI scribe software for clinical note transcription with the patient, who gave verbal consent to proceed.  History of Present Illness    The patient, with a high risk of breast cancer, presents for a routine follow-up.   MEDICAL HISTORY:  Past Medical History:  Diagnosis Date   Claudication of gluteal region 01/18/2018   Elevated homocysteine    Family history of breast cancer 05/20/2021   Family history of pancreatic cancer 05/20/2021   GERD (gastroesophageal reflux disease)    Hyperlipidemia    Hypertension    Peripheral arterial disease     SURGICAL HISTORY: Past  Surgical History:  Procedure Laterality Date   BREAST SURGERY     l breast biopsy   CESAREAN SECTION     TUBAL LIGATION      SOCIAL HISTORY: Social History   Socioeconomic History   Marital status: Married    Spouse name: Not on file   Number of children: 1   Years of education: some college   Highest education level: Some college, no degree  Occupational History   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 1 grandchild. 1 adult daughter. She is married. 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   Lives  W/ husband   Caffeine use: 1x/week coffee   Daily tea   Right handed    Social Drivers of Health   Tobacco Use: High Risk (04/05/2024)   Patient History    Smoking Tobacco Use: Every Day    Smokeless Tobacco Use: Never  Passive Exposure: Not on file  Financial Resource Strain: Low Risk (04/01/2024)   Overall Financial Resource Strain (CARDIA)    Difficulty of Paying Living Expenses: Not very hard  Food Insecurity: No Food Insecurity (04/01/2024)   Epic    Worried About Programme Researcher, Broadcasting/film/video in the Last Year: Never true    Ran Out of Food in the Last Year: Never true  Transportation Needs: No Transportation Needs (04/01/2024)   Epic    Lack of Transportation (Medical): No    Lack of Transportation (Non-Medical): No  Physical Activity: Sufficiently Active (04/01/2024)   Exercise Vital Sign    Days  of Exercise per Week: 6 days    Minutes of Exercise per Session: 40 min  Stress: No Stress Concern Present (04/01/2024)   [employer] of Occupational Health - Occupational Stress Questionnaire    Feeling of Stress: Not at all  Social Connections: Moderately Integrated (04/01/2024)   Social Connection and Isolation Panel    Frequency of Communication with Friends and Family: More than three times a week    Frequency of Social Gatherings with Friends and Family: Twice a week    Attends Religious Services: More than 4 times per year    Active Member of Clubs or Organizations: No    Attends Engineer, Structural: Not on file    Marital Status: Married  Catering Manager Violence: Not on file  Depression (PHQ2-9): Low Risk (10/05/2023)   Depression (PHQ2-9)    PHQ-2 Score: 0  Alcohol Screen: Low Risk (04/01/2024)   Alcohol Screen    Last Alcohol Screening Score (AUDIT): 1  Housing: Low Risk (04/01/2024)   Epic    Unable to Pay for Housing in the Last Year: No    Number of Times Moved in the Last Year: 0    Homeless in the Last Year: No  Utilities: Not on file  Health Literacy: Not on file    FAMILY HISTORY: Family History  Problem Relation Age of Onset   Breast cancer Sister 37   Leukemia Sister 77   Lung cancer Maternal Uncle        dx after 28; no smoking hx   Breast cancer Paternal Aunt 43       x2 paternal aunts   Pancreatic cancer Paternal Uncle        d. 58   Cancer Paternal Uncle        unknown type; dx ~50   Colon cancer Neg Hx    Rectal cancer Neg Hx    Stomach cancer Neg Hx     ALLERGIES:  is allergic to penicillins.  MEDICATIONS:  Current Outpatient Medications  Medication Sig Dispense Refill   aspirin EC 81 MG tablet Take 81 mg by mouth daily. Swallow whole.     Cholecalciferol (VITAMIN D3) 2000 units TABS Take 1 tablet by mouth daily.     hydrochlorothiazide  (HYDRODIURIL ) 25 MG tablet Take 1 tablet by mouth once daily 90 tablet 0   hydrocortisone   (ANUSOL -HC) 25 MG suppository Place 1 suppository (25 mg total) rectally at bedtime. 10 suppository 1   ibuprofen (ADVIL,MOTRIN) 200 MG tablet Take 200 mg by mouth every 6 (six) hours as needed.     metoprolol  succinate (TOPROL -XL) 50 MG 24 hr tablet TAKE 1 TABLET BY MOUTH ONCE DAILY WITH MEALS OR  IMMEDIATELY  FOLLOWING  A  MEAL 90 tablet 0   Polyethylene Glycol 3350 (MIRALAX PO) Take by mouth daily.     pramoxine-hydrocortisone  (ANALPRAM -HC) 1-1 %  rectal cream Place 1 Application rectally 2 (two) times daily. 30 g 0   rosuvastatin  (CRESTOR ) 20 MG tablet Take 1 tablet by mouth once daily 90 tablet 3   Zinc 30 MG CAPS Take 30 mg by mouth daily.     No current facility-administered medications for this visit.    PHYSICAL EXAMINATION:  BP (!) 149/46 (BP Location: Left Arm, Patient Position: Sitting) Comment: 1st BP 146/46; 2nd BP 163/50  Pulse 69   Temp 97.6 F (36.4 C) (Temporal)   Resp 17   Wt 164 lb 8 oz (74.6 kg)   SpO2 98%   BMI 25.76 kg/m    ECOG PERFORMANCE STATUS: 0 - Asymptomatic  Physical Exam Constitutional:      Appearance: Normal appearance.  Cardiovascular:     Rate and Rhythm: Normal rate and regular rhythm.     Pulses: Normal pulses.     Heart sounds: Normal heart sounds.  Pulmonary:     Effort: Pulmonary effort is normal.     Breath sounds: Normal breath sounds.  Chest:     Comments: Bilateral breasts inspected. No palpable masses or regional adenopathy Abdominal:     General: Abdomen is flat. Bowel sounds are normal. There is no distension.     Palpations: Abdomen is soft.     Tenderness: There is no abdominal tenderness.  Musculoskeletal:     Cervical back: Normal range of motion and neck supple. No rigidity.  Lymphadenopathy:     Cervical: No cervical adenopathy.  Skin:    General: Skin is warm and dry.  Neurological:     General: No focal deficit present.     Mental Status: She is alert.      LABORATORY DATA:  I have reviewed the data as  listed Lab Results  Component Value Date   WBC 11.6 (H) 09/29/2023   HGB 15.0 09/29/2023   HCT 46.4 (H) 09/29/2023   MCV 86.4 09/29/2023   PLT 311 09/29/2023     Chemistry      Component Value Date/Time   NA 140 09/29/2023 0909   K 3.9 09/29/2023 0909   CL 100 09/29/2023 0909   CO2 29 09/29/2023 0909   BUN 12 09/29/2023 0909   CREATININE 0.62 09/29/2023 0909      Component Value Date/Time   CALCIUM  9.7 09/29/2023 0909   ALKPHOS 74 01/23/2023 0912   AST 18 04/01/2024 0909   AST 18 01/23/2023 0912   ALT 15 04/01/2024 0909   ALT 14 01/23/2023 0912   BILITOT 0.6 04/01/2024 0909   BILITOT 0.4 01/23/2023 0912     Labs from day pending.  RADIOGRAPHIC STUDIES: I have personally reviewed the radiological images as listed and agreed with the findings in the report. No results found.     Amber Stalls, MD 08/15/2024 9:06 AM

## 2024-08-15 NOTE — Progress Notes (Signed)
 Kristy Black  Patient Care Team: Baxley, [name], [name]  CHIEF COMPLAINTS/PURPOSE OF CONSULTATION:  Lymphocytosis  ASSESSMENT & PLAN:   This is a very pleasant 68 year old female patient with hypertension and dyslipidemia initially referred to hematology for evaluation of lymphocytosis.    Assessment and Plan Assessment & Plan High risk for breast cancer Lifetime breast cancer risk exceeds 30%. D - Request [facility] to schedule a contrast mammogram for April 2026. - Provided education on contrast mammogram benefits, including sensitivity in dense tissue  - Advised confirmation of April 2026 mammogram appointment with [facility]. - Discussed reversion to standard mammography if contrast not tolerated.  Lymphocytosis Lymphocytosis.  - Ordered CBC, renal and hepatic function tests, and lactate dehydrogenase for surveillance. - No concerns on ROS or PE today.  Osteopenia Mild osteopenia with regular weight-bearing exercise and compliance with vitamin D3 and calcium . - Reinforced regular weight-bearing exercise. - Encouraged continued daily vitamin D3 supplementation (2000 IU). - Advised on dietary calcium  intake and suggested Tums if insufficient.  HISTORY OF PRESENTING ILLNESS:   Kristy Black 68 y.o. female is here because for follow-up given her high risk for breast cancer.  Discussed the use of AI scribe software for clinical Black transcription with the patient, who gave verbal consent to proceed.  History of Present Illness    The patient, with a high risk of breast cancer, presents for a routine follow-up. She declined tamoxifen.  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  She remains asymptomatic from her chronic lymphocytosis, denying fevers, drenching night sweats,  unintentional weight loss, or recurrent infections. Appetite is preserved. Lymphocyte counts were stable as of January 2025, with repeat laboratory evaluation planned today.  Her most recent mammogram in April 2025 was normal, and annual screening is scheduled for April 2026. She expressed concern regarding contrast mammography due to pre-procedure questions but has previously tolerated contrast dye during CT imaging without adverse effects.  Her November 2024 bone density scan demonstrated mild osteopenia. She engages in regular weight-bearing exercise, consumes dietary calcium , and continues daily vitamin D3 supplementation at 2000 IU.    MEDICAL HISTORY:  Past Medical History:  Diagnosis Date   Claudication of gluteal region 01/18/2018   Elevated homocysteine    Family history of breast cancer 05/20/2021   Family history of pancreatic cancer 05/20/2021   GERD (gastroesophageal reflux disease)    Hyperlipidemia    Hypertension    Peripheral arterial disease     SURGICAL HISTORY: Past Surgical History:  Procedure Laterality Date   BREAST SURGERY     l breast biopsy   CESAREAN SECTION     TUBAL LIGATION      SOCIAL HISTORY: Social History   Socioeconomic History   Marital status: Married    Spouse name: Not on file   Number of children: 1   Years of education: some college   Highest education level: Some college, no degree  Occupational History   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 1 grandchild. 1 adult daughter. She is married. 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   Lives  W/ husband   Caffeine use: 1x/week coffee   Daily tea   Right handed    Social Drivers of Health   Tobacco Use: High Risk (04/05/2024)   Patient History    Smoking Tobacco Use: Every Day    Smokeless Tobacco Use: Never    Passive Exposure: Not on file  Financial Resource Strain: Low Risk (04/01/2024)   Overall Financial Resource Strain (CARDIA)    Difficulty of Paying Living Expenses: Not very hard  Food Insecurity: No Food Insecurity (04/01/2024)   Epic    Worried About Radiation Protection Practitioner of Food in the Last Year: Never true    Ran Out of Food in the Last Year: Never true  Transportation Needs: No Transportation Needs (04/01/2024)   Epic    Lack of Transportation (Medical): No    Lack of Transportation (Non-Medical): No  Physical Activity: Sufficiently Active (04/01/2024)   Exercise Vital Sign    Days of Exercise per Week: 6 days    Minutes of Exercise per Session: 40 min  Stress: No Stress Concern Present (04/01/2024)   [employer] of Occupational Health - Occupational Stress Questionnaire    Feeling of Stress: Not at all  Social Connections: Moderately Integrated (04/01/2024)   Social Connection and Isolation Panel    Frequency of Communication with Friends and Family: More than three times a week    Frequency of Social Gatherings with Friends and Family: Twice a week    Attends Religious Services: More than 4 times per year    Active Member of Clubs or Organizations: No    Attends Engineer, Structural: Not on file    Marital Status: Married  Catering Manager Violence: Not on file  Depression (PHQ2-9): Low Risk (10/05/2023)   Depression (PHQ2-9)    PHQ-2 Score: 0  Alcohol Screen: Low Risk (04/01/2024)   Alcohol Screen    Last Alcohol Screening Score (AUDIT): 1  Housing: Low Risk  (04/01/2024)   Epic    Unable to Pay for Housing in the Last Year: No    Number of Times Moved in the Last Year: 0    Homeless in the Last Year: No  Utilities: Not on file  Health Literacy: Not on file    FAMILY HISTORY: Family History  Problem Relation Age of Onset   Breast cancer Sister 46   Leukemia Sister 10   Lung cancer Maternal Uncle        dx after 79; no smoking hx   Breast cancer Paternal Aunt 50       x2 paternal aunts   Pancreatic cancer Paternal Uncle        d. 6   Cancer Paternal Uncle        unknown type; dx ~50   Colon cancer Neg Hx    Rectal cancer Neg Hx    Stomach cancer Neg Hx     ALLERGIES:  is allergic to penicillins.  MEDICATIONS:  Current Outpatient Medications  Medication Sig Dispense Refill   aspirin EC 81 MG tablet Take 81  mg by mouth daily. Swallow whole.     Cholecalciferol (VITAMIN D3) 2000 units TABS Take 1 tablet by mouth daily.     hydrochlorothiazide  (HYDRODIURIL ) 25 MG tablet Take 1 tablet by mouth once daily 90 tablet 0   hydrocortisone  (ANUSOL -HC) 25 MG suppository Place 1 suppository (25 mg total) rectally at bedtime. 10 suppository 1   ibuprofen (ADVIL,MOTRIN) 200 MG tablet Take 200 mg by mouth every 6 (six) hours as needed.     metoprolol  succinate (TOPROL -XL) 50 MG 24 hr tablet TAKE 1 TABLET BY MOUTH ONCE DAILY WITH MEALS OR  IMMEDIATELY  FOLLOWING  A  MEAL 90 tablet 0   Polyethylene Glycol 3350 (MIRALAX PO) Take by mouth daily.     pramoxine-hydrocortisone  (ANALPRAM -HC) 1-1 % rectal cream Place 1 Application rectally 2 (two) times daily. 30 g 0   rosuvastatin  (CRESTOR ) 20 MG tablet Take 1 tablet by mouth once daily 90 tablet 3   Zinc 30 MG CAPS Take 30 mg by mouth daily.     No current facility-administered medications for this visit.    PHYSICAL EXAMINATION:  BP (!) 149/46 (BP Location: Left Arm, Patient Position: Sitting) Comment: 1st BP 146/46; 2nd BP 163/50  Pulse 69   Temp 97.6 F (36.4 C) (Temporal)   Resp 17   Wt 164  lb 8 oz (74.6 kg)   SpO2 98%   BMI 25.76 kg/m    ECOG PERFORMANCE STATUS: 0 - Asymptomatic  Physical Exam Constitutional:      Appearance: Normal appearance.  Cardiovascular:     Rate and Rhythm: Normal rate and regular rhythm.     Pulses: Normal pulses.     Heart sounds: Normal heart sounds.  Pulmonary:     Effort: Pulmonary effort is normal.     Breath sounds: Normal breath sounds.  Chest:     Comments: Bilateral breasts inspected. No palpable masses or regional adenopathy Abdominal:     General: Abdomen is flat. Bowel sounds are normal. There is no distension.     Palpations: Abdomen is soft.     Tenderness: There is no abdominal tenderness.  Musculoskeletal:     Cervical back: Normal range of motion and neck supple. No rigidity.  Lymphadenopathy:     Cervical: No cervical adenopathy.  Skin:    General: Skin is warm and dry.  Neurological:     General: No focal deficit present.     Mental Status: She is alert.      LABORATORY DATA:  I have reviewed the data as listed Lab Results  Component Value Date   WBC 11.6 (H) 09/29/2023   HGB 15.0 09/29/2023   HCT 46.4 (H) 09/29/2023   MCV 86.4 09/29/2023   PLT 311 09/29/2023     Chemistry      Component Value Date/Time   NA 140 09/29/2023 0909   K 3.9 09/29/2023 0909   CL 100 09/29/2023 0909   CO2 29 09/29/2023 0909   BUN 12 09/29/2023 0909   CREATININE 0.62 09/29/2023 0909      Component Value Date/Time   CALCIUM  9.7 09/29/2023 0909   ALKPHOS 74 01/23/2023 0912   AST 18 04/01/2024 0909   AST 18 01/23/2023 0912   ALT 15 04/01/2024 0909   ALT 14 01/23/2023 0912   BILITOT 0.6 04/01/2024 0909   BILITOT 0.4 01/23/2023 0912      RADIOGRAPHIC STUDIES: I have personally reviewed the radiological images as listed and agreed with the findings in the report. No results found.  Amber Stalls, MD 08/15/2024 9:04 AM

## 2024-08-18 ENCOUNTER — Telehealth: Payer: Self-pay

## 2024-08-18 ENCOUNTER — Other Ambulatory Visit: Payer: Self-pay

## 2024-08-18 DIAGNOSIS — Z9189 Other specified personal risk factors, not elsewhere classified: Secondary | ICD-10-CM

## 2024-08-18 NOTE — Telephone Encounter (Signed)
 Order placed for [facility] MM at [facility] via their order portal. Expected date 12/05/24

## 2024-08-18 NOTE — Telephone Encounter (Signed)
-----   Message from Amber Stalls, MD sent at 08/15/2024  9:14 AM EST ----- Pt may have a normal mammogram scheduled in April at [place], can we changed this to [facility] please.  Thanks,

## 2024-08-19 ENCOUNTER — Ambulatory Visit: Payer: Medicare Other | Admitting: Hematology and Oncology

## 2024-09-20 NOTE — Progress Notes (Unsigned)
 "  69 y.o. G1P1001 postmenopausal female with high risk of breast cancer (declined genetic testing, followed by oncology), tobacco use, VAIN 1 (2021), osteopenia here for annual exam. Married. PCP: Perri [name], MD   She reports no other concerns.  Has PAD that limits her ability to walk more than 263ft at a time, seeing vascular surgery  High risk for breast cancer: Sister with breast cancer at 58, didn't have genetic testing. Died at 35 from leukemia.  TC risk 38%, Gail model 4.6% (2022). Declined chemoprophylaxis and is currently declining MRI.  Urine sample provided: No  Postmenopausal bleeding: none Pelvic discharge or pain: none Breast mass, nipple discharge or skin changes : none Sexually active: no, does have pain with intercourse but not having penetrative intercourse due to health  Last PAP:     Component Value Date/Time   DIAGPAP  01/30/2023 1513    - Negative for intraepithelial lesion or malignancy (NILM)   DIAGPAP  01/16/2022 1032    - Negative for intraepithelial lesion or malignancy (NILM)   DIAGPAP (A) 06/18/2020 1622    - Atypical squamous cells of undetermined significance (ASC-US )   HPVHIGH Negative 01/30/2023 1513   HPVHIGH Negative 01/16/2022 1032   HPVHIGH Positive (A) 06/18/2020 1622   ADEQPAP  01/30/2023 1513    Satisfactory for evaluation. The presence or absence of an   ADEQPAP  01/30/2023 1513    endocervical/transformation zone component cannot be determined because   ADEQPAP of atrophy. 01/30/2023 1513   Last mammogram: 12/03/23 Birads 2, Density C Last DXA: 07/09/23  T score -1.3, normal FRAX Last colonoscopy: 08/13/23 q10y  Exercising: Yes, walking when able Smoker: Yes  Flowsheet Row [facility] Visit [facility]21/2024 [facility]  PHQ-2 Total Score 0    Flowsheet Row Office Visit from 10/05/2023 in [facility] Norleen Perri, MD  PHQ-9 Total Score 0     GYN HISTORY: GCG-MCR High Risk-5+partners, IC >16  OB History   Gravida Para Term Preterm AB Living  1 1 1   1   SAB IAB Ectopic Multiple Live Births      1    # Outcome Date GA Lbr Len/2nd Weight Sex Type Anes PTL Lv  1 Term     F CS-LTranv   LIV   Past Medical History:  Diagnosis Date   Claudication of gluteal region 01/18/2018   Elevated homocysteine    Family history of breast cancer 05/20/2021   Family history of pancreatic cancer 05/20/2021   GERD (gastroesophageal reflux disease)    Hyperlipidemia    Hypertension    Peripheral arterial disease    Past Surgical History:  Procedure Laterality Date   BREAST SURGERY     l breast biopsy   CESAREAN SECTION     TUBAL LIGATION     Medications Ordered Prior to Encounter[1] Social History   Socioeconomic History   Marital status: Married    Spouse name: Not on file   Number of children: 1   Years of education: some college   Highest education level: Some college, no degree  Occupational History   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  Tobacco Use   Smoking status: Every Day    Current packs/day: 0.50    Average packs/day: 0.5 packs/day for 48.1 years (24.0 ttl pk-yrs)    Types: Cigarettes    Start date: 1978   Smokeless tobacco: Never   Tobacco comments:    10 cigarettes/day  Vaping Use   Vaping status: Never Used  Substance and Sexual Activity   Alcohol use: Yes    Comment: socially   Drug use: No   Sexual activity: Not Currently    Partners: Male    Birth control/protection: Post-menopausal  Other Topics Concern   Not on file  Social History Narrative   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 1 grandchild. 1 adult daughter. She is married. 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   Lives  W/ husband   Caffeine use: 1x/week coffee   Daily tea   Right handed    Social Drivers of Health   Tobacco Use: High Risk (09/21/2024)   Patient History    Smoking Tobacco Use: Every Day    Smokeless Tobacco Use: Never    Passive Exposure: Not on file  Financial  Resource Strain: Low Risk (04/01/2024)   Overall Financial Resource Strain (CARDIA)    Difficulty of Paying Living Expenses: Not very hard  Food Insecurity: No Food Insecurity (04/01/2024)   Epic    Worried About Radiation Protection Practitioner of Food in the Last Year: Never true    Ran Out of Food in the Last Year: Never true  Transportation Needs: No Transportation Needs (04/01/2024)   Epic    Lack of Transportation (Medical): No    Lack of Transportation (Non-Medical): No  Physical Activity: Sufficiently Active (04/01/2024)   Exercise Vital Sign    Days of Exercise per Week: 6 days    Minutes of Exercise per Session: 40 min  Stress: No Stress Concern Present (04/01/2024)   [employer] of Occupational Health - Occupational Stress Questionnaire    Feeling of Stress: Not at all  Social Connections: Moderately Integrated (04/01/2024)   Social Connection and Isolation Panel    Frequency of Communication with Friends and Family: More than three times a week    Frequency of Social Gatherings with Friends and Family: Twice a week    Attends Religious Services: More than 4 times per year    Active Member of [employer] or Organizations: No    Attends Engineer, Structural: Not on file    Marital Status: Married  Catering Manager Violence: Not on file  Depression (PHQ2-9): Low Risk (09/21/2024)   Depression (PHQ2-9)    PHQ-2 Score: 0  Alcohol Screen: Low Risk (04/01/2024)   Alcohol Screen    Last Alcohol Screening Score (AUDIT): 1  Housing: Low Risk (04/01/2024)   Epic    Unable to Pay for Housing in the Last Year: No    Number of Times Moved in the Last Year: 0    Homeless in the Last Year: No  Utilities: Not on file  Health Literacy: Not on file   Family History  Problem Relation Age of Onset   Breast cancer Sister 28   Leukemia Sister 66   Lung cancer Maternal Uncle        dx after 53; no smoking hx   Breast cancer Paternal Aunt 16       x2 paternal aunts   Pancreatic cancer Paternal Uncle         d. 3   Cancer Paternal Uncle        unknown type; dx ~50   Colon cancer Neg Hx    Rectal cancer Neg Hx    Stomach cancer Neg Hx    Allergies[2]    PE Today's Vitals   09/21/24 0927  BP: 132/64  Pulse: 66  Temp: 98 F (36.7 C)  TempSrc: Oral  SpO2: 99%  Weight: 164 lb (74.4 kg)  Height: 5' 7.75 (1.721 m)   Body mass index is 25.12 kg/m.  Physical Exam Vitals reviewed. Exam conducted with a chaperone present.  Constitutional:      General: She is not in acute distress.    Appearance: Normal appearance.  HENT:     Head: Normocephalic and atraumatic.     Nose: Nose normal.  Eyes:     Extraocular Movements: Extraocular movements intact.     Conjunctiva/sclera: Conjunctivae normal.  Pulmonary:     Effort: Pulmonary effort is normal.  Chest:     Chest wall: No mass or tenderness.  Breasts:    Right: Normal. No swelling, mass, nipple discharge, skin change or tenderness.     Left: Normal. No swelling, mass, nipple discharge, skin change or tenderness.  Abdominal:     General: There is no distension.     Palpations: Abdomen is soft.     Tenderness: There is no abdominal tenderness.  Genitourinary:    General: Normal vulva.     Exam position: Lithotomy position.     Urethra: No prolapse.     Vagina: Normal. No vaginal discharge or bleeding.     Cervix: Normal. No lesion.     Uterus: Normal. Not enlarged and not tender.      Adnexa: Right adnexa normal and left adnexa normal.     Comments: Small areas of hyperpigmentation around introitus Musculoskeletal:        General: Normal range of motion.     Cervical back: Normal range of motion.  Lymphadenopathy:     Upper Body:     Right upper body: No axillary adenopathy.     Left upper body: No axillary adenopathy.     Lower Body: No right inguinal adenopathy. No left inguinal adenopathy.  Skin:    General: Skin is warm and dry.  Neurological:     General: No focal deficit present.     Mental Status: She is alert.   Psychiatric:        Mood and Affect: Mood normal.        Behavior: Behavior normal.      Assessment and Plan:        GYN exam for high-risk Medicare patient Assessment & Plan: Cervical cancer screening performed according to ASCCP guidelines. Encouraged annual mammogram screening, continue to follow with Dr. Loretha, oncology Colonoscopy UTD DXA UTD Labs and immunizations with her primary Encouraged safe sexual practices as indicated Encouraged healthy lifestyle practices with diet and exercise For patients under 50-70yo, I recommend 1200mg  calcium  daily and 600IU of vitamin D  daily.    At high risk for breast cancer Assessment & Plan: Normal breast exam MMG UTD Continue to follow with oncology   History of vaginal dysplasia Assessment & Plan: VAIN 2021 with normal cotesting x2 following Can discontinue screening at this time    Osteopenia, unspecified location Assessment & Plan: Continue vitamin D +Calcium  Encouraged weight based exercise DXA due Nov 2026   Orders: -     DG Bone Density; Future  Negative depression screening   Vera LULLA Pa, MD      [1]  Current Outpatient Medications on File Prior to Visit  Medication Sig Dispense Refill   aspirin EC 81 MG tablet Take 81 mg by mouth daily. Swallow whole.     Cholecalciferol (VITAMIN D3) 2000 units TABS Take 1 tablet by mouth daily.     hydrochlorothiazide  (HYDRODIURIL ) 25 MG tablet Take 1 tablet by mouth once  daily 90 tablet 0   hydrocortisone  (ANUSOL -HC) 25 MG suppository Place 1 suppository (25 mg total) rectally at bedtime. 10 suppository 1   ibuprofen (ADVIL,MOTRIN) 200 MG tablet Take 200 mg by mouth every 6 (six) hours as needed.     metoprolol  succinate (TOPROL -XL) 50 MG 24 hr tablet TAKE 1 TABLET BY MOUTH ONCE DAILY WITH MEALS OR  IMMEDIATELY  FOLLOWING  A  MEAL 90 tablet 0   Polyethylene Glycol 3350 (MIRALAX PO) Take by mouth daily.     pramoxine-hydrocortisone  (ANALPRAM -HC) 1-1 % rectal cream  Place 1 Application rectally 2 (two) times daily. 30 g 0   rosuvastatin  (CRESTOR ) 20 MG tablet Take 1 tablet by mouth once daily 90 tablet 3   Zinc 30 MG CAPS Take 30 mg by mouth daily.     No current facility-administered medications on file prior to visit.  [2]  Allergies Allergen Reactions   Penicillins Hives   "

## 2024-09-21 ENCOUNTER — Encounter: Payer: Self-pay | Admitting: Obstetrics and Gynecology

## 2024-09-21 ENCOUNTER — Ambulatory Visit (INDEPENDENT_AMBULATORY_CARE_PROVIDER_SITE_OTHER): Admitting: Obstetrics and Gynecology

## 2024-09-21 VITALS — BP 132/64 | HR 66 | Temp 98.0°F | Ht 67.75 in | Wt 164.0 lb

## 2024-09-21 DIAGNOSIS — Z1331 Encounter for screening for depression: Secondary | ICD-10-CM

## 2024-09-21 DIAGNOSIS — Z9189 Other specified personal risk factors, not elsewhere classified: Secondary | ICD-10-CM | POA: Insufficient documentation

## 2024-09-21 DIAGNOSIS — Z87411 Personal history of vaginal dysplasia: Secondary | ICD-10-CM | POA: Insufficient documentation

## 2024-09-21 DIAGNOSIS — M8580 Other specified disorders of bone density and structure, unspecified site: Secondary | ICD-10-CM | POA: Insufficient documentation

## 2024-09-21 NOTE — Assessment & Plan Note (Signed)
 VAIN 2021 with normal cotesting x2 following Can discontinue screening at this time

## 2024-09-21 NOTE — Patient Instructions (Signed)
 For patients under 50-67yo, I recommend 1200mg  calcium  daily and 600IU of vitamin D daily. For patients over 67yo, I recommend 1200mg  calcium  daily and 800IU of vitamin D daily.  Health Maintenance, Female Adopting a healthy lifestyle and getting preventive care are important in promoting health and wellness. Ask your health care provider about: The right schedule for you to have regular tests and exams. Things you can do on your own to prevent diseases and keep yourself healthy. What should I know about diet, weight, and exercise? Eat a healthy diet  Eat a diet that includes plenty of vegetables, fruits, low-fat dairy products, and lean protein. Do not eat a lot of foods that are high in solid fats, added sugars, or sodium. Maintain a healthy weight Body mass index (BMI) is used to identify weight problems. It estimates body fat based on height and weight. Your health care provider can help determine your BMI and help you achieve or maintain a healthy weight. Get regular exercise Get regular exercise. This is one of the most important things you can do for your health. Most adults should: Exercise for at least 150 minutes each week. The exercise should increase your heart rate and make you sweat (moderate-intensity exercise). Do strengthening exercises at least twice a week. This is in addition to the moderate-intensity exercise. Spend less time sitting. Even light physical activity can be beneficial. Watch cholesterol and blood lipids Have your blood tested for lipids and cholesterol at 67 years of age, then have this test every 5 years. Have your cholesterol levels checked more often if: Your lipid or cholesterol levels are high. You are older than 67 years of age. You are at high risk for heart disease. What should I know about cancer screening? Depending on your health history and family history, you may need to have cancer screening at various ages. This may include screening  for: Breast cancer. Cervical cancer. Colorectal cancer. Skin cancer. Lung cancer. What should I know about heart disease, diabetes, and high blood pressure? Blood pressure and heart disease High blood pressure causes heart disease and increases the risk of stroke. This is more likely to develop in people who have high blood pressure readings or are overweight. Have your blood pressure checked: Every 3-5 years if you are 67-67 years of age. Every year if you are 24 years old or older. Diabetes Have regular diabetes screenings. This checks your fasting blood sugar level. Have the screening done: Once every three years after age 62 if you are at a normal weight and have a low risk for diabetes. More often and at a younger age if you are overweight or have a high risk for diabetes. What should I know about preventing infection? Hepatitis B If you have a higher risk for hepatitis B, you should be screened for this virus. Talk with your health care provider to find out if you are at risk for hepatitis B infection. Hepatitis C Testing is recommended for: Everyone born from [birth year] through 1965. Anyone with known risk factors for hepatitis C. Sexually transmitted infections (STIs) Get screened for STIs, including gonorrhea and chlamydia, if: You are sexually active and are younger than 67 years of age. You are older than 67 years of age and your health care provider tells you that you are at risk for this type of infection. Your sexual activity has changed since you were last screened, and you are at increased risk for chlamydia or gonorrhea. Ask your health care provider if  you are at risk. Ask your health care provider about whether you are at high risk for HIV. Your health care provider may recommend a prescription medicine to help prevent HIV infection. If you choose to take medicine to prevent HIV, you should first get tested for HIV. You should then be tested every 3 months for as long as you  are taking the medicine. Osteoporosis and menopause Osteoporosis is a disease in which the bones lose minerals and strength with aging. This can result in bone fractures. If you are 72 years old or older, or if you are at risk for osteoporosis and fractures, ask your health care provider if you should: Be screened for bone loss. Take a calcium  or vitamin D supplement to lower your risk of fractures. Be given hormone replacement therapy (HRT) to treat symptoms of menopause. Follow these instructions at home: Alcohol use Do not drink alcohol if: Your health care provider tells you not to drink. You are pregnant, may be pregnant, or are planning to become pregnant. If you drink alcohol: Limit how much you have to: 0-1 drink a day. Know how much alcohol is in your drink. In the [place] one drink equals one 12 oz bottle of beer (355 mL), one 5 oz glass of wine (148 mL), or one 1 oz glass of hard liquor (44 mL). Lifestyle Do not use any products that contain nicotine or tobacco. These products include cigarettes, chewing tobacco, and vaping devices, such as e-cigarettes. If you need help quitting, ask your health care provider. Do not use street drugs. Do not share needles. Ask your health care provider for help if you need support or information about quitting drugs. General instructions Schedule regular health, dental, and eye exams. Stay current with your vaccines. Tell your health care provider if: You often feel depressed. You have ever been abused or do not feel safe at home. Summary Adopting a healthy lifestyle and getting preventive care are important in promoting health and wellness. Follow your health care provider's instructions about healthy diet, exercising, and getting tested or screened for diseases. Follow your health care provider's instructions on monitoring your cholesterol and blood pressure. This information is not intended to replace advice given to you by your health  care provider. Make sure you discuss any questions you have with your health care provider. Document Revised: 01/07/2021 Document Reviewed: 01/07/2021 Elsevier Patient Education  2024 [employer].

## 2024-09-21 NOTE — Assessment & Plan Note (Signed)
 Normal breast exam MMG UTD Continue to follow with oncology

## 2024-09-21 NOTE — Assessment & Plan Note (Signed)
 Continue vitamin D +Calcium  Encouraged weight based exercise DXA due Nov 2026

## 2024-09-21 NOTE — Assessment & Plan Note (Signed)
 Cervical cancer screening performed according to ASCCP guidelines. Encouraged annual mammogram screening, continue to follow with Dr. Loretha, oncology Colonoscopy UTD DXA UTD Labs and immunizations with her primary Encouraged safe sexual practices as indicated Encouraged healthy lifestyle practices with diet and exercise For patients under 50-67yo, I recommend 1200mg  calcium  daily and 600IU of vitamin D  daily.

## 2024-09-27 NOTE — Progress Notes (Incomplete)
 "  Annual Wellness Visit   Patient Care Team: Baxley, [name], [name]  Visit Date: 09/27/24   No chief complaint on file.  Subjective:  Patient: Kristy Black, Female DOB: [birth year], 69 y.o. MRN: [id number] There were no vitals filed for this visit. Kristy Black is a 69 y.o. Female who presents today for her Annual Wellness Visit. Patient has HTN (hypertension); Hyperlipidemia; GE reflux; Fibrocystic breast disease; History of smoking; Chronic bilateral low back pain with bilateral sciatica; Atherosclerosis of native arteries of extremity with intermittent claudication; PAD (peripheral artery disease); Family history of breast cancer; Family history of pancreatic cancer; Rectal bleeding; At high risk for breast cancer; GYN exam for high-risk Medicare patient; History of vaginal dysplasia; and Osteopenia on their problem list.   History of Smoking, 40+ years. Has cut back considerably but continues to smoke. Cessation has previously been discussed with her. Lung cancer screenings recommended, especially due to fhx of lung cancer w/o risk fx.    Fhx of Heart Disease. CT Cardiac Scoring 07/08/22 with a score of 165. Subsequent findings include Mild emphysematous changes but no acute pulmonary findings or worrisome pulmonary lesions; Scattered aortic and coronary artery calcifications; Small hiatal hernia; Aortic atherosclerosis.   History of Lymphocytosis, followed by Amber Stalls, MD with oncology every 6 months. Previous flow cytometry showed polyclonal lymphocytosis. No evidence of monoclonal B-cell population.    History of Hypertension treated with 25 mg Hydrochlorothiazide  daily and 50 mg Metoprolol  succinate daily. Blood Pressure is *** at ***.     History of Hyperlipidemia treated with 20 mg Rosuvastatin  daily.    Labs ***/***/*** {Labs (Optional):31667}   12/03/2023 Mammogram No mammographic evidence of malignancy. Repeat in one year.     08/13/2023 Colonoscopy Hemorrhoids found on perianal exam. One 3 mm polyp in the cecum. Resected and retrieved. Three 2 to 4 mm polyps in the sigmoid colon. Resected and retrieved. 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 Diverticulosis in the sigmoid colon. Non- bleeding internal hemorrhoids. The examined portion of the ileum was normal. Repeat in 5 years.   Health Maintenance  Topic Date Due   Pneumococcal Vaccine: 50+ Years (1 of 2 - PCV) Never done   Zoster Vaccines- Shingrix (1 of 2) Never done   DTaP/Tdap/Td (2 - Td or Tdap) 02/13/2021   Influenza Vaccine  04/01/2024   COVID-19 Vaccine (3 - 2025-26 season) 05/02/2024   Medicare Annual Wellness (AWV)  10/04/2024   Mammogram  12/02/2024   Lung Cancer Screening  01/12/2025   Colonoscopy  08/12/2033   Bone Density Scan  Completed   Meningococcal B Vaccine  Aged Out   Hepatitis C Screening  Discontinued    {Man or Woman:32389}  Vaccine Counseling: Due for {Vaccines:32291::Influenza}; UTD on {Vaccines:32291::Influenza}  ROS Objective:  Vitals: body mass index is unknown because there is no height or weight on file.There were no vitals filed for this visit. Physical Exam  Current Outpatient Medications  Medication Instructions   aspirin EC 81 mg, Daily   Cholecalciferol (VITAMIN D3) 2000 units TABS 1 tablet, Daily   hydrochlorothiazide  (HYDRODIURIL ) 25 mg, Oral, Daily   hydrocortisone  (ANUSOL -HC) 25 mg, Rectal, Nightly   ibuprofen (ADVIL) 200 mg, Every 6 hours PRN   metoprolol  succinate (TOPROL -XL) 50 MG 24 hr tablet TAKE 1 TABLET BY MOUTH ONCE DAILY WITH MEALS OR  IMMEDIATELY  FOLLOWING  A  MEAL   Polyethylene Glycol 3350 (MIRALAX PO) Daily   pramoxine-hydrocortisone  (ANALPRAM -HC) 1-1 % rectal cream  1 Application, Rectal, 2 times daily   rosuvastatin  (CRESTOR ) 20 mg, Oral, Daily   Zinc 30 mg, Daily   Past Medical History:  Diagnosis Date   Claudication of gluteal region 01/18/2018   Elevated homocysteine    Family  history of breast cancer 05/20/2021   Family history of pancreatic cancer 05/20/2021   GERD (gastroesophageal reflux disease)    Hyperlipidemia    Hypertension    Peripheral arterial disease    Medical/Surgical History Narrative:  Allergic/Intolerant to: Allergies[1]  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   History of left breast fibroadenoma removed in 1995.  Right breast fibroadenoma removed in 2000.  C-section 1991.  Bilateral tubal ligation 1995.  Benign left breast biopsy 2007.   She has had considerable issues with right hip and buttock pain.  She had negative MRI of the LS spine and negative hip films.  Has been followed by vascular surgery for vascular disease.  Had MRI of the LS spine in 2018 that did not show spinal stenosis.  No neural impingement was noted or even disc herniation.  The pain bothers her at night and interferes with her sleep.  She has seen Dr. Gretta, vascular surgeon for evaluation of this issue.  She has claudication issues and has known right common iliac artery occlusion.  Right ABI command index indicates severe right lower extremity arterial disease.  Left ABI indicated severe left lower extremity arterial disease. Past Surgical History:  Procedure Laterality Date   BREAST SURGERY     l breast biopsy   CESAREAN SECTION     TUBAL LIGATION     Family History  Problem Relation Age of Onset   Breast cancer Sister 35   Leukemia Sister 47   Lung cancer Maternal Uncle        dx after 51; no smoking hx   Breast cancer Paternal Aunt 41       x2 paternal aunts   Pancreatic cancer Paternal Uncle        d. 63   Cancer Paternal Uncle        unknown type; dx ~50   Colon cancer Neg Hx    Rectal cancer Neg Hx    Stomach cancer Neg Hx    Family History Narrative: {ELFamHX:31110} Social History   Social History Narrative   She retired from [employer]. 1 grandchild. 1 adult daughter. She is married. Husband is self-employed in the  flooring business.   Lives  W/ husband   Caffeine use: 1x/week coffee   Daily tea   Right handed    Most Recent Health Risks Assessment:   Most Recent Social Determinants of Health (Including Hx of Tobacco, Alcohol, and Drug Use) SDOH Screenings   Food Insecurity: No Food Insecurity (04/01/2024)  Housing: Low Risk (04/01/2024)  Transportation Needs: No Transportation Needs (04/01/2024)  Alcohol Screen: Low Risk (04/01/2024)  Depression (PHQ2-9): Low Risk (09/21/2024)  Financial Resource Strain: Low Risk (04/01/2024)  Physical Activity: Sufficiently Active (04/01/2024)  Social Connections: Moderately Integrated (04/01/2024)  Stress: No Stress Concern Present (04/01/2024)  Tobacco Use: High Risk (09/21/2024)   Social History[2] Most Recent Functional Status Assessment:     No data to display         Most Recent Fall Risk Assessment:    09/21/2024    9:22 AM  Fall Risk   Falls in the past year? 0  Number falls in past yr: 0  Injury with Fall? 0  Risk for fall due  to : No Fall Risks  Follow up Falls evaluation completed   Most Recent Anxiety/Depression Screenings:    09/21/2024    9:23 AM 10/05/2023   11:08 AM  PHQ 2/9 Scores  PHQ - 2 Score 0 0  PHQ- 9 Score  0      Data saved with a previous flowsheet row definition      10/05/2023   11:09 AM  GAD 7 : Generalized Anxiety Score  Nervous, Anxious, on Edge 0   Control/stop worrying 0   Worry too much - different things 0   Trouble relaxing 0   Restless 0   Easily annoyed or irritable 0   Afraid - awful might happen 0   Total GAD 7 Score 0  Anxiety Difficulty Not difficult at all     Data saved with a previous flowsheet row definition   Most Recent Cognitive Screening:     No data to display         Most Recent Vision/Hearing Screenings:No results found. Results:  Studies Obtained And Personally Reviewed By Me: Diabetic Foot Exam - Simple   No data filed     {Imaging, colonoscopy, mammogram, bone density scan,  echocardiogram, heart cath, stress test, CT calcium  score, etc.:32292}  Labs:  CBC w/ Differential Lab Results  Component Value Date   WBC 10.6 (H) 08/15/2024   RBC 5.57 (H) 08/15/2024   HGB 15.7 (H) 08/15/2024   HCT 46.4 (H) 08/15/2024   PLT 294 08/15/2024   MCV 83.3 08/15/2024   MCH 28.2 08/15/2024   MCHC 33.8 08/15/2024   RDW 14.6 08/15/2024   MPV 9.8 09/29/2023   LYMPHSABS 3.3 08/15/2024   MONOABS 0.8 08/15/2024   BASOSABS 0.0 08/15/2024    Comprehensive Metabolic Panel Lab Results  Component Value Date   NA 140 08/15/2024   K 3.4 (L) 08/15/2024   CL 101 08/15/2024   CO2 27 08/15/2024   GLUCOSE 128 (H) 08/15/2024   BUN 10 08/15/2024   CREATININE 0.69 08/15/2024   CALCIUM  9.9 08/15/2024   PROT 7.4 08/15/2024   ALBUMIN 4.3 08/15/2024   AST 23 08/15/2024   ALT 11 08/15/2024   ALKPHOS 84 08/15/2024   BILITOT 0.6 08/15/2024   EGFR 99 09/29/2023   GFRNONAA >60 08/15/2024   Lipid Panel  Lab Results  Component Value Date   CHOL 113 04/01/2024   HDL 37 (L) 04/01/2024   LDLCALC 54 04/01/2024   TRIG 135 04/01/2024   A1c No results found for: HGBA1C  TSH Lab Results  Component Value Date   TSH 2.51 09/29/2023   PSA{PSA (Optional):32132} No results found for any visits on 10/10/24. Assessment & Plan:  No orders of the defined types were placed in this encounter.  No orders of the defined types were placed in this encounter.  Other Labs Reviewed today:    No follow-ups on file.   Annual Wellness Visit done today including the all of the following: Reviewed patient's Family Medical History Reviewed patient's SDOH and reviewed tobacco, alcohol, and drug use.  Reviewed and updated list of patient's medical providers Assessment of cognitive impairment was done Assessed patient's functional ability Established a written schedule for health screening services Health Risk Assessent Completed and Reviewed  Discussed health benefits of physical activity,  and encouraged her to engage in regular exercise appropriate for her age and condition.    I,Makayla C Reid,acting as a scribe for Ronal JINNY Hailstone, MD.,have documented all relevant documentation on the behalf of Ronal  JINNY Hailstone, MD,as directed by  Ronal JINNY Hailstone, MD while in the presence of Ronal JINNY Hailstone, MD.  I, Ronal JINNY Hailstone, MD, have reviewed all documentation for and agree with the above Annual Wellness Visit documentation.  Ronal JINNY Hailstone, MD Internal Medicine 10/10/2024    [1]  Allergies Allergen Reactions   Penicillins Hives  [2]  Social History Tobacco Use   Smoking status: Every Day    Current packs/day: 0.50    Average packs/day: 0.5 packs/day for 48.1 years (24.0 ttl pk-yrs)    Types: Cigarettes    Start date: 1978   Smokeless tobacco: Never   Tobacco comments:    10 cigarettes/day  Vaping Use   Vaping status: Never Used  Substance Use Topics   Alcohol use: Yes    Comment: socially   Drug use: No   "

## 2024-10-06 ENCOUNTER — Other Ambulatory Visit: Payer: Medicare Other

## 2024-10-06 DIAGNOSIS — E786 Lipoprotein deficiency: Secondary | ICD-10-CM

## 2024-10-06 DIAGNOSIS — I10 Essential (primary) hypertension: Secondary | ICD-10-CM

## 2024-10-06 DIAGNOSIS — Z0000 Encounter for general adult medical examination without abnormal findings: Secondary | ICD-10-CM

## 2024-10-06 DIAGNOSIS — E782 Mixed hyperlipidemia: Secondary | ICD-10-CM

## 2024-10-06 DIAGNOSIS — R7302 Impaired glucose tolerance (oral): Secondary | ICD-10-CM

## 2024-10-06 DIAGNOSIS — Z1329 Encounter for screening for other suspected endocrine disorder: Secondary | ICD-10-CM

## 2024-10-07 ENCOUNTER — Other Ambulatory Visit

## 2024-10-07 DIAGNOSIS — F17200 Nicotine dependence, unspecified, uncomplicated: Secondary | ICD-10-CM

## 2024-10-07 DIAGNOSIS — Z0000 Encounter for general adult medical examination without abnormal findings: Secondary | ICD-10-CM

## 2024-10-07 DIAGNOSIS — I70213 Atherosclerosis of native arteries of extremities with intermittent claudication, bilateral legs: Secondary | ICD-10-CM

## 2024-10-07 DIAGNOSIS — I10 Essential (primary) hypertension: Secondary | ICD-10-CM

## 2024-10-07 DIAGNOSIS — E782 Mixed hyperlipidemia: Secondary | ICD-10-CM

## 2024-10-07 DIAGNOSIS — R7302 Impaired glucose tolerance (oral): Secondary | ICD-10-CM

## 2024-10-07 LAB — CBC WITH DIFFERENTIAL/PLATELET
Absolute Lymphocytes: 3641 {cells}/uL (ref 850–3900)
Absolute Monocytes: 806 {cells}/uL (ref 200–950)
Basophils Absolute: 63 {cells}/uL (ref 0–200)
Basophils Relative: 0.5 %
Eosinophils Absolute: 126 {cells}/uL (ref 15–500)
Eosinophils Relative: 1 %
HCT: 49.7 % — ABNORMAL HIGH (ref 35.9–46.0)
Hemoglobin: 16.3 g/dL — ABNORMAL HIGH (ref 11.7–15.5)
MCH: 28.2 pg (ref 27.0–33.0)
MCHC: 32.8 g/dL (ref 31.6–35.4)
MCV: 86 fL (ref 81.4–101.7)
MPV: 9.9 fL (ref 7.5–12.5)
Monocytes Relative: 6.4 %
Neutro Abs: 7963 {cells}/uL — ABNORMAL HIGH (ref 1500–7800)
Neutrophils Relative %: 63.2 %
Platelets: 330 10*3/uL (ref 140–400)
RBC: 5.78 Million/uL — ABNORMAL HIGH (ref 3.80–5.10)
RDW: 13.1 % (ref 11.0–15.0)
Total Lymphocyte: 28.9 %
WBC: 12.6 10*3/uL — ABNORMAL HIGH (ref 3.8–10.8)

## 2024-10-10 ENCOUNTER — Ambulatory Visit: Payer: Medicare Other | Admitting: Internal Medicine

## 2024-11-08 ENCOUNTER — Ambulatory Visit: Admitting: Vascular Surgery

## 2024-11-08 ENCOUNTER — Ambulatory Visit (HOSPITAL_COMMUNITY)

## 2025-08-15 ENCOUNTER — Inpatient Hospital Stay: Admitting: Hematology and Oncology

## 2025-08-15 ENCOUNTER — Inpatient Hospital Stay
